# Patient Record
Sex: Female | Born: 1935 | Race: Black or African American | Hispanic: No | Smoking: Never smoker
Health system: Southern US, Community
[De-identification: ages and names within clinical notes are randomized; demographics above are authoritative.]

## PROBLEM LIST (undated history)

## (undated) DIAGNOSIS — I509 Heart failure, unspecified: Secondary | ICD-10-CM

## (undated) DIAGNOSIS — G4700 Insomnia, unspecified: Secondary | ICD-10-CM

## (undated) DIAGNOSIS — K219 Gastro-esophageal reflux disease without esophagitis: Secondary | ICD-10-CM

## (undated) DIAGNOSIS — I517 Cardiomegaly: Secondary | ICD-10-CM

## (undated) DIAGNOSIS — T7840XA Allergy, unspecified, initial encounter: Secondary | ICD-10-CM

## (undated) DIAGNOSIS — I10 Essential (primary) hypertension: Secondary | ICD-10-CM

## (undated) DIAGNOSIS — R42 Dizziness and giddiness: Secondary | ICD-10-CM

## (undated) HISTORY — DX: Dizziness and giddiness: R42

## (undated) HISTORY — DX: Essential (primary) hypertension: I10

## (undated) HISTORY — DX: Insomnia, unspecified: G47.00

## (undated) HISTORY — DX: Cardiomegaly: I51.7

## (undated) HISTORY — DX: Gastro-esophageal reflux disease without esophagitis: K21.9

## (undated) HISTORY — DX: Allergy, unspecified, initial encounter: T78.40XA

---

## 1998-04-14 DIAGNOSIS — I517 Cardiomegaly: Secondary | ICD-10-CM

## 1998-04-14 HISTORY — DX: Cardiomegaly: I51.7

## 2002-04-14 LAB — HM COLONOSCOPY: HM COLON: NORMAL

## 2004-04-14 DIAGNOSIS — R42 Dizziness and giddiness: Secondary | ICD-10-CM

## 2004-04-14 HISTORY — DX: Dizziness and giddiness: R42

## 2005-02-26 ENCOUNTER — Ambulatory Visit: Payer: Self-pay

## 2006-04-10 ENCOUNTER — Other Ambulatory Visit: Payer: Self-pay

## 2006-04-10 ENCOUNTER — Inpatient Hospital Stay: Payer: Self-pay | Admitting: *Deleted

## 2006-05-10 ENCOUNTER — Emergency Department: Payer: Self-pay | Admitting: Emergency Medicine

## 2006-05-10 ENCOUNTER — Other Ambulatory Visit: Payer: Self-pay

## 2006-07-12 ENCOUNTER — Emergency Department: Payer: Self-pay | Admitting: Internal Medicine

## 2008-01-13 ENCOUNTER — Ambulatory Visit: Payer: Self-pay | Admitting: Internal Medicine

## 2008-01-25 ENCOUNTER — Ambulatory Visit: Payer: Self-pay | Admitting: Internal Medicine

## 2008-02-13 ENCOUNTER — Ambulatory Visit: Payer: Self-pay | Admitting: Internal Medicine

## 2008-03-14 ENCOUNTER — Ambulatory Visit: Payer: Self-pay | Admitting: Internal Medicine

## 2008-04-14 ENCOUNTER — Ambulatory Visit: Payer: Self-pay | Admitting: Internal Medicine

## 2008-07-13 ENCOUNTER — Ambulatory Visit: Payer: Self-pay | Admitting: Internal Medicine

## 2008-07-19 ENCOUNTER — Ambulatory Visit: Payer: Self-pay | Admitting: Internal Medicine

## 2008-08-12 ENCOUNTER — Ambulatory Visit: Payer: Self-pay | Admitting: Internal Medicine

## 2009-04-14 LAB — HM DEXA SCAN

## 2009-05-07 ENCOUNTER — Ambulatory Visit: Payer: Self-pay | Admitting: General Practice

## 2010-04-15 ENCOUNTER — Emergency Department: Payer: Self-pay | Admitting: Emergency Medicine

## 2011-04-16 DIAGNOSIS — E538 Deficiency of other specified B group vitamins: Secondary | ICD-10-CM | POA: Diagnosis not present

## 2011-04-28 DIAGNOSIS — E538 Deficiency of other specified B group vitamins: Secondary | ICD-10-CM | POA: Diagnosis not present

## 2011-04-28 DIAGNOSIS — I10 Essential (primary) hypertension: Secondary | ICD-10-CM | POA: Diagnosis not present

## 2011-04-28 DIAGNOSIS — D638 Anemia in other chronic diseases classified elsewhere: Secondary | ICD-10-CM | POA: Diagnosis not present

## 2011-04-28 DIAGNOSIS — E785 Hyperlipidemia, unspecified: Secondary | ICD-10-CM | POA: Diagnosis not present

## 2011-05-19 DIAGNOSIS — I428 Other cardiomyopathies: Secondary | ICD-10-CM | POA: Diagnosis not present

## 2011-05-19 DIAGNOSIS — I422 Other hypertrophic cardiomyopathy: Secondary | ICD-10-CM | POA: Diagnosis not present

## 2011-05-19 DIAGNOSIS — R0602 Shortness of breath: Secondary | ICD-10-CM | POA: Diagnosis not present

## 2011-05-19 DIAGNOSIS — I4891 Unspecified atrial fibrillation: Secondary | ICD-10-CM | POA: Diagnosis not present

## 2011-06-03 DIAGNOSIS — E538 Deficiency of other specified B group vitamins: Secondary | ICD-10-CM | POA: Diagnosis not present

## 2011-06-24 DIAGNOSIS — E785 Hyperlipidemia, unspecified: Secondary | ICD-10-CM | POA: Diagnosis not present

## 2011-06-24 DIAGNOSIS — D638 Anemia in other chronic diseases classified elsewhere: Secondary | ICD-10-CM | POA: Diagnosis not present

## 2011-06-24 DIAGNOSIS — E538 Deficiency of other specified B group vitamins: Secondary | ICD-10-CM | POA: Diagnosis not present

## 2011-06-24 DIAGNOSIS — I10 Essential (primary) hypertension: Secondary | ICD-10-CM | POA: Diagnosis not present

## 2011-08-11 DIAGNOSIS — I10 Essential (primary) hypertension: Secondary | ICD-10-CM | POA: Diagnosis not present

## 2011-08-11 DIAGNOSIS — E785 Hyperlipidemia, unspecified: Secondary | ICD-10-CM | POA: Diagnosis not present

## 2011-08-11 DIAGNOSIS — E538 Deficiency of other specified B group vitamins: Secondary | ICD-10-CM | POA: Diagnosis not present

## 2011-08-11 DIAGNOSIS — K219 Gastro-esophageal reflux disease without esophagitis: Secondary | ICD-10-CM | POA: Diagnosis not present

## 2011-09-18 DIAGNOSIS — R109 Unspecified abdominal pain: Secondary | ICD-10-CM | POA: Diagnosis not present

## 2011-09-18 DIAGNOSIS — N9489 Other specified conditions associated with female genital organs and menstrual cycle: Secondary | ICD-10-CM | POA: Diagnosis not present

## 2011-09-18 DIAGNOSIS — Z7901 Long term (current) use of anticoagulants: Secondary | ICD-10-CM | POA: Diagnosis not present

## 2011-09-18 DIAGNOSIS — I10 Essential (primary) hypertension: Secondary | ICD-10-CM | POA: Diagnosis not present

## 2011-09-18 DIAGNOSIS — I4891 Unspecified atrial fibrillation: Secondary | ICD-10-CM | POA: Diagnosis not present

## 2011-09-18 DIAGNOSIS — I422 Other hypertrophic cardiomyopathy: Secondary | ICD-10-CM | POA: Diagnosis not present

## 2011-09-18 DIAGNOSIS — Z79899 Other long term (current) drug therapy: Secondary | ICD-10-CM | POA: Diagnosis not present

## 2011-09-23 DIAGNOSIS — E538 Deficiency of other specified B group vitamins: Secondary | ICD-10-CM | POA: Diagnosis not present

## 2011-10-20 DIAGNOSIS — E538 Deficiency of other specified B group vitamins: Secondary | ICD-10-CM | POA: Diagnosis not present

## 2011-11-12 DIAGNOSIS — N8111 Cystocele, midline: Secondary | ICD-10-CM | POA: Diagnosis not present

## 2011-11-17 DIAGNOSIS — E538 Deficiency of other specified B group vitamins: Secondary | ICD-10-CM | POA: Diagnosis not present

## 2011-11-17 DIAGNOSIS — I10 Essential (primary) hypertension: Secondary | ICD-10-CM | POA: Diagnosis not present

## 2011-11-17 DIAGNOSIS — G4700 Insomnia, unspecified: Secondary | ICD-10-CM | POA: Diagnosis not present

## 2011-11-17 DIAGNOSIS — I4891 Unspecified atrial fibrillation: Secondary | ICD-10-CM | POA: Diagnosis not present

## 2011-11-17 DIAGNOSIS — E785 Hyperlipidemia, unspecified: Secondary | ICD-10-CM | POA: Diagnosis not present

## 2011-11-24 ENCOUNTER — Encounter: Payer: Self-pay | Admitting: Obstetrics and Gynecology

## 2011-11-24 DIAGNOSIS — IMO0001 Reserved for inherently not codable concepts without codable children: Secondary | ICD-10-CM | POA: Diagnosis not present

## 2011-11-24 DIAGNOSIS — M25559 Pain in unspecified hip: Secondary | ICD-10-CM | POA: Diagnosis not present

## 2011-11-24 DIAGNOSIS — M6281 Muscle weakness (generalized): Secondary | ICD-10-CM | POA: Diagnosis not present

## 2011-11-26 DIAGNOSIS — IMO0001 Reserved for inherently not codable concepts without codable children: Secondary | ICD-10-CM | POA: Diagnosis not present

## 2011-11-26 DIAGNOSIS — M25559 Pain in unspecified hip: Secondary | ICD-10-CM | POA: Diagnosis not present

## 2011-11-26 DIAGNOSIS — M6281 Muscle weakness (generalized): Secondary | ICD-10-CM | POA: Diagnosis not present

## 2011-12-02 DIAGNOSIS — IMO0001 Reserved for inherently not codable concepts without codable children: Secondary | ICD-10-CM | POA: Diagnosis not present

## 2011-12-02 DIAGNOSIS — M6281 Muscle weakness (generalized): Secondary | ICD-10-CM | POA: Diagnosis not present

## 2011-12-02 DIAGNOSIS — M25559 Pain in unspecified hip: Secondary | ICD-10-CM | POA: Diagnosis not present

## 2011-12-04 DIAGNOSIS — M6281 Muscle weakness (generalized): Secondary | ICD-10-CM | POA: Diagnosis not present

## 2011-12-04 DIAGNOSIS — IMO0001 Reserved for inherently not codable concepts without codable children: Secondary | ICD-10-CM | POA: Diagnosis not present

## 2011-12-04 DIAGNOSIS — M25559 Pain in unspecified hip: Secondary | ICD-10-CM | POA: Diagnosis not present

## 2011-12-08 DIAGNOSIS — M25559 Pain in unspecified hip: Secondary | ICD-10-CM | POA: Diagnosis not present

## 2011-12-08 DIAGNOSIS — IMO0001 Reserved for inherently not codable concepts without codable children: Secondary | ICD-10-CM | POA: Diagnosis not present

## 2011-12-08 DIAGNOSIS — M6281 Muscle weakness (generalized): Secondary | ICD-10-CM | POA: Diagnosis not present

## 2011-12-10 DIAGNOSIS — IMO0001 Reserved for inherently not codable concepts without codable children: Secondary | ICD-10-CM | POA: Diagnosis not present

## 2011-12-10 DIAGNOSIS — M25559 Pain in unspecified hip: Secondary | ICD-10-CM | POA: Diagnosis not present

## 2011-12-10 DIAGNOSIS — M6281 Muscle weakness (generalized): Secondary | ICD-10-CM | POA: Diagnosis not present

## 2011-12-14 ENCOUNTER — Encounter: Payer: Self-pay | Admitting: Obstetrics and Gynecology

## 2011-12-14 DIAGNOSIS — M25559 Pain in unspecified hip: Secondary | ICD-10-CM | POA: Diagnosis not present

## 2011-12-14 DIAGNOSIS — M6281 Muscle weakness (generalized): Secondary | ICD-10-CM | POA: Diagnosis not present

## 2011-12-14 DIAGNOSIS — IMO0001 Reserved for inherently not codable concepts without codable children: Secondary | ICD-10-CM | POA: Diagnosis not present

## 2011-12-18 DIAGNOSIS — E538 Deficiency of other specified B group vitamins: Secondary | ICD-10-CM | POA: Diagnosis not present

## 2012-01-19 DIAGNOSIS — E538 Deficiency of other specified B group vitamins: Secondary | ICD-10-CM | POA: Diagnosis not present

## 2012-01-19 DIAGNOSIS — Z23 Encounter for immunization: Secondary | ICD-10-CM | POA: Diagnosis not present

## 2012-02-19 DIAGNOSIS — E538 Deficiency of other specified B group vitamins: Secondary | ICD-10-CM | POA: Diagnosis not present

## 2012-02-19 DIAGNOSIS — I10 Essential (primary) hypertension: Secondary | ICD-10-CM | POA: Diagnosis not present

## 2012-02-19 DIAGNOSIS — E785 Hyperlipidemia, unspecified: Secondary | ICD-10-CM | POA: Diagnosis not present

## 2012-02-19 DIAGNOSIS — N949 Unspecified condition associated with female genital organs and menstrual cycle: Secondary | ICD-10-CM | POA: Diagnosis not present

## 2012-02-25 DIAGNOSIS — N3941 Urge incontinence: Secondary | ICD-10-CM | POA: Diagnosis not present

## 2012-02-25 DIAGNOSIS — N8111 Cystocele, midline: Secondary | ICD-10-CM | POA: Diagnosis not present

## 2012-04-14 HISTORY — PX: BLADDER REPAIR: SHX76

## 2012-04-15 DIAGNOSIS — N8111 Cystocele, midline: Secondary | ICD-10-CM | POA: Diagnosis not present

## 2012-04-15 DIAGNOSIS — N3941 Urge incontinence: Secondary | ICD-10-CM | POA: Diagnosis not present

## 2012-04-15 DIAGNOSIS — IMO0002 Reserved for concepts with insufficient information to code with codable children: Secondary | ICD-10-CM | POA: Insufficient documentation

## 2012-04-21 DIAGNOSIS — E538 Deficiency of other specified B group vitamins: Secondary | ICD-10-CM | POA: Diagnosis not present

## 2012-05-11 DIAGNOSIS — N390 Urinary tract infection, site not specified: Secondary | ICD-10-CM | POA: Diagnosis not present

## 2012-05-11 DIAGNOSIS — Z01818 Encounter for other preprocedural examination: Secondary | ICD-10-CM | POA: Diagnosis not present

## 2012-05-21 DIAGNOSIS — E538 Deficiency of other specified B group vitamins: Secondary | ICD-10-CM | POA: Diagnosis not present

## 2012-05-25 DIAGNOSIS — N812 Incomplete uterovaginal prolapse: Secondary | ICD-10-CM | POA: Diagnosis not present

## 2012-05-25 DIAGNOSIS — N815 Vaginal enterocele: Secondary | ICD-10-CM | POA: Diagnosis not present

## 2012-05-25 DIAGNOSIS — N8110 Cystocele, unspecified: Secondary | ICD-10-CM | POA: Diagnosis not present

## 2012-06-21 DIAGNOSIS — G4700 Insomnia, unspecified: Secondary | ICD-10-CM | POA: Diagnosis not present

## 2012-06-21 DIAGNOSIS — I10 Essential (primary) hypertension: Secondary | ICD-10-CM | POA: Diagnosis not present

## 2012-06-21 DIAGNOSIS — E538 Deficiency of other specified B group vitamins: Secondary | ICD-10-CM | POA: Diagnosis not present

## 2012-07-16 DIAGNOSIS — E538 Deficiency of other specified B group vitamins: Secondary | ICD-10-CM | POA: Diagnosis not present

## 2012-09-02 DIAGNOSIS — E538 Deficiency of other specified B group vitamins: Secondary | ICD-10-CM | POA: Diagnosis not present

## 2012-09-15 DIAGNOSIS — E538 Deficiency of other specified B group vitamins: Secondary | ICD-10-CM | POA: Diagnosis not present

## 2012-09-16 DIAGNOSIS — I4891 Unspecified atrial fibrillation: Secondary | ICD-10-CM | POA: Diagnosis not present

## 2012-09-16 DIAGNOSIS — I10 Essential (primary) hypertension: Secondary | ICD-10-CM | POA: Diagnosis not present

## 2012-09-16 DIAGNOSIS — Z79899 Other long term (current) drug therapy: Secondary | ICD-10-CM | POA: Diagnosis not present

## 2012-09-16 DIAGNOSIS — I422 Other hypertrophic cardiomyopathy: Secondary | ICD-10-CM | POA: Diagnosis not present

## 2012-09-23 DIAGNOSIS — E785 Hyperlipidemia, unspecified: Secondary | ICD-10-CM | POA: Diagnosis not present

## 2012-09-23 DIAGNOSIS — K219 Gastro-esophageal reflux disease without esophagitis: Secondary | ICD-10-CM | POA: Diagnosis not present

## 2012-09-23 DIAGNOSIS — I10 Essential (primary) hypertension: Secondary | ICD-10-CM | POA: Diagnosis not present

## 2012-09-23 DIAGNOSIS — E538 Deficiency of other specified B group vitamins: Secondary | ICD-10-CM | POA: Diagnosis not present

## 2012-11-04 DIAGNOSIS — E538 Deficiency of other specified B group vitamins: Secondary | ICD-10-CM | POA: Diagnosis not present

## 2012-11-25 DIAGNOSIS — E538 Deficiency of other specified B group vitamins: Secondary | ICD-10-CM | POA: Diagnosis not present

## 2012-12-27 DIAGNOSIS — Z23 Encounter for immunization: Secondary | ICD-10-CM | POA: Diagnosis not present

## 2012-12-27 DIAGNOSIS — Z9181 History of falling: Secondary | ICD-10-CM | POA: Diagnosis not present

## 2012-12-27 DIAGNOSIS — E538 Deficiency of other specified B group vitamins: Secondary | ICD-10-CM | POA: Diagnosis not present

## 2012-12-27 DIAGNOSIS — Z1331 Encounter for screening for depression: Secondary | ICD-10-CM | POA: Diagnosis not present

## 2012-12-27 DIAGNOSIS — D649 Anemia, unspecified: Secondary | ICD-10-CM | POA: Diagnosis not present

## 2012-12-27 DIAGNOSIS — E785 Hyperlipidemia, unspecified: Secondary | ICD-10-CM | POA: Diagnosis not present

## 2012-12-27 DIAGNOSIS — Z862 Personal history of diseases of the blood and blood-forming organs and certain disorders involving the immune mechanism: Secondary | ICD-10-CM | POA: Diagnosis not present

## 2012-12-27 DIAGNOSIS — I10 Essential (primary) hypertension: Secondary | ICD-10-CM | POA: Diagnosis not present

## 2012-12-27 DIAGNOSIS — R195 Other fecal abnormalities: Secondary | ICD-10-CM | POA: Diagnosis not present

## 2013-01-14 DIAGNOSIS — M899 Disorder of bone, unspecified: Secondary | ICD-10-CM | POA: Diagnosis not present

## 2013-01-14 DIAGNOSIS — M7730 Calcaneal spur, unspecified foot: Secondary | ICD-10-CM | POA: Diagnosis not present

## 2013-01-14 DIAGNOSIS — M2010 Hallux valgus (acquired), unspecified foot: Secondary | ICD-10-CM | POA: Diagnosis not present

## 2013-01-14 DIAGNOSIS — M7989 Other specified soft tissue disorders: Secondary | ICD-10-CM | POA: Diagnosis not present

## 2013-01-14 DIAGNOSIS — M2040 Other hammer toe(s) (acquired), unspecified foot: Secondary | ICD-10-CM | POA: Diagnosis not present

## 2013-01-14 DIAGNOSIS — M79609 Pain in unspecified limb: Secondary | ICD-10-CM | POA: Diagnosis not present

## 2013-01-14 DIAGNOSIS — S93336A Other dislocation of unspecified foot, initial encounter: Secondary | ICD-10-CM | POA: Diagnosis not present

## 2013-01-28 DIAGNOSIS — E538 Deficiency of other specified B group vitamins: Secondary | ICD-10-CM | POA: Diagnosis not present

## 2013-02-14 DIAGNOSIS — M2010 Hallux valgus (acquired), unspecified foot: Secondary | ICD-10-CM | POA: Diagnosis not present

## 2013-02-14 DIAGNOSIS — R51 Headache: Secondary | ICD-10-CM | POA: Diagnosis not present

## 2013-02-14 DIAGNOSIS — M19079 Primary osteoarthritis, unspecified ankle and foot: Secondary | ICD-10-CM | POA: Diagnosis not present

## 2013-02-14 DIAGNOSIS — M2040 Other hammer toe(s) (acquired), unspecified foot: Secondary | ICD-10-CM | POA: Diagnosis not present

## 2013-04-15 DIAGNOSIS — E538 Deficiency of other specified B group vitamins: Secondary | ICD-10-CM | POA: Diagnosis not present

## 2013-05-27 DIAGNOSIS — E538 Deficiency of other specified B group vitamins: Secondary | ICD-10-CM | POA: Diagnosis not present

## 2013-07-07 DIAGNOSIS — K219 Gastro-esophageal reflux disease without esophagitis: Secondary | ICD-10-CM | POA: Diagnosis not present

## 2013-07-07 DIAGNOSIS — G4700 Insomnia, unspecified: Secondary | ICD-10-CM | POA: Diagnosis not present

## 2013-07-07 DIAGNOSIS — E785 Hyperlipidemia, unspecified: Secondary | ICD-10-CM | POA: Diagnosis not present

## 2013-07-07 DIAGNOSIS — E538 Deficiency of other specified B group vitamins: Secondary | ICD-10-CM | POA: Diagnosis not present

## 2013-09-12 DIAGNOSIS — E538 Deficiency of other specified B group vitamins: Secondary | ICD-10-CM | POA: Diagnosis not present

## 2013-09-15 DIAGNOSIS — R0989 Other specified symptoms and signs involving the circulatory and respiratory systems: Secondary | ICD-10-CM | POA: Diagnosis not present

## 2013-09-15 DIAGNOSIS — I422 Other hypertrophic cardiomyopathy: Secondary | ICD-10-CM | POA: Diagnosis not present

## 2013-09-15 DIAGNOSIS — R0609 Other forms of dyspnea: Secondary | ICD-10-CM | POA: Diagnosis not present

## 2013-10-24 DIAGNOSIS — K219 Gastro-esophageal reflux disease without esophagitis: Secondary | ICD-10-CM | POA: Diagnosis not present

## 2013-10-24 DIAGNOSIS — Z79899 Other long term (current) drug therapy: Secondary | ICD-10-CM | POA: Diagnosis not present

## 2013-10-24 DIAGNOSIS — E538 Deficiency of other specified B group vitamins: Secondary | ICD-10-CM | POA: Diagnosis not present

## 2013-10-24 DIAGNOSIS — G4700 Insomnia, unspecified: Secondary | ICD-10-CM | POA: Diagnosis not present

## 2013-10-24 DIAGNOSIS — E785 Hyperlipidemia, unspecified: Secondary | ICD-10-CM | POA: Diagnosis not present

## 2013-10-24 DIAGNOSIS — D649 Anemia, unspecified: Secondary | ICD-10-CM | POA: Diagnosis not present

## 2013-10-24 DIAGNOSIS — I10 Essential (primary) hypertension: Secondary | ICD-10-CM | POA: Diagnosis not present

## 2013-10-24 LAB — LIPID PANEL
Cholesterol: 164 mg/dL (ref 0–200)
HDL: 79 mg/dL — AB (ref 35–70)
LDL Cholesterol: 75 mg/dL
TRIGLYCERIDES: 49 mg/dL (ref 40–160)

## 2013-11-11 DIAGNOSIS — E538 Deficiency of other specified B group vitamins: Secondary | ICD-10-CM | POA: Diagnosis not present

## 2013-12-23 DIAGNOSIS — E785 Hyperlipidemia, unspecified: Secondary | ICD-10-CM | POA: Diagnosis not present

## 2013-12-23 DIAGNOSIS — I10 Essential (primary) hypertension: Secondary | ICD-10-CM | POA: Diagnosis not present

## 2013-12-23 DIAGNOSIS — G4700 Insomnia, unspecified: Secondary | ICD-10-CM | POA: Diagnosis not present

## 2013-12-23 DIAGNOSIS — K219 Gastro-esophageal reflux disease without esophagitis: Secondary | ICD-10-CM | POA: Diagnosis not present

## 2014-01-24 DIAGNOSIS — N3941 Urge incontinence: Secondary | ICD-10-CM | POA: Diagnosis not present

## 2014-01-24 DIAGNOSIS — Z23 Encounter for immunization: Secondary | ICD-10-CM | POA: Diagnosis not present

## 2014-01-24 DIAGNOSIS — R350 Frequency of micturition: Secondary | ICD-10-CM | POA: Diagnosis not present

## 2014-01-24 DIAGNOSIS — I482 Chronic atrial fibrillation: Secondary | ICD-10-CM | POA: Diagnosis not present

## 2014-01-24 DIAGNOSIS — F5104 Psychophysiologic insomnia: Secondary | ICD-10-CM | POA: Diagnosis not present

## 2014-01-24 DIAGNOSIS — I272 Other secondary pulmonary hypertension: Secondary | ICD-10-CM | POA: Diagnosis not present

## 2014-01-24 DIAGNOSIS — I429 Cardiomyopathy, unspecified: Secondary | ICD-10-CM | POA: Diagnosis not present

## 2014-01-24 DIAGNOSIS — E538 Deficiency of other specified B group vitamins: Secondary | ICD-10-CM | POA: Diagnosis not present

## 2014-05-29 DIAGNOSIS — D649 Anemia, unspecified: Secondary | ICD-10-CM | POA: Diagnosis not present

## 2014-05-29 DIAGNOSIS — E538 Deficiency of other specified B group vitamins: Secondary | ICD-10-CM | POA: Diagnosis not present

## 2014-05-29 DIAGNOSIS — E785 Hyperlipidemia, unspecified: Secondary | ICD-10-CM | POA: Diagnosis not present

## 2014-05-29 DIAGNOSIS — I10 Essential (primary) hypertension: Secondary | ICD-10-CM | POA: Diagnosis not present

## 2014-05-29 DIAGNOSIS — I482 Chronic atrial fibrillation: Secondary | ICD-10-CM | POA: Diagnosis not present

## 2014-05-29 DIAGNOSIS — K219 Gastro-esophageal reflux disease without esophagitis: Secondary | ICD-10-CM | POA: Diagnosis not present

## 2014-05-29 DIAGNOSIS — J309 Allergic rhinitis, unspecified: Secondary | ICD-10-CM | POA: Diagnosis not present

## 2014-05-29 DIAGNOSIS — M779 Enthesopathy, unspecified: Secondary | ICD-10-CM | POA: Diagnosis not present

## 2014-05-29 DIAGNOSIS — F5104 Psychophysiologic insomnia: Secondary | ICD-10-CM | POA: Diagnosis not present

## 2014-07-14 DIAGNOSIS — E538 Deficiency of other specified B group vitamins: Secondary | ICD-10-CM | POA: Diagnosis not present

## 2014-09-08 DIAGNOSIS — E538 Deficiency of other specified B group vitamins: Secondary | ICD-10-CM | POA: Diagnosis not present

## 2014-09-27 ENCOUNTER — Encounter: Payer: Self-pay | Admitting: Family Medicine

## 2014-09-27 DIAGNOSIS — R600 Localized edema: Secondary | ICD-10-CM | POA: Insufficient documentation

## 2014-09-27 DIAGNOSIS — G4700 Insomnia, unspecified: Secondary | ICD-10-CM | POA: Insufficient documentation

## 2014-09-27 DIAGNOSIS — M779 Enthesopathy, unspecified: Secondary | ICD-10-CM

## 2014-09-27 DIAGNOSIS — I10 Essential (primary) hypertension: Secondary | ICD-10-CM | POA: Insufficient documentation

## 2014-09-27 DIAGNOSIS — K5730 Diverticulosis of large intestine without perforation or abscess without bleeding: Secondary | ICD-10-CM | POA: Insufficient documentation

## 2014-09-27 DIAGNOSIS — D649 Anemia, unspecified: Secondary | ICD-10-CM | POA: Insufficient documentation

## 2014-09-27 DIAGNOSIS — N812 Incomplete uterovaginal prolapse: Secondary | ICD-10-CM | POA: Insufficient documentation

## 2014-09-27 DIAGNOSIS — R42 Dizziness and giddiness: Secondary | ICD-10-CM | POA: Insufficient documentation

## 2014-09-27 DIAGNOSIS — H9193 Unspecified hearing loss, bilateral: Secondary | ICD-10-CM | POA: Insufficient documentation

## 2014-09-27 DIAGNOSIS — K219 Gastro-esophageal reflux disease without esophagitis: Secondary | ICD-10-CM | POA: Insufficient documentation

## 2014-09-27 DIAGNOSIS — D510 Vitamin B12 deficiency anemia due to intrinsic factor deficiency: Secondary | ICD-10-CM | POA: Insufficient documentation

## 2014-09-27 DIAGNOSIS — N3941 Urge incontinence: Secondary | ICD-10-CM | POA: Insufficient documentation

## 2014-09-27 DIAGNOSIS — M778 Other enthesopathies, not elsewhere classified: Secondary | ICD-10-CM | POA: Insufficient documentation

## 2014-09-27 DIAGNOSIS — J3089 Other allergic rhinitis: Secondary | ICD-10-CM | POA: Insufficient documentation

## 2014-09-27 DIAGNOSIS — E785 Hyperlipidemia, unspecified: Secondary | ICD-10-CM | POA: Insufficient documentation

## 2014-09-27 DIAGNOSIS — M8580 Other specified disorders of bone density and structure, unspecified site: Secondary | ICD-10-CM | POA: Insufficient documentation

## 2014-09-27 DIAGNOSIS — D509 Iron deficiency anemia, unspecified: Secondary | ICD-10-CM | POA: Insufficient documentation

## 2014-09-27 DIAGNOSIS — Z79899 Other long term (current) drug therapy: Secondary | ICD-10-CM | POA: Insufficient documentation

## 2014-09-27 DIAGNOSIS — R102 Pelvic and perineal pain: Secondary | ICD-10-CM | POA: Insufficient documentation

## 2014-09-27 DIAGNOSIS — N814 Uterovaginal prolapse, unspecified: Secondary | ICD-10-CM | POA: Insufficient documentation

## 2014-09-27 DIAGNOSIS — E559 Vitamin D deficiency, unspecified: Secondary | ICD-10-CM | POA: Insufficient documentation

## 2014-09-27 DIAGNOSIS — I340 Nonrheumatic mitral (valve) insufficiency: Secondary | ICD-10-CM | POA: Insufficient documentation

## 2014-09-27 DIAGNOSIS — E538 Deficiency of other specified B group vitamins: Secondary | ICD-10-CM | POA: Insufficient documentation

## 2014-09-27 DIAGNOSIS — M25519 Pain in unspecified shoulder: Secondary | ICD-10-CM | POA: Insufficient documentation

## 2014-09-27 DIAGNOSIS — K648 Other hemorrhoids: Secondary | ICD-10-CM | POA: Insufficient documentation

## 2014-09-27 DIAGNOSIS — I480 Paroxysmal atrial fibrillation: Secondary | ICD-10-CM | POA: Insufficient documentation

## 2014-09-27 DIAGNOSIS — I272 Pulmonary hypertension, unspecified: Secondary | ICD-10-CM | POA: Insufficient documentation

## 2014-09-28 ENCOUNTER — Encounter (INDEPENDENT_AMBULATORY_CARE_PROVIDER_SITE_OTHER): Payer: Self-pay

## 2014-09-28 ENCOUNTER — Encounter: Payer: Self-pay | Admitting: Family Medicine

## 2014-09-28 ENCOUNTER — Ambulatory Visit (INDEPENDENT_AMBULATORY_CARE_PROVIDER_SITE_OTHER): Payer: Medicare Other | Admitting: Family Medicine

## 2014-09-28 VITALS — BP 128/70 | HR 65 | Temp 98.2°F | Resp 14 | Ht 65.0 in | Wt 165.0 lb

## 2014-09-28 DIAGNOSIS — J309 Allergic rhinitis, unspecified: Secondary | ICD-10-CM | POA: Diagnosis not present

## 2014-09-28 DIAGNOSIS — R1031 Right lower quadrant pain: Secondary | ICD-10-CM | POA: Diagnosis not present

## 2014-09-28 DIAGNOSIS — D510 Vitamin B12 deficiency anemia due to intrinsic factor deficiency: Secondary | ICD-10-CM | POA: Diagnosis not present

## 2014-09-28 DIAGNOSIS — Z9181 History of falling: Secondary | ICD-10-CM | POA: Diagnosis not present

## 2014-09-28 DIAGNOSIS — I10 Essential (primary) hypertension: Secondary | ICD-10-CM

## 2014-09-28 DIAGNOSIS — I482 Chronic atrial fibrillation: Secondary | ICD-10-CM | POA: Diagnosis not present

## 2014-09-28 DIAGNOSIS — E785 Hyperlipidemia, unspecified: Secondary | ICD-10-CM | POA: Diagnosis not present

## 2014-09-28 DIAGNOSIS — D649 Anemia, unspecified: Secondary | ICD-10-CM

## 2014-09-28 DIAGNOSIS — Z79899 Other long term (current) drug therapy: Secondary | ICD-10-CM

## 2014-09-28 DIAGNOSIS — I4821 Permanent atrial fibrillation: Secondary | ICD-10-CM

## 2014-09-28 DIAGNOSIS — J3089 Other allergic rhinitis: Secondary | ICD-10-CM

## 2014-09-28 MED ORDER — FLUTICASONE PROPIONATE 50 MCG/ACT NA SUSP: 2.0000 | Freq: Every day | NASAL | Status: DC

## 2014-09-28 NOTE — Progress Notes (Signed)
Name: Sharon Bullock   MRN: [id number]    DOB: November 18, 1933   Date:09/28/2014       Progress Note  Subjective  Chief Complaint  Chief Complaint  Patient presents with  . Hypertension  . Hyperlipidemia    muscle pain  . Insomnia    avg sleep 5 hrs  . Nasal Congestion    chest imrproving 2 months  . Abdominal Pain    RLQ 1 month improving dull ache    HPI  HTN: she takes medications as prescribed, denies side effects except for mild lower extremity swelling. No chest pain, no palpitation  Hyperlipidemia: she is due for repeat labs, she is taking Atorvastatin , denies side effects  Insomnia: she states Ambien is working well for her, occasionally has difficulty falling asleep when stressed.  Post-nasal drainage: she has a long history of AR and has noticed increasing in post-nasal drainage. She has a nasal spray and claritin at home but she has not been using either one of them. Drainage is clear to white color. No fever, no wheezing or SBO.  Abdomina pain: she was on top of a stool to grab something from the pantry, and fell down, did not hip the floor, but the hit the door. She also has been working on a floor bed and right leg and RLQ abdominal pain since than. She denies change in bowel movement, blood or mucus in stools, no change in appetite  Patient Active Problem List   Diagnosis Date Noted  . Pernicious anemia 09/27/2014  . Atrial fibrillation, permanent 09/27/2014  . Benign essential HTN 09/27/2014  . Allergic rhinitis 09/27/2014  . Chronic anemia 09/27/2014  . Insomnia, persistent 09/27/2014  . Hypertensive pulmonary vascular disease 09/27/2014  . B12 deficiency 09/27/2014  . Diverticulosis of colon 09/27/2014  . Dyslipidemia 09/27/2014  . Edema extremities 09/27/2014  . Gastric reflux 09/27/2014  . Bilateral hearing loss 09/27/2014  . Polypharmacy 09/27/2014  . Pelvic pain in female 09/27/2014  . MI (mitral incompetence) 09/27/2014  . Internal hemorrhoids  09/27/2014  . Osteopenia 09/27/2014  . Arthralgia of shoulder 09/27/2014  . Finger tendinitis 09/27/2014  . Urge incontinence 09/27/2014  . Cervical prolapse 09/27/2014  . Vertigo 09/27/2014  . Vitamin D deficiency 09/27/2014  . Bladder cystocele 04/15/2012  . Asymmetric septal hypertrophy 05/19/2011    Past Surgical History  Procedure Laterality Date  . Bladder repair  2014    tact    Family History  Problem Relation Age of Onset  . Alzheimer's disease Mother   . Hypertension Mother   . Stroke Father   . Heart disease Father   . Heart disease Brother   . Hypertension Brother   . Diabetes Brother   . Cancer Daughter     breast  . Hypertension Daughter     History   Social History  . Marital Status: Married    Spouse Name: N/A  . Number of Children: N/A  . Years of Education: N/A   Occupational History  . Not on file.   Social History Main Topics  . Smoking status: Never Smoker   . Smokeless tobacco: Never Used  . Alcohol Use: No  . Drug Use: No  . Sexual Activity: Not Currently   Other Topics Concern  . Not on file   Social History Narrative  . No narrative on file     Current outpatient prescriptions:  .  amiodarone (PACERONE) 200 MG tablet, Take 1 tablet by mouth daily., Disp: , Rfl:  .  amLODipine (NORVASC) 10 MG tablet, Take 1 tablet by mouth daily., Disp: , Rfl:  .  atorvastatin (LIPITOR) 80 MG tablet, Take 1 tablet by mouth as needed., Disp: , Rfl:  .  carvedilol (COREG) 25 MG tablet, Take 1 tablet by mouth 2 (two) times daily., Disp: , Rfl:  .  folic acid (FOLVITE) 400 MCG tablet, Take 1 tablet by mouth daily., Disp: , Rfl:  .  gabapentin (NEURONTIN) 300 MG capsule, Take 1 capsule by mouth daily., Disp: , Rfl:  .  irbesartan (AVAPRO) 300 MG tablet, Take 1 tablet by mouth daily., Disp: , Rfl:  .  loratadine (CLARITIN) 10 MG tablet, Take 1 tablet by mouth as needed., Disp: , Rfl:  .  PRADAXA 150 MG CAPS capsule, Take 1 tablet by mouth 2 (two)  times daily., Disp: , Rfl:  .  ranitidine (ZANTAC) 150 MG tablet, Take 1 tablet by mouth as needed., Disp: , Rfl:  .  Vitamin D, Cholecalciferol, 1000 UNITS TABS, Take 1 tablet by mouth daily., Disp: , Rfl:  .  zolpidem (AMBIEN) 10 MG tablet, Take 1 tablet by mouth at bedtime., Disp: , Rfl:   Allergies  Allergen Reactions  . Nickel Dermatitis and Swelling  . Other Anaphylaxis    Uncoded Allergy. Allergen: SHELLFISH  . Ace Inhibitors Cough     ROS  Constitutional: Negative for fever or weight change.  Respiratory: Negative for cough and shortness of breath  HEENT: postnasal drainage and nasal congestion   Cardiovascular: Negative for chest pain or palpitations. Lower extremity edema Gastrointestinal: positive  for abdominal pain, no bowel changes.  Musculoskeletal: Negative for gait problem or joint swelling.  Skin: Negative for rash.  Neurological: Negative for dizziness or headache.  No other specific complaints in a complete review of systems (except as listed in HPI above).   Objective  Filed Vitals:   09/28/14 1008  BP: 128/70  Pulse: 65  Temp: 98.2 F (36.8 C)  TempSrc: Oral  Resp: 14  Height:  (1.651 m)  Weight: 165 lb (74.844 kg)  SpO2: 98%    Body mass index is 27.46 kg/(m^2).  Physical Exam   Constitutional: Patient appears well-developed and well-nourished. No distress.  HENT: Head: Normocephalic and atraumatic. Nose: Boggy turbinates, no tenderness during percussion of sinusis. Mouth/Throat: Oropharynx is clear and moist. No oropharyngeal exudate.  Eyes: Conjunctivae and EOM are normal. Pupils are equal, round, and reactive to light. No scleral icterus.  Neck: Normal range of motion. Neck supple. No JVD present. No thyromegaly present.  Cardiovascular: Normal rate, regular rhythm , systolic holosystolic murmur 3/6. No BLE edema. Pulmonary/Chest: Effort normal and breath sounds normal. No respiratory distress. Abdominal: Soft. Bowel sounds are  normal, no distension. There is  no masses. Point tenderness on RLQ - reproducible with palpation, also with leg elevation, seems muscular  Musculoskeletal: Normal range of motion, no joint effusions. No gross deformities Neurological: he is alert and oriented to person, place, and time. No cranial nerve deficit. Coordination, balance, strength, speech and gait are normal.  Skin: Skin is warm and dry. No rash noted. No erythema.  Psychiatric: Patient has a normal mood and affect. behavior is normal. Judgment and thought content normal. PHQ2/9: Depression screen PHQ 2/9 09/28/2014  Decreased Interest 0  Down, Depressed, Hopeless 0  PHQ - 2 Score 0     Fall Risk: Fall Risk  09/28/2014  Falls in the past year? Yes  Number falls in past yr: 1  Injury with Fall? No  Assessment & Plan    1. Benign essential HTN At goal, continue medication - Comprehensive Metabolic Panel (CMET)  2. History of recent fall Avoid climbing stools/ladders , night light, remove lose rugs  3. Abdominal wall pain in right lower quadrant Getting better, reassurance, use a stool to work on the yard 4. Polypharmacy Reviewed medications  5. Dyslipidemia  - Lipid Profile  6. Chronic anemia Continue Ambien  7. Pernicious anemia Continue B12 injections  8. Atrial fibrillation, permanent Stable, rhythm controlled   9. Perennial allergic rhinitis Resume medications - fluticasone (FLONASE) 50 MCG/ACT nasal spray; Place 2 sprays into both nostrils daily.  Dispense: 16 g; Refill: 5

## 2014-09-29 LAB — COMPREHENSIVE METABOLIC PANEL
A/G RATIO: 2 (ref 1.1–2.5)
ALT: 15 IU/L (ref 0–32)
AST: 24 IU/L (ref 0–40)
Albumin: 4.6 g/dL (ref 3.5–4.8)
Alkaline Phosphatase: 73 IU/L (ref 39–117)
BUN / CREAT RATIO: 19 (ref 11–26)
BUN: 19 mg/dL (ref 8–27)
Bilirubin Total: 0.4 mg/dL (ref 0.0–1.2)
CO2: 25 mmol/L (ref 18–29)
Calcium: 9.5 mg/dL (ref 8.7–10.3)
Chloride: 104 mmol/L (ref 97–108)
Creatinine, Ser: 1 mg/dL (ref 0.57–1.00)
GFR, EST AFRICAN AMERICAN: 62 mL/min/{1.73_m2} (ref >59)
GFR, EST NON AFRICAN AMERICAN: 54 mL/min/{1.73_m2} — AB (ref >59)
GLOBULIN, TOTAL: 2.3 g/dL (ref 1.5–4.5)
Glucose: 83 mg/dL (ref 65–99)
Potassium: 4.4 mmol/L (ref 3.5–5.2)
Sodium: 142 mmol/L (ref 134–144)
Total Protein: 6.9 g/dL (ref 6.0–8.5)

## 2014-09-29 LAB — LIPID PANEL
CHOLESTEROL TOTAL: 169 mg/dL (ref 100–199)
Chol/HDL Ratio: 2.1 ratio units (ref 0.0–4.4)
HDL: 79 mg/dL (ref >39)
LDL Calculated: 79 mg/dL (ref 0–99)
Triglycerides: 53 mg/dL (ref 0–149)
VLDL Cholesterol Cal: 11 mg/dL (ref 5–40)

## 2014-11-18 ENCOUNTER — Other Ambulatory Visit: Payer: Self-pay | Admitting: Family Medicine

## 2014-11-22 ENCOUNTER — Other Ambulatory Visit: Payer: Self-pay | Admitting: Family Medicine

## 2014-12-08 ENCOUNTER — Other Ambulatory Visit: Payer: Self-pay | Admitting: Family Medicine

## 2014-12-08 ENCOUNTER — Ambulatory Visit (INDEPENDENT_AMBULATORY_CARE_PROVIDER_SITE_OTHER): Payer: Medicare Other

## 2014-12-08 DIAGNOSIS — E538 Deficiency of other specified B group vitamins: Secondary | ICD-10-CM

## 2014-12-08 MED ORDER — CYANOCOBALAMIN 1000 MCG/ML IJ SOLN
1000.0000 ug | Freq: Once | INTRAMUSCULAR | Status: AC
  Administered 2014-12-08: 1000 ug via INTRAMUSCULAR

## 2014-12-28 DIAGNOSIS — R0609 Other forms of dyspnea: Secondary | ICD-10-CM | POA: Diagnosis not present

## 2014-12-28 DIAGNOSIS — I422 Other hypertrophic cardiomyopathy: Secondary | ICD-10-CM | POA: Diagnosis not present

## 2015-01-03 ENCOUNTER — Ambulatory Visit (INDEPENDENT_AMBULATORY_CARE_PROVIDER_SITE_OTHER): Payer: Medicare Other | Admitting: Family Medicine

## 2015-01-03 ENCOUNTER — Encounter: Payer: Self-pay | Admitting: Family Medicine

## 2015-01-03 VITALS — BP 126/64 | HR 69 | Temp 98.0°F | Resp 16 | Ht 65.0 in | Wt 165.1 lb

## 2015-01-03 DIAGNOSIS — E538 Deficiency of other specified B group vitamins: Secondary | ICD-10-CM

## 2015-01-03 DIAGNOSIS — Z23 Encounter for immunization: Secondary | ICD-10-CM | POA: Diagnosis not present

## 2015-01-03 DIAGNOSIS — J069 Acute upper respiratory infection, unspecified: Secondary | ICD-10-CM | POA: Diagnosis not present

## 2015-01-03 MED ORDER — CYANOCOBALAMIN 1000 MCG/ML IJ SOLN
1000.0000 ug | Freq: Once | INTRAMUSCULAR | Status: AC
  Administered 2015-01-03: 1000 ug via INTRAMUSCULAR

## 2015-01-03 MED ORDER — PREDNISONE 10 MG PO TABS: 10.0000 mg | ORAL_TABLET | Freq: Every day | ORAL | Status: DC

## 2015-01-03 MED ORDER — BENZONATATE 100 MG PO CAPS: 200.0000 mg | ORAL_CAPSULE | Freq: Three times a day (TID) | ORAL | Status: DC | PRN

## 2015-01-03 NOTE — Progress Notes (Signed)
Name: Sharon Bullock   MRN: [id number]    DOB: 1933/10/28   Date:01/03/2015       Progress Note  Subjective  Chief Complaint  Chief Complaint  Patient presents with  . URI    onset 1 week coughing,sneezing,hoarsness,congestion taking OTC meds    HPI  URI: she developed cold symptoms one week ago. Initially with clear rhinorrhea, nasal congestion, scratchy throat, followed by a dry cough, and sometimes clear/white sputum. She also has noticed some hoarseness.  She states appetite has decreased. No fever, but has chills sometimes.  She will be interviewed by a [employer] on Saturday, her daughter is being recognized for being the Technical sales engineer of the first [employer] in [place].  President Obama and President Antonieta Loveless will be there. She states she cannot be sick for that day.   B12 Deficiency: she comes in monthly for B12 injection   Patient Active Problem List   Diagnosis Date Noted  . Pernicious anemia 09/27/2014  . Atrial fibrillation, permanent 09/27/2014  . Benign essential HTN 09/27/2014  . Perennial allergic rhinitis 09/27/2014  . Chronic anemia 09/27/2014  . Insomnia, persistent 09/27/2014  . Hypertensive pulmonary vascular disease 09/27/2014  . B12 deficiency 09/27/2014  . Diverticulosis of colon 09/27/2014  . Dyslipidemia 09/27/2014  . Edema extremities 09/27/2014  . Gastric reflux 09/27/2014  . Bilateral hearing loss 09/27/2014  . Polypharmacy 09/27/2014  . Pelvic pain in female 09/27/2014  . MI (mitral incompetence) 09/27/2014  . Internal hemorrhoids 09/27/2014  . Osteopenia 09/27/2014  . Arthralgia of shoulder 09/27/2014  . Finger tendinitis 09/27/2014  . Urge incontinence 09/27/2014  . Cervical prolapse 09/27/2014  . Vertigo 09/27/2014  . Vitamin D deficiency 09/27/2014  . Bladder cystocele 04/15/2012  . Asymmetric septal hypertrophy 05/19/2011    Past Surgical History  Procedure Laterality Date  . Bladder repair  2014    tact    Family  History  Problem Relation Age of Onset  . Alzheimer's disease Mother   . Hypertension Mother   . Stroke Father   . Heart disease Father   . Heart disease Brother   . Hypertension Brother   . Diabetes Brother   . Cancer Daughter     breast  . Hypertension Daughter     Social History   Social History  . Marital Status: Married    Spouse Name: N/A  . Number of Children: N/A  . Years of Education: N/A   Occupational History  . Not on file.   Social History Main Topics  . Smoking status: Never Smoker   . Smokeless tobacco: Never Used  . Alcohol Use: No  . Drug Use: No  . Sexual Activity: Not Currently   Other Topics Concern  . Not on file   Social History Narrative     Current outpatient prescriptions:  .  amiodarone (PACERONE) 200 MG tablet, Take 1 tablet by mouth daily., Disp: , Rfl:  .  amLODipine (NORVASC) 10 MG tablet, Take 1 tablet by mouth daily., Disp: , Rfl:  .  atorvastatin (LIPITOR) 80 MG tablet, Take 1 tablet by mouth as needed., Disp: , Rfl:  .  benzonatate (TESSALON) 100 MG capsule, Take 2 capsules (200 mg total) by mouth 3 (three) times daily as needed for cough., Disp: 40 capsule, Rfl: 0 .  carvedilol (COREG) 25 MG tablet, TAKE ONE TABLET BY MOUTH TWICE DAILY, Disp: 180 tablet, Rfl: 1 .  fluticasone (FLONASE) 50 MCG/ACT nasal spray, Place 2 sprays into both nostrils  daily., Disp: 16 g, Rfl: 5 .  folic acid (FOLVITE) 400 MCG tablet, Take 1 tablet by mouth daily., Disp: , Rfl:  .  gabapentin (NEURONTIN) 300 MG capsule, Take 1 capsule by mouth daily., Disp: , Rfl:  .  irbesartan (AVAPRO) 300 MG tablet, TAKE ONE TABLET BY MOUTH EVERY DAY FOR FOR BLOOD PRESSURE, Disp: 90 tablet, Rfl: 1 .  loratadine (CLARITIN) 10 MG tablet, Take 1 tablet by mouth as needed., Disp: , Rfl:  .  PRADAXA 150 MG CAPS capsule, Take 1 tablet by mouth 2 (two) times daily., Disp: , Rfl:  .  predniSONE (DELTASONE) 10 MG tablet, Take 1 tablet (10 mg total) by mouth daily with breakfast.,  Disp: 10 tablet, Rfl: 0 .  ranitidine (ZANTAC) 150 MG tablet, Take 1 tablet by mouth as needed., Disp: , Rfl:  .  Vitamin D, Cholecalciferol, 1000 UNITS TABS, Take 1 tablet by mouth daily., Disp: , Rfl:  .  zolpidem (AMBIEN) 10 MG tablet, TAKE ONE TABLET BY MOUTH NIGHTLY AT BEDTIME AS NEEDED FOR SLEEP, Disp: 90 tablet, Rfl: 0  Allergies  Allergen Reactions  . Nickel Dermatitis and Swelling  . Other Anaphylaxis    Uncoded Allergy. Allergen: SHELLFISH  . Ace Inhibitors Cough     ROS  Constitutional: Negative for fever or weight change.  Respiratory: Positive  for cough negative  shortness of breath.   Cardiovascular: Negative for chest pain or palpitations.  Gastrointestinal: Negative for abdominal pain, no bowel changes.  Musculoskeletal: Negative for gait problem or joint swelling.  Skin: Negative for rash.  Neurological: Negative for dizziness or headache.  No other specific complaints in a complete review of systems (except as listed in HPI above).  Objective  Filed Vitals:   01/03/15 0829  BP: 126/64  Pulse: 69  Temp: 98 F (36.7 C)  TempSrc: Oral  Resp: 16  Height:  (1.651 m)  Weight: 165 lb 1.6 oz (74.889 kg)  SpO2: 98%    Body mass index is 27.47 kg/(m^2).  Physical Exam  Constitutional: Patient appears well-developed and well-nourished. Obese  No distress.  HEENT: head atraumatic, normocephalic, pupils equal and reactive to light, ears TM normal , neck supple, throat within normal limits. Nares bulging and clear rhinorrhea Cardiovascular: Normal rate, regular rhythm and normal heart sounds.  No murmur heard. No BLE edema. Pulmonary/Chest: Effort normal and breath sounds normal. No respiratory distress. Abdominal: Soft.  There is no tenderness. Psychiatric: Patient has a normal mood and affect. behavior is normal. Judgment and thought content normal.   PHQ2/9: Depression screen [facility] 2/9 01/03/2015 09/28/2014  Decreased Interest 0 0  Down, Depressed,  Hopeless 0 0  PHQ - 2 Score 0 0     Fall Risk: Fall Risk  01/03/2015 09/28/2014  Falls in the past year? Yes Yes  Number falls in past yr: 1 1  Injury with Fall? No No     Functional Status Survey: Is the patient deaf or have difficulty hearing?: No Does the patient have difficulty seeing, even when wearing glasses/contacts?: Yes (glasses) Does the patient have difficulty concentrating, remembering, or making decisions?: No Does the patient have difficulty walking or climbing stairs?: No Does the patient have difficulty dressing or bathing?: No Does the patient have difficulty doing errands alone such as visiting a doctor's office or shopping?: No    Assessment & Plan  1. Upper respiratory infection We will try prednisone and Benzonate, fluids and rest , hopefully she can feel great for the grand  event on Saturday - predniSONE (DELTASONE) 10 MG tablet; Take 1 tablet (10 mg total) by mouth daily with breakfast.  Dispense: 10 tablet; Refill: 0 - benzonatate (TESSALON) 100 MG capsule; Take 2 capsules (200 mg total) by mouth 3 (three) times daily as needed for cough.  Dispense: 40 capsule; Refill: 0  2. B12 deficiency  - cyanocobalamin ((VITAMIN B-12)) injection 1,000 mcg; Inject 1 mL (1,000 mcg total) into the muscle once.  3. Needs flu shot  - Flu vaccine HIGH DOSE PF (Fluzone High dose)

## 2015-01-29 ENCOUNTER — Ambulatory Visit: Payer: Medicare Other | Admitting: Family Medicine

## 2015-02-21 ENCOUNTER — Other Ambulatory Visit: Payer: Self-pay | Admitting: Family Medicine

## 2015-02-21 NOTE — Telephone Encounter (Signed)
Patient requesting refill. 

## 2015-02-23 ENCOUNTER — Other Ambulatory Visit: Payer: Self-pay | Admitting: Family Medicine

## 2015-02-23 NOTE — Telephone Encounter (Signed)
Appointment made for 04-12-15

## 2015-02-23 NOTE — Telephone Encounter (Signed)
Patient requesting refill. 

## 2015-04-09 ENCOUNTER — Other Ambulatory Visit: Payer: Self-pay | Admitting: Family Medicine

## 2015-04-10 NOTE — Telephone Encounter (Signed)
Patient requesting refill. 

## 2015-04-12 ENCOUNTER — Ambulatory Visit: Payer: Medicare Other | Admitting: Family Medicine

## 2015-04-19 ENCOUNTER — Encounter: Payer: Self-pay | Admitting: Family Medicine

## 2015-04-19 ENCOUNTER — Ambulatory Visit (INDEPENDENT_AMBULATORY_CARE_PROVIDER_SITE_OTHER): Payer: Medicare Other | Admitting: Family Medicine

## 2015-04-19 ENCOUNTER — Ambulatory Visit
Admission: RE | Admit: 2015-04-19 | Discharge: 2015-04-19 | Disposition: A | Discharge status: Home / Self Care | Payer: Medicare Other | Source: Ambulatory Visit | Attending: Family Medicine | Admitting: Family Medicine

## 2015-04-19 VITALS — BP 148/72 | HR 64 | Temp 97.8°F | Resp 12 | Wt 164.5 lb

## 2015-04-19 DIAGNOSIS — I340 Nonrheumatic mitral (valve) insufficiency: Secondary | ICD-10-CM | POA: Diagnosis not present

## 2015-04-19 DIAGNOSIS — R0989 Other specified symptoms and signs involving the circulatory and respiratory systems: Secondary | ICD-10-CM | POA: Insufficient documentation

## 2015-04-19 DIAGNOSIS — I272 Other secondary pulmonary hypertension: Secondary | ICD-10-CM | POA: Diagnosis not present

## 2015-04-19 DIAGNOSIS — I517 Cardiomegaly: Secondary | ICD-10-CM | POA: Diagnosis not present

## 2015-04-19 DIAGNOSIS — J309 Allergic rhinitis, unspecified: Secondary | ICD-10-CM

## 2015-04-19 DIAGNOSIS — G4700 Insomnia, unspecified: Secondary | ICD-10-CM

## 2015-04-19 DIAGNOSIS — J3089 Other allergic rhinitis: Secondary | ICD-10-CM

## 2015-04-19 DIAGNOSIS — I10 Essential (primary) hypertension: Secondary | ICD-10-CM | POA: Diagnosis not present

## 2015-04-19 DIAGNOSIS — E538 Deficiency of other specified B group vitamins: Secondary | ICD-10-CM

## 2015-04-19 DIAGNOSIS — I480 Paroxysmal atrial fibrillation: Secondary | ICD-10-CM

## 2015-04-19 DIAGNOSIS — I422 Other hypertrophic cardiomyopathy: Secondary | ICD-10-CM | POA: Diagnosis not present

## 2015-04-19 DIAGNOSIS — Z23 Encounter for immunization: Secondary | ICD-10-CM | POA: Diagnosis not present

## 2015-04-19 MED ORDER — ZOLPIDEM TARTRATE 10 MG PO TABS: 10.0000 mg | ORAL_TABLET | Freq: Every evening | ORAL | Status: DC | PRN

## 2015-04-19 MED ORDER — IRBESARTAN 300 MG PO TABS: ORAL_TABLET | ORAL | Status: DC

## 2015-04-19 MED ORDER — CARVEDILOL 25 MG PO TABS: 25.0000 mg | ORAL_TABLET | Freq: Two times a day (BID) | ORAL | Status: DC

## 2015-04-19 MED ORDER — CYANOCOBALAMIN 1000 MCG/ML IJ SOLN: 1000.0000 ug | Freq: Once | INTRAMUSCULAR | Status: DC

## 2015-04-19 NOTE — Progress Notes (Signed)
Name: Sharon Bullock   MRN: [id number]    DOB: October 26, 1933   Date:04/19/2015       Progress Note  Subjective  Chief Complaint  Chief Complaint  Patient presents with  . Medication Refill  . Sinusitis    patient complains of lots of drainage and shortness of breath    HPI  HTN: bp is slightly elevated at this time, she has been taking her medication. She denies side effects of medications  Perennial allergic rhinitis: she has stopped Claritin and Flonase because she states it does not work for her, she has post-nasal drainage, symptoms started 26 years ago when she moved to [facility], but used to be intermittent but now is daily. She states at times she has a hoarseness. No facial pain or headache, no fever.   B12 deficiency with a previous history of pernicious anemia: she is receiving monthly B12 shots and has been doing well  Afib: she sees a cardiologist she has been on Pradaxa, she denies palpitation, no edema or SOB  Hypertrophic cardiomyopathy and hypertensive pulmonary vascular disease: she sees Dr. Regino Schultze and is doing well, no chest pain, no SOB, no paroxysmal nocturnal dyspnea.   Insomnia: she has been compliant with Ambien , denies side effects, on the same dose of years. Able to fall and stay asleep with medication, but it still takes her one hour to fall asleep after she takes the medication. She has been sleeping at least 6 hours per night  Patient Active Problem List   Diagnosis Date Noted  . Pernicious anemia 09/27/2014  . Atrial fibrillation, permanent (HCC) 09/27/2014  . Benign essential HTN 09/27/2014  . Perennial allergic rhinitis 09/27/2014  . Chronic anemia 09/27/2014  . Insomnia, persistent 09/27/2014  . Hypertensive pulmonary vascular disease (HCC) 09/27/2014  . B12 deficiency 09/27/2014  . Diverticulosis of colon 09/27/2014  . Dyslipidemia 09/27/2014  . Edema extremities 09/27/2014  . Gastric reflux 09/27/2014  . Bilateral hearing loss 09/27/2014  . Polypharmacy  09/27/2014  . Pelvic pain in female 09/27/2014  . MI (mitral incompetence) 09/27/2014  . Internal hemorrhoids 09/27/2014  . Osteopenia 09/27/2014  . Arthralgia of shoulder 09/27/2014  . Finger tendinitis 09/27/2014  . Urge incontinence 09/27/2014  . Cervical prolapse 09/27/2014  . Vertigo 09/27/2014  . Vitamin D deficiency 09/27/2014  . Bladder cystocele 04/15/2012  . Asymmetric septal hypertrophy (HCC) 05/19/2011  . Hypertrophic cardiomyopathy (HCC) 05/19/2011    Past Surgical History  Procedure Laterality Date  . Bladder repair  2014    tact    Family History  Problem Relation Age of Onset  . Alzheimer's disease Mother   . Hypertension Mother   . Stroke Father   . Heart disease Father   . Heart disease Brother   . Hypertension Brother   . Diabetes Brother   . Cancer Daughter     breast  . Hypertension Daughter     Social History   Social History  . Marital Status: Married    Spouse Name: N/A  . Number of Children: N/A  . Years of Education: N/A   Occupational History  . Not on file.   Social History Main Topics  . Smoking status: Never Smoker   . Smokeless tobacco: Never Used  . Alcohol Use: No  . Drug Use: No  . Sexual Activity: Not Currently   Other Topics Concern  . Not on file   Social History Narrative     Current outpatient prescriptions:  .  amiodarone (PACERONE) 200  MG tablet, Take 1 tablet by mouth daily., Disp: , Rfl:  .  amLODipine (NORVASC) 10 MG tablet, Take 1 tablet by mouth daily., Disp: , Rfl:  .  atorvastatin (LIPITOR) 40 MG tablet, , Disp: , Rfl:  .  carvedilol (COREG) 25 MG tablet, TAKE ONE TABLET BY MOUTH TWICE DAILY, Disp: 180 tablet, Rfl: 1 .  irbesartan (AVAPRO) 300 MG tablet, TAKE ONE TABLET BY MOUTH EVERY DAY FOR FOR BLOOD PRESSURE, Disp: 90 tablet, Rfl: 1 .  PRADAXA 150 MG CAPS capsule, Take 1 tablet by mouth 2 (two) times daily., Disp: , Rfl:  .  Vitamin D, Cholecalciferol, 1000 UNITS TABS, Take 1 tablet by mouth daily.,  Disp: , Rfl:  .  zolpidem (AMBIEN) 10 MG tablet, TAKE ONE TABLET BY MOUTH AT BEDTIME AS NEEDED FOR SLEEP, Disp: 90 tablet, Rfl: 0 .  diclofenac sodium (VOLTAREN) 1 % GEL, APPLY TO RIGHT HAND AND FINGER TWICE DAILY (Patient not taking: Reported on 04/19/2015), Disp: 100 g, Rfl: 0 .  fluticasone (FLONASE) 50 MCG/ACT nasal spray, Place 2 sprays into both nostrils daily. (Patient not taking: Reported on 04/19/2015), Disp: 16 g, Rfl: 5 .  folic acid (FOLVITE) 400 MCG tablet, Take 1 tablet by mouth daily. Reported on 04/19/2015, Disp: , Rfl:  .  loratadine (CLARITIN) 10 MG tablet, Take 1 tablet by mouth as needed. Reported on 04/19/2015, Disp: , Rfl:   Current facility-administered medications:  .  cyanocobalamin ((VITAMIN B-12)) injection 1,000 mcg, 1,000 mcg, Intramuscular, Once, Alba Cory, MD  Allergies  Allergen Reactions  . Nickel Dermatitis and Swelling  . Other Anaphylaxis    Uncoded Allergy. Allergen: SHELLFISH  . Ace Inhibitors Cough     ROS  Constitutional: Negative for fever or weight change.  Respiratory: Negative for cough and shortness of breath.   Cardiovascular: Negative for chest pain or palpitations.  Gastrointestinal: Negative for abdominal pain, no bowel changes.  Musculoskeletal: Negative for gait problem or joint swelling.  Skin: Negative for rash.  Neurological: Negative for dizziness or headache.  No other specific complaints in a complete review of systems (except as listed in HPI above). Objective  Filed Vitals:   04/19/15 1007  BP: 148/72  Pulse: 64  Temp: 97.8 F (36.6 C)  TempSrc: Oral  Resp: 12  Weight: 164 lb 8 oz (74.617 kg)  SpO2: 96%    Body mass index is 27.37 kg/(m^2).  Physical Exam  Constitutional: Patient appears well-developed and well-nourished.  No distress.  HEENT: head atraumatic, normocephalic, pupils equal and reactive to light, ears TM normal, boggy pale turbinate, no drainage seen today neck supple, throat within normal  limits Cardiovascular:  Normal rate and rhythm,2/6 Ejection systolic murmur . No BLE edema. Pulmonary/Chest: Effort normal, mild coarse crackles on both bases. No respiratory distress. Abdominal: Soft.  There is no tenderness. Psychiatric: Patient has a normal mood and affect. behavior is normal. Judgment and thought content normal.   PHQ2/9: Depression screen [facility] 2/9 04/19/2015 01/03/2015 09/28/2014  Decreased Interest 0 0 0  Down, Depressed, Hopeless 0 0 0  PHQ - 2 Score 0 0 0     Fall Risk: Fall Risk  04/19/2015 01/03/2015 09/28/2014  Falls in the past year? No Yes Yes  Number falls in past yr: - 1 1  Injury with Fall? - No No     Functional Status Survey: Is the patient deaf or have difficulty hearing?: Yes Does the patient have difficulty seeing, even when wearing glasses/contacts?: Yes Does the patient have difficulty  concentrating, remembering, or making decisions?: No Does the patient have difficulty walking or climbing stairs?: No Does the patient have difficulty dressing or bathing?: No Does the patient have difficulty doing errands alone such as visiting a doctor's office or shopping?: No    Assessment & Plan  1. Benign essential HTN  - irbesartan (AVAPRO) 300 MG tablet; TAKE ONE TABLET BY MOUTH EVERY DAY FOR FOR BLOOD PRESSURE  Dispense: 90 tablet; Refill: 1 - carvedilol (COREG) 25 MG tablet; Take 1 tablet (25 mg total) by mouth 2 (two) times daily.  Dispense: 180 tablet; Refill: 1  2. Need for vaccination for Strep pneumoniae  - Pneumococcal conjugate vaccine 13-valent IM  3. Atrial fibrillation, paroxymal (HCC)  Stable, continue follow up with cardiologist  4. Perennial allergic rhinitis  - Ambulatory referral to ENT - failed Claritin and Flonase, year round symtpoms  5. B12 deficiency  - cyanocobalamin ((VITAMIN B-12)) injection 1,000 mcg; Inject 1 mL (1,000 mcg total) into the muscle once.  6. Insomnia, persistent  - zolpidem (AMBIEN) 10 MG tablet;  Take 1 tablet (10 mg total) by mouth at bedtime as needed. for sleep  Dispense: 90 tablet; Refill: 1  7. Hypertrophic cardiomyopathy (HCC)  Continue follow up with cardiologist  8. Hypertensive pulmonary vascular disease (HCC)  Continue follow up with cardiologist   10. Abnormal lung sounds  - DG Chest 2 View; Future

## 2015-05-03 DIAGNOSIS — H9319 Tinnitus, unspecified ear: Secondary | ICD-10-CM | POA: Diagnosis not present

## 2015-05-03 DIAGNOSIS — R490 Dysphonia: Secondary | ICD-10-CM | POA: Diagnosis not present

## 2015-05-03 DIAGNOSIS — J310 Chronic rhinitis: Secondary | ICD-10-CM | POA: Diagnosis not present

## 2015-05-03 DIAGNOSIS — H6123 Impacted cerumen, bilateral: Secondary | ICD-10-CM | POA: Diagnosis not present

## 2015-05-03 DIAGNOSIS — H93293 Other abnormal auditory perceptions, bilateral: Secondary | ICD-10-CM | POA: Diagnosis not present

## 2015-06-08 DIAGNOSIS — I4891 Unspecified atrial fibrillation: Secondary | ICD-10-CM | POA: Diagnosis not present

## 2015-06-08 DIAGNOSIS — M545 Low back pain: Secondary | ICD-10-CM | POA: Diagnosis not present

## 2015-06-08 DIAGNOSIS — M549 Dorsalgia, unspecified: Secondary | ICD-10-CM | POA: Diagnosis not present

## 2015-07-10 ENCOUNTER — Other Ambulatory Visit: Payer: Self-pay | Admitting: Family Medicine

## 2015-07-25 DIAGNOSIS — H2513 Age-related nuclear cataract, bilateral: Secondary | ICD-10-CM | POA: Diagnosis not present

## 2015-08-02 ENCOUNTER — Other Ambulatory Visit: Payer: Self-pay

## 2015-08-02 ENCOUNTER — Ambulatory Visit (INDEPENDENT_AMBULATORY_CARE_PROVIDER_SITE_OTHER): Payer: Medicare Other

## 2015-08-02 DIAGNOSIS — E538 Deficiency of other specified B group vitamins: Secondary | ICD-10-CM | POA: Diagnosis not present

## 2015-08-02 MED ORDER — CYANOCOBALAMIN 1000 MCG/ML IJ SOLN
1000.0000 ug | Freq: Once | INTRAMUSCULAR | Status: AC
  Administered 2015-08-02: 1000 ug via INTRAMUSCULAR

## 2015-08-02 MED ORDER — SOLIFENACIN SUCCINATE 5 MG PO TABS: 5.0000 mg | ORAL_TABLET | Freq: Every day | ORAL | Status: DC

## 2015-08-02 NOTE — Telephone Encounter (Signed)
Patient requesting refill. 

## 2015-08-10 ENCOUNTER — Telehealth: Payer: Self-pay | Admitting: Family Medicine

## 2015-08-10 NOTE — Telephone Encounter (Signed)
Requesting authorization on Zolpidem.

## 2015-08-13 NOTE — Telephone Encounter (Signed)
Filled out PA on [email or web address] on 08/13/2015

## 2015-08-17 ENCOUNTER — Ambulatory Visit (INDEPENDENT_AMBULATORY_CARE_PROVIDER_SITE_OTHER): Payer: Medicare Other | Admitting: Family Medicine

## 2015-08-17 ENCOUNTER — Encounter: Payer: Self-pay | Admitting: Family Medicine

## 2015-08-17 VITALS — BP 112/52 | HR 65 | Temp 98.1°F | Resp 16 | Ht 65.0 in | Wt 165.8 lb

## 2015-08-17 DIAGNOSIS — E785 Hyperlipidemia, unspecified: Secondary | ICD-10-CM

## 2015-08-17 DIAGNOSIS — H9190 Unspecified hearing loss, unspecified ear: Secondary | ICD-10-CM | POA: Diagnosis not present

## 2015-08-17 DIAGNOSIS — R350 Frequency of micturition: Secondary | ICD-10-CM | POA: Diagnosis not present

## 2015-08-17 DIAGNOSIS — E538 Deficiency of other specified B group vitamins: Secondary | ICD-10-CM | POA: Diagnosis not present

## 2015-08-17 DIAGNOSIS — R42 Dizziness and giddiness: Secondary | ICD-10-CM | POA: Diagnosis not present

## 2015-08-17 DIAGNOSIS — G4700 Insomnia, unspecified: Secondary | ICD-10-CM | POA: Diagnosis not present

## 2015-08-17 DIAGNOSIS — I422 Other hypertrophic cardiomyopathy: Secondary | ICD-10-CM | POA: Diagnosis not present

## 2015-08-17 DIAGNOSIS — I10 Essential (primary) hypertension: Secondary | ICD-10-CM

## 2015-08-17 DIAGNOSIS — I480 Paroxysmal atrial fibrillation: Secondary | ICD-10-CM

## 2015-08-17 MED ORDER — CARVEDILOL 25 MG PO TABS: 25.0000 mg | ORAL_TABLET | Freq: Two times a day (BID) | ORAL | Status: DC

## 2015-08-17 MED ORDER — ZOLPIDEM TARTRATE 10 MG PO TABS: 10.0000 mg | ORAL_TABLET | Freq: Every evening | ORAL | Status: DC | PRN

## 2015-08-17 MED ORDER — IRBESARTAN 300 MG PO TABS: ORAL_TABLET | ORAL | Status: DC

## 2015-08-17 NOTE — Patient Instructions (Signed)
Take half of Norvac ( Amlodipine ) from 10 mg to 5 mg and monitor BP at home and return for follow for bp check when you return for B12 injection.

## 2015-08-17 NOTE — Progress Notes (Signed)
Name: Sharon Bullock   MRN: [id number]    DOB: February 07, 1934   Date:08/17/2015       Progress Note  Subjective  Chief Complaint  Chief Complaint  Patient presents with  . Medication Refill    3 month F/U  . Hypertension    Patient states she is dizzy and edema in bilateral ankles.   . Insomnia    Patient states she does not sleep well, she only sleeps 4-6 hours nighly with trouble falling asleep. Insurance will not cover Ambien due to patient needing to try other alternatives first due to being over 82 years of age.  . Hyperlipidemia    Muscle cramps  . Allergic Rhinitis     Worst with the seasonal changes  . Urinary Incontinence    Vesicare cost patient $78 a month, would like to see if there is cheaper options. Uses medications as needed    HPI  HTN: bp is low , she has been taking her medication. Feeling light headed over the past couple of months. Worse when she first stands up, no chest pain or SOB. BP at home 140's/60's this week .  Perennial allergic rhinitis: she has stopped Claritin and Flonase because she states it does not work for her - and flonase makes her dizzy,  she has post-nasal drainage, symptoms started 26 years ago when she moved to [place], but used to be intermittent but now is daily. She states at times she has a hoarseness. No facial pain or headache, no fever.   B12 deficiency with a previous history of pernicious anemia: she is receiving monthly B12 shots and has been doing well  Afib: she sees a cardiologist - Dr. Regino Schultze at [facility]. She has been on Pradaxa, she denies palpitation, no edema or SOB  Hypertrophic cardiomyopathy and hypertensive pulmonary vascular disease: she sees Dr. Regino Schultze and is doing well, no chest pain, no SOB, no paroxysmal nocturnal dyspnea.   Insomnia: she has been compliant with Ambien , denies side effects, on the same dose of years. Able to fall and stay asleep with medication, but it still takes her one hour to fall asleep after she takes the  medication. She has been sleeping at least 6 hours per night, but sleep has been interrupted by nocturia.   Nocturia: she states she has noticed urinary frequency, worse at night for months, she could not afford Vesicare, no dysuria or hematuria.    Patient Active Problem List   Diagnosis Date Noted  . Hearing loss 08/17/2015  . Mitral regurgitation 04/19/2015  . Pernicious anemia 09/27/2014  . Paroxysmal a-fib (HCC) 09/27/2014  . Benign essential HTN 09/27/2014  . Perennial allergic rhinitis 09/27/2014  . Chronic anemia 09/27/2014  . Insomnia, persistent 09/27/2014  . Hypertensive pulmonary vascular disease (HCC) 09/27/2014  . B12 deficiency 09/27/2014  . Diverticulosis of colon 09/27/2014  . Dyslipidemia 09/27/2014  . Edema extremities 09/27/2014  . Gastric reflux 09/27/2014  . Bilateral hearing loss 09/27/2014  . Polypharmacy 09/27/2014  . Pelvic pain in female 09/27/2014  . MI (mitral incompetence) 09/27/2014  . Internal hemorrhoids 09/27/2014  . Osteopenia 09/27/2014  . Arthralgia of shoulder 09/27/2014  . Finger tendinitis 09/27/2014  . Urge incontinence 09/27/2014  . Cervical prolapse 09/27/2014  . Vertigo 09/27/2014  . Vitamin D deficiency 09/27/2014  . Bladder cystocele 04/15/2012  . Asymmetric septal hypertrophy (HCC) 05/19/2011  . Hypertrophic cardiomyopathy (HCC) 05/19/2011    Past Surgical History  Procedure Laterality Date  . Bladder repair  2014  tact    Family History  Problem Relation Age of Onset  . Alzheimer's disease Mother   . Hypertension Mother   . Stroke Father   . Heart disease Father   . Heart disease Brother   . Hypertension Brother   . Diabetes Brother   . Cancer Daughter     breast  . Hypertension Daughter     Social History   Social History  . Marital Status: Married    Spouse Name: N/A  . Number of Children: N/A  . Years of Education: N/A   Occupational History  . Not on file.   Social History Main Topics  .  Smoking status: Never Smoker   . Smokeless tobacco: Never Used  . Alcohol Use: No  . Drug Use: No  . Sexual Activity: Not Currently   Other Topics Concern  . Not on file   Social History Narrative     Current outpatient prescriptions:  .  amiodarone (PACERONE) 200 MG tablet, Take 1 tablet by mouth daily., Disp: , Rfl:  .  amLODipine (NORVASC) 10 MG tablet, Take 0.5 tablets by mouth daily., Disp: , Rfl:  .  atorvastatin (LIPITOR) 40 MG tablet, , Disp: , Rfl:  .  carvedilol (COREG) 25 MG tablet, Take 1 tablet (25 mg total) by mouth 2 (two) times daily., Disp: 180 tablet, Rfl: 1 .  diclofenac sodium (VOLTAREN) 1 % GEL, APPLY TO RIGHT HAND AND FINGER TWICE DAILY, Disp: 100 g, Rfl: 0 .  fluticasone (FLONASE) 50 MCG/ACT nasal spray, Place 2 sprays into both nostrils daily., Disp: 16 g, Rfl: 5 .  folic acid (FOLVITE) 400 MCG tablet, Take 1 tablet by mouth daily. Reported on 04/19/2015, Disp: , Rfl:  .  gabapentin (NEURONTIN) 300 MG capsule, , Disp: , Rfl:  .  irbesartan (AVAPRO) 300 MG tablet, TAKE ONE TABLET BY MOUTH EVERY DAY FOR FOR BLOOD PRESSURE, Disp: 90 tablet, Rfl: 1 .  loratadine (CLARITIN) 10 MG tablet, Take 1 tablet by mouth as needed. Reported on 04/19/2015, Disp: , Rfl:  .  PRADAXA 150 MG CAPS capsule, Take 1 tablet by mouth 2 (two) times daily., Disp: , Rfl:  .  Vitamin D, Cholecalciferol, 1000 UNITS TABS, Take 1 tablet by mouth daily., Disp: , Rfl:  .  zolpidem (AMBIEN) 10 MG tablet, Take 1 tablet (10 mg total) by mouth at bedtime as needed. for sleep, Disp: 90 tablet, Rfl: 1  Current facility-administered medications:  .  cyanocobalamin ((VITAMIN B-12)) injection 1,000 mcg, 1,000 mcg, Intramuscular, Once, Alba Cory, MD  Allergies  Allergen Reactions  . Nickel Dermatitis and Swelling  . Other Anaphylaxis    Uncoded Allergy. Allergen: SHELLFISH  . Ace Inhibitors Cough     ROS  Constitutional: Negative for fever or weight change.  Respiratory: Negative for cough and  shortness of breath.   Cardiovascular: Negative for chest pain or palpitations.  Gastrointestinal: Negative for abdominal pain, no bowel changes.  Musculoskeletal: Negative for gait problem or joint swelling.  Skin: Negative for rash.  Neurological: Positive for dizziness, she has intermittent  headache.  No other specific complaints in a complete review of systems (except as listed in HPI above).  Objective  Filed Vitals:   08/17/15 1023  BP: 112/52  Pulse: 65  Temp: 98.1 F (36.7 C)  TempSrc: Oral  Resp: 16  Height: 5\' 5"  (1.651 m)  Weight: 165 lb 12.8 oz (75.206 kg)  SpO2: 96%    Body mass index is 27.59 kg/(m^2).  Physical  Exam  Constitutional: Patient appears well-developed and well-nourished.No distress.  HEENT: head atraumatic, normocephalic, pupils equal and reactive to light, neck supple, throat within normal limits Cardiovascular: Normal rate, and rhythm.  Heart murmur heard 3/6 . No BLE edema. Pulmonary/Chest: Effort normal and breath sounds normal. No respiratory distress. Abdominal: Soft.  There is no tenderness. Psychiatric: Patient has a normal mood and affect. behavior is normal. Judgment and thought content normal. Neurological: Romberg negative, normal grip  PHQ2/9: Depression screen [facility] 2/9 08/17/2015 04/19/2015 01/03/2015 09/28/2014  Decreased Interest 0 0 0 0  Down, Depressed, Hopeless 0 0 0 0  PHQ - 2 Score 0 0 0 0    Fall Risk: Fall Risk  08/17/2015 04/19/2015 01/03/2015 09/28/2014  Falls in the past year? No No Yes Yes  Number falls in past yr: - - 1 1  Injury with Fall? - - No No    Functional Status Survey: Is the patient deaf or have difficulty hearing?: No Does the patient have difficulty seeing, even when wearing glasses/contacts?: No Does the patient have difficulty concentrating, remembering, or making decisions?: No Does the patient have difficulty walking or climbing stairs?: No Does the patient have difficulty dressing or bathing?: No Does  the patient have difficulty doing errands alone such as visiting a doctor's office or shopping?: No   Assessment & Plan  1. Urinary frequency  She can't afford Vesicare - Urine culture - POCT Urinalysis Dipstick History of colporrhaphy for cystocele repair back in 2014 by Dr. Marguerita Beards at [facility] - department of Urogynedology   2. Dizziness  - CBC with Differential/Platelet - Comprehensive metabolic panel  3. Benign essential HTN  - Comprehensive metabolic panel - irbesartan (AVAPRO) 300 MG tablet; TAKE ONE TABLET BY MOUTH EVERY DAY FOR FOR BLOOD PRESSURE  Dispense: 90 tablet; Refill: 1 - carvedilol (COREG) 25 MG tablet; Take 1 tablet (25 mg total) by mouth 2 (two) times daily.  Dispense: 180 tablet; Refill: 1  4. Hypertrophic cardiomyopathy (HCC)  stable  5. Paroxysmal a-fib (HCC)  stable  6. Insomnia, persistent  - zolpidem (AMBIEN) 10 MG tablet; Take 1 tablet (10 mg total) by mouth at bedtime as needed. for sleep  Dispense: 90 tablet; Refill: 1  7. Dyslipidemia  - Lipid panel  8. B12 deficiency  Getting monthly B12

## 2015-08-18 LAB — CBC WITH DIFFERENTIAL/PLATELET
BASOS ABS: 0 10*3/uL (ref 0.0–0.2)
Basos: 0 %
EOS (ABSOLUTE): 0.1 10*3/uL (ref 0.0–0.4)
Eos: 2 %
HEMOGLOBIN: 11.4 g/dL (ref 11.1–15.9)
Hematocrit: 34.9 % (ref 34.0–46.6)
Immature Grans (Abs): 0 10*3/uL (ref 0.0–0.1)
Immature Granulocytes: 0 %
LYMPHS ABS: 1.4 10*3/uL (ref 0.7–3.1)
Lymphs: 29 %
MCH: 29.4 pg (ref 26.6–33.0)
MCHC: 32.7 g/dL (ref 31.5–35.7)
MCV: 90 fL (ref 79–97)
MONOS ABS: 0.4 10*3/uL (ref 0.1–0.9)
Monocytes: 7 %
NEUTROS ABS: 3.1 10*3/uL (ref 1.4–7.0)
Neutrophils: 62 %
Platelets: 177 10*3/uL (ref 150–379)
RBC: 3.88 x10E6/uL (ref 3.77–5.28)
RDW: 14.1 % (ref 12.3–15.4)
WBC: 5 10*3/uL (ref 3.4–10.8)

## 2015-08-18 LAB — COMPREHENSIVE METABOLIC PANEL
ALK PHOS: 69 IU/L (ref 39–117)
ALT: 13 IU/L (ref 0–32)
AST: 26 IU/L (ref 0–40)
Albumin/Globulin Ratio: 1.4 (ref 1.2–2.2)
Albumin: 4.2 g/dL (ref 3.5–4.7)
BUN/Creatinine Ratio: 17 (ref 12–28)
BUN: 18 mg/dL (ref 8–27)
Bilirubin Total: 0.4 mg/dL (ref 0.0–1.2)
CO2: 22 mmol/L (ref 18–29)
CREATININE: 1.09 mg/dL — AB (ref 0.57–1.00)
Calcium: 9.5 mg/dL (ref 8.7–10.3)
Chloride: 104 mmol/L (ref 96–106)
GFR calc Af Amer: 55 mL/min/{1.73_m2} — ABNORMAL LOW (ref >59)
GFR calc non Af Amer: 48 mL/min/{1.73_m2} — ABNORMAL LOW (ref >59)
GLUCOSE: 92 mg/dL (ref 65–99)
Globulin, Total: 3.1 g/dL (ref 1.5–4.5)
Potassium: 4.2 mmol/L (ref 3.5–5.2)
Sodium: 142 mmol/L (ref 134–144)
Total Protein: 7.3 g/dL (ref 6.0–8.5)

## 2015-08-18 LAB — LIPID PANEL
CHOLESTEROL TOTAL: 192 mg/dL (ref 100–199)
Chol/HDL Ratio: 2 ratio units (ref 0.0–4.4)
HDL: 96 mg/dL (ref >39)
LDL CALC: 85 mg/dL (ref 0–99)
TRIGLYCERIDES: 55 mg/dL (ref 0–149)
VLDL CHOLESTEROL CAL: 11 mg/dL (ref 5–40)

## 2015-08-19 ENCOUNTER — Other Ambulatory Visit: Payer: Self-pay | Admitting: Family Medicine

## 2015-08-19 DIAGNOSIS — N390 Urinary tract infection, site not specified: Secondary | ICD-10-CM

## 2015-08-19 LAB — URINE CULTURE

## 2015-08-19 MED ORDER — AMOXICILLIN 500 MG PO TABS: 500.0000 mg | ORAL_TABLET | Freq: Two times a day (BID) | ORAL | Status: DC

## 2015-09-03 ENCOUNTER — Ambulatory Visit (INDEPENDENT_AMBULATORY_CARE_PROVIDER_SITE_OTHER): Payer: Medicare Other | Admitting: Family Medicine

## 2015-09-03 VITALS — BP 132/70

## 2015-09-03 DIAGNOSIS — D519 Vitamin B12 deficiency anemia, unspecified: Secondary | ICD-10-CM

## 2015-09-03 DIAGNOSIS — E538 Deficiency of other specified B group vitamins: Secondary | ICD-10-CM | POA: Diagnosis not present

## 2015-09-03 MED ORDER — CYANOCOBALAMIN 1000 MCG/ML IJ SOLN
1000.0000 ug | Freq: Once | INTRAMUSCULAR | Status: AC
  Administered 2015-09-03: 1000 ug via INTRAMUSCULAR

## 2015-09-24 ENCOUNTER — Other Ambulatory Visit: Payer: Self-pay | Admitting: Family Medicine

## 2015-11-02 ENCOUNTER — Ambulatory Visit: Payer: Medicare Other

## 2015-11-09 ENCOUNTER — Ambulatory Visit (INDEPENDENT_AMBULATORY_CARE_PROVIDER_SITE_OTHER): Payer: Medicare Other

## 2015-11-09 DIAGNOSIS — D519 Vitamin B12 deficiency anemia, unspecified: Secondary | ICD-10-CM | POA: Diagnosis not present

## 2015-11-09 MED ORDER — CYANOCOBALAMIN 1000 MCG/ML IJ SOLN
1000.0000 ug | Freq: Once | INTRAMUSCULAR | Status: AC
  Administered 2015-11-09: 1000 ug via INTRAMUSCULAR

## 2015-12-21 ENCOUNTER — Other Ambulatory Visit: Payer: Self-pay | Admitting: Family Medicine

## 2015-12-21 NOTE — Telephone Encounter (Signed)
Patient requesting refill of Amlodipine be sent to [facility].

## 2015-12-24 ENCOUNTER — Other Ambulatory Visit: Payer: Self-pay | Admitting: Family Medicine

## 2015-12-24 NOTE — Telephone Encounter (Signed)
Patient informed and appointment made for 01/23/16

## 2016-01-10 DIAGNOSIS — I313 Pericardial effusion (noninflammatory): Secondary | ICD-10-CM | POA: Diagnosis not present

## 2016-01-10 DIAGNOSIS — R109 Unspecified abdominal pain: Secondary | ICD-10-CM | POA: Diagnosis not present

## 2016-01-10 DIAGNOSIS — I6789 Other cerebrovascular disease: Secondary | ICD-10-CM | POA: Diagnosis not present

## 2016-01-10 DIAGNOSIS — I10 Essential (primary) hypertension: Secondary | ICD-10-CM | POA: Diagnosis not present

## 2016-01-10 DIAGNOSIS — Z8249 Family history of ischemic heart disease and other diseases of the circulatory system: Secondary | ICD-10-CM | POA: Diagnosis not present

## 2016-01-10 DIAGNOSIS — H8110 Benign paroxysmal vertigo, unspecified ear: Secondary | ICD-10-CM | POA: Diagnosis not present

## 2016-01-10 DIAGNOSIS — G930 Cerebral cysts: Secondary | ICD-10-CM | POA: Diagnosis not present

## 2016-01-10 DIAGNOSIS — E7800 Pure hypercholesterolemia, unspecified: Secondary | ICD-10-CM | POA: Diagnosis not present

## 2016-01-10 DIAGNOSIS — R001 Bradycardia, unspecified: Secondary | ICD-10-CM | POA: Diagnosis not present

## 2016-01-10 DIAGNOSIS — I371 Nonrheumatic pulmonary valve insufficiency: Secondary | ICD-10-CM | POA: Diagnosis not present

## 2016-01-10 DIAGNOSIS — I679 Cerebrovascular disease, unspecified: Secondary | ICD-10-CM | POA: Diagnosis not present

## 2016-01-10 DIAGNOSIS — I6521 Occlusion and stenosis of right carotid artery: Secondary | ICD-10-CM | POA: Diagnosis not present

## 2016-01-10 DIAGNOSIS — I6523 Occlusion and stenosis of bilateral carotid arteries: Secondary | ICD-10-CM | POA: Diagnosis not present

## 2016-01-10 DIAGNOSIS — R110 Nausea: Secondary | ICD-10-CM | POA: Diagnosis not present

## 2016-01-10 DIAGNOSIS — I4891 Unspecified atrial fibrillation: Secondary | ICD-10-CM | POA: Diagnosis not present

## 2016-01-10 DIAGNOSIS — E785 Hyperlipidemia, unspecified: Secondary | ICD-10-CM | POA: Diagnosis not present

## 2016-01-10 DIAGNOSIS — Z79899 Other long term (current) drug therapy: Secondary | ICD-10-CM | POA: Diagnosis not present

## 2016-01-10 DIAGNOSIS — J329 Chronic sinusitis, unspecified: Secondary | ICD-10-CM | POA: Diagnosis not present

## 2016-01-10 DIAGNOSIS — R42 Dizziness and giddiness: Secondary | ICD-10-CM | POA: Diagnosis not present

## 2016-01-10 DIAGNOSIS — Z7901 Long term (current) use of anticoagulants: Secondary | ICD-10-CM | POA: Diagnosis not present

## 2016-01-23 ENCOUNTER — Encounter: Payer: Self-pay | Admitting: Family Medicine

## 2016-01-23 ENCOUNTER — Ambulatory Visit (INDEPENDENT_AMBULATORY_CARE_PROVIDER_SITE_OTHER): Payer: Medicare Other | Admitting: Family Medicine

## 2016-01-23 VITALS — BP 128/64 | HR 60 | Temp 97.9°F | Resp 16 | Ht 65.0 in | Wt 166.4 lb

## 2016-01-23 DIAGNOSIS — I422 Other hypertrophic cardiomyopathy: Secondary | ICD-10-CM | POA: Diagnosis not present

## 2016-01-23 DIAGNOSIS — R42 Dizziness and giddiness: Secondary | ICD-10-CM | POA: Diagnosis not present

## 2016-01-23 DIAGNOSIS — G4700 Insomnia, unspecified: Secondary | ICD-10-CM | POA: Diagnosis not present

## 2016-01-23 DIAGNOSIS — I480 Paroxysmal atrial fibrillation: Secondary | ICD-10-CM

## 2016-01-23 DIAGNOSIS — Z23 Encounter for immunization: Secondary | ICD-10-CM | POA: Diagnosis not present

## 2016-01-23 DIAGNOSIS — I6523 Occlusion and stenosis of bilateral carotid arteries: Secondary | ICD-10-CM

## 2016-01-23 DIAGNOSIS — I10 Essential (primary) hypertension: Secondary | ICD-10-CM

## 2016-01-23 DIAGNOSIS — E538 Deficiency of other specified B group vitamins: Secondary | ICD-10-CM | POA: Diagnosis not present

## 2016-01-23 DIAGNOSIS — E785 Hyperlipidemia, unspecified: Secondary | ICD-10-CM

## 2016-01-23 MED ORDER — CARVEDILOL 25 MG PO TABS: 25.0000 mg | ORAL_TABLET | Freq: Two times a day (BID) | ORAL | 1 refills | Status: DC

## 2016-01-23 MED ORDER — IRBESARTAN 300 MG PO TABS: ORAL_TABLET | ORAL | 1 refills | Status: DC

## 2016-01-23 MED ORDER — CYANOCOBALAMIN 1000 MCG/ML IJ SOLN
1000.0000 ug | Freq: Once | INTRAMUSCULAR | Status: AC
  Administered 2016-01-23: 1000 ug via INTRAMUSCULAR

## 2016-01-23 MED ORDER — ATORVASTATIN CALCIUM 40 MG PO TABS: 40.0000 mg | ORAL_TABLET | Freq: Every evening | ORAL | 1 refills | Status: DC

## 2016-01-23 MED ORDER — AMLODIPINE BESYLATE 10 MG PO TABS: 10.0000 mg | ORAL_TABLET | Freq: Every day | ORAL | 1 refills | Status: DC

## 2016-01-23 MED ORDER — TRAZODONE HCL 50 MG PO TABS: 25.0000 mg | ORAL_TABLET | Freq: Every evening | ORAL | 0 refills | Status: DC | PRN

## 2016-01-23 NOTE — Progress Notes (Signed)
Name: Sharon Bullock   MRN: [id number]    DOB: Sep 06, 1933   Date:01/23/2016       Progress Note  Subjective  Chief Complaint  Chief Complaint  Patient presents with  . Hypertension  . Anemia    b12 deficiency  . Flu Vaccine  . Dizziness    HPI  HTN: bp is at goal today, no orthostatic changes, chest pain or palpitation.   Carotid atherosclerosis: found during MRI head done at [facility] at [facility] for evaluation of vertigo in 12/2015. She has a follow up with Dr. Regino Schultze. On medical management at this time  Vertigo: went to [facility] for spinning sensation, worse in mornings when getting out of bed or when reaching above her head, also with rotation of her head while in bed. She was referred to PT at [facility] but would like to go to local PT . She does not want to see ENT at this time, she states she responded to PT in the past  Perennial allergic rhinitis:  she has post-nasal drainage, symptoms started 26 years ago when she moved to [facility], but used to be intermittent but now is daily. She states at times she has a hoarseness. No facial pain or headache, no fever. She is back on otc Flonase but not back on Claritin   B12 deficiency with a previous history of pernicious anemia: she is receiving monthly B12 shots and has been doing well  Afib: she sees a cardiologist - Dr. Regino Schultze at [facility]. She has been on Pradaxa, she denies palpitation, no edema or SOB  Hypertrophic cardiomyopathy and hypertensive pulmonary vascular disease: she sees Dr. Regino Schultze and is doing well, no chest pain, no SOB,  no paroxysmal nocturnal dyspnea, she has bilateral ankle edema.    Insomnia: she has been compliant with Ambien , denies side effects, on the same dose of years. Able to fall and stay asleep with medication, but it still takes her one hour to fall asleep after she takes the medication. She has been sleeping at least 6 hours per night, but sleep has been interrupted by nocturia. Discussed risk of high dose Ambien for her gender and  age, and we will try changing to Trazodone, advised to wean self off Ambien to 5 mg prior to switch    Patient Active Problem List   Diagnosis Date Noted  . Carotid atherosclerosis, bilateral 01/23/2016  . Hearing loss 08/17/2015  . Mitral regurgitation 04/19/2015  . Pernicious anemia 09/27/2014  . Paroxysmal a-fib (HCC) 09/27/2014  . Benign essential HTN 09/27/2014  . Perennial allergic rhinitis 09/27/2014  . Chronic anemia 09/27/2014  . Insomnia, persistent 09/27/2014  . Hypertensive pulmonary vascular disease 09/27/2014  . B12 deficiency 09/27/2014  . Diverticulosis of colon 09/27/2014  . Dyslipidemia 09/27/2014  . Edema extremities 09/27/2014  . Gastric reflux 09/27/2014  . Bilateral hearing loss 09/27/2014  . Polypharmacy 09/27/2014  . Pelvic pain in female 09/27/2014  . MI (mitral incompetence) 09/27/2014  . Internal hemorrhoids 09/27/2014  . Osteopenia 09/27/2014  . Arthralgia of shoulder 09/27/2014  . Finger tendinitis 09/27/2014  . Urge incontinence 09/27/2014  . Cervical prolapse 09/27/2014  . Vertigo 09/27/2014  . Vitamin D deficiency 09/27/2014  . Bladder cystocele 04/15/2012  . Asymmetric septal hypertrophy (HCC) 05/19/2011  . Hypertrophic cardiomyopathy (HCC) 05/19/2011    Past Surgical History:  Procedure Laterality Date  . BLADDER REPAIR  2014   tact    Family History  Problem Relation Age of Onset  . Alzheimer's disease Mother   .  Hypertension Mother   . Stroke Father   . Heart disease Father   . Heart disease Brother   . Hypertension Brother   . Diabetes Brother   . Cancer Daughter     breast  . Hypertension Daughter     Social History   Social History  . Marital status: Married    Spouse name: N/A  . Number of children: N/A  . Years of education: N/A   Occupational History  . Not on file.   Social History Main Topics  . Smoking status: Never Smoker  . Smokeless tobacco: Never Used  . Alcohol use No  . Drug use: No  . Sexual  activity: Not Currently   Other Topics Concern  . Not on file   Social History Narrative  . No narrative on file     Current Outpatient Prescriptions:  .  amiodarone (PACERONE) 200 MG tablet, Take 1 tablet by mouth daily., Disp: , Rfl:  .  amLODipine (NORVASC) 10 MG tablet, Take 1 tablet (10 mg total) by mouth daily., Disp: 90 tablet, Rfl: 1 .  atorvastatin (LIPITOR) 40 MG tablet, Take 1 tablet (40 mg total) by mouth every evening., Disp: 90 tablet, Rfl: 1 .  carvedilol (COREG) 25 MG tablet, Take 1 tablet (25 mg total) by mouth 2 (two) times daily., Disp: 180 tablet, Rfl: 1 .  diclofenac sodium (VOLTAREN) 1 % GEL, APPLY TO RIGHT HAND AND FINGER TWICE DAILY, Disp: 100 g, Rfl: 0 .  fluticasone (FLONASE) 50 MCG/ACT nasal spray, Place 2 sprays into both nostrils daily., Disp: 16 g, Rfl: 5 .  folic acid (FOLVITE) 400 MCG tablet, Take 1 tablet by mouth daily. Reported on 04/19/2015, Disp: , Rfl:  .  gabapentin (NEURONTIN) 300 MG capsule, , Disp: , Rfl:  .  irbesartan (AVAPRO) 300 MG tablet, TAKE ONE TABLET BY MOUTH EVERY DAY FOR FOR BLOOD PRESSURE, Disp: 90 tablet, Rfl: 1 .  loratadine (CLARITIN) 10 MG tablet, Take 1 tablet by mouth as needed. Reported on 04/19/2015, Disp: , Rfl:  .  PRADAXA 150 MG CAPS capsule, Take 1 tablet by mouth 2 (two) times daily., Disp: , Rfl:  .  traZODone (DESYREL) 50 MG tablet, Take 0.5-2 tablets (25-100 mg total) by mouth at bedtime as needed for sleep., Disp: 60 tablet, Rfl: 0 .  Vitamin D, Cholecalciferol, 1000 UNITS TABS, Take 1 tablet by mouth daily., Disp: , Rfl:   Current Facility-Administered Medications:  .  cyanocobalamin ((VITAMIN B-12)) injection 1,000 mcg, 1,000 mcg, Intramuscular, Once, Alba CoryKrichna Latarsha Zani, MD  Allergies  Allergen Reactions  . Nickel Dermatitis and Swelling  . Other Anaphylaxis    Uncoded Allergy. Allergen: SHELLFISH  . Ace Inhibitors Cough     ROS  Constitutional: Negative for fever or weight change.  Respiratory:No shortness of  breath, she has post-nasal drainage that causes a cough.   Cardiovascular: Negative for chest pain or palpitations.  Gastrointestinal: Negative for abdominal pain, no bowel changes.  Musculoskeletal: Negative for gait problem or joint swelling.  Skin: Negative for rash.  Neurological: positive for intermittent dizziness and  headache.  No other specific complaints in a complete review of systems (except as listed in HPI above).  Objective  Vitals:   01/23/16 1141  BP: 128/64  Pulse: 60  Resp: 16  Temp: 97.9 F (36.6 C)  SpO2: 97%  Weight: 166 lb 6 oz (75.5 kg)  Height: 5\' 5"  (1.651 m)    Body mass index is 27.69 kg/m.  Physical Exam  Constitutional: Patient appears well-developed and well-nourished. Obese  No distress.  HEENT: head atraumatic, normocephalic, pupils equal and reactive to light, ears normal TM bilaterally, neck supple, throat within normal limits Cardiovascular: Normal rate, regular rhythm and Systolic heart murmur 3/6 Trace BLE - ankle  edema. Pulmonary/Chest: Effort normal and breath sounds normal. No respiratory distress. Abdominal: Soft.  There is no tenderness. Psychiatric: Patient has a normal mood and affect. behavior is normal. Judgment and thought content normal. Neurological: no focal findings, normal alternate movements, and negative for nystagms  PHQ2/9: Depression screen [facility] 2/9 01/23/2016 08/17/2015 04/19/2015 01/03/2015 09/28/2014  Decreased Interest 0 0 0 0 0  Down, Depressed, Hopeless 0 0 0 0 0  PHQ - 2 Score 0 0 0 0 0    Fall Risk: Fall Risk  08/17/2015 04/19/2015 01/03/2015 09/28/2014  Falls in the past year? No No Yes Yes  Number falls in past yr: - - 1 1  Injury with Fall? - - No No     Functional Status Survey: Is the patient deaf or have difficulty hearing?: No Does the patient have difficulty seeing, even when wearing glasses/contacts?: No Does the patient have difficulty concentrating, remembering, or making decisions?: No Does the  patient have difficulty walking or climbing stairs?: Yes (vertigo) Does the patient have difficulty dressing or bathing?: No Does the patient have difficulty doing errands alone such as visiting a doctor's office or shopping?: No    Assessment & Plan  1. Carotid atherosclerosis, bilateral  - atorvastatin (LIPITOR) 40 MG tablet; Take 1 tablet (40 mg total) by mouth every evening.  Dispense: 90 tablet; Refill: 1  2. Need for influenza vaccination  - Flu vaccine HIGH DOSE PF (Fluzone High dose)  3. Vertigo  - Ambulatory referral to Physical Therapy  4. Paroxysmal a-fib (HCC)  Continue Pradaxa  5. Insomnia, persistent  - traZODone (DESYREL) 50 MG tablet; Take 0.5-2 tablets (25-100 mg total) by mouth at bedtime as needed for sleep.  Dispense: 60 tablet; Refill: 0  6. Benign essential HTN  - irbesartan (AVAPRO) 300 MG tablet; TAKE ONE TABLET BY MOUTH EVERY DAY FOR FOR BLOOD PRESSURE  Dispense: 90 tablet; Refill: 1 - amLODipine (NORVASC) 10 MG tablet; Take 1 tablet (10 mg total) by mouth daily.  Dispense: 90 tablet; Refill: 1 - carvedilol (COREG) 25 MG tablet; Take 1 tablet (25 mg total) by mouth 2 (two) times daily.  Dispense: 180 tablet; Refill: 1  7. Asymmetric septal hypertrophy (HCC)  Continue medication and follow up with Dr Regino Schultze  8. Dyslipidemia  - atorvastatin (LIPITOR) 40 MG tablet; Take 1 tablet (40 mg total) by mouth every evening.  Dispense: 90 tablet; Refill: 1   9. B12 deficiency  - cyanocobalamin ((VITAMIN B-12)) injection 1,000 mcg; Inject 1 mL (1,000 mcg total) into the muscle once.

## 2016-01-23 NOTE — Progress Notes (Signed)
B12 and BP Check.

## 2016-02-01 ENCOUNTER — Encounter: Payer: Self-pay | Admitting: Physical Therapy

## 2016-02-01 ENCOUNTER — Ambulatory Visit: Payer: Medicare Other | Attending: Family Medicine | Admitting: Physical Therapy

## 2016-02-01 DIAGNOSIS — R42 Dizziness and giddiness: Secondary | ICD-10-CM | POA: Insufficient documentation

## 2016-02-01 NOTE — Therapy (Signed)
[facility] MAIN [facility] SERVICES [address] [place], [place], [address] Phone: [phone]   Fax:  [phone]  Physical Therapy Evaluation  Patient Details  Name: Sharon Bullock MRN: [id number] Date of Birth: [birth year] Referring Provider: Dr. Carlynn Purl  Encounter Date: 02/01/2016      PT End of Session - 02/01/16 1223    Visit Number 1   Number of Visits 9   Date for PT Re-Evaluation 04/04/16   PT Start Time 1030   PT Stop Time 1128   PT Time Calculation (min) 58 min   Equipment Utilized During Treatment Gait belt   Activity Tolerance Patient tolerated treatment well   Behavior During Therapy [facility] for tasks assessed/performed      Past Medical History:  Diagnosis Date  . Allergy   . GERD (gastroesophageal reflux disease)   . Hypertension   . Insomnia     Past Surgical History:  Procedure Laterality Date  . BLADDER REPAIR  2014   tact    There were no vitals filed for this visit.       Subjective Assessment - 02/01/16 1222    Subjective Patient arrives to clinic with reports of 5-6/10 dizziness and states that rushing around trying to get to the clinic brought on dizziness symptoms.    Pertinent History Patient reports that she began to have vertigo about 4 weeks ago. Pt states that she went to the [facility] ER after suffering with the vertigo for 2-3 weeks because she stated it continued to get worse instead of better so she went to the ER for care. Patient reports that she recently saw her cardiologist MD in [facility]. Pt states she was referred for vestibular therapy for the vertigo. Patient states she has not seen an ENT in regards to her dizziness symptoms. Patient reports that she is having vertigo, imbalance and unsteadiness. Pt reports the vertigo occurs when she is lying down when she turns to her right in bed and when she goes to sit up in bed. Pt states that the episodes of vertigo are happening almost daily. Patient reports  that the vertigo lasts about a minute but states she feels bad afterwards which can last hours. Patient states that provocative factors include rolling over in bed, sitting up in bed, standing up with her eyes closed, tipping her head back, looking up. Patient reports that moving around and trying to do her normal daily activities helps to relieve her symptoms. Patient's symptoms appear to be motion provoked, positional and intermittent. Patient has started to guard her movements and move more slowly to avoid onset of her symptoms. Pt reports she has had episodes of dizziness in the past about  12-13 years ago. Pt received vestibular therapy and patient states that it helped at the time. Patient states she reports sinus drainage that is constant 24/7 which has been occurring for the past 5 months.    Patient Stated Goals patient would like to be able to roll over and sit up in bed without dizziness and vertigo. Patient would like to be able to turn to the right and to be able to reach up without bringing on symptoms of dizziness and to be able to reach up.   Currently in Pain? --  none stated        VESTIBULAR AND BALANCE EVALUATION  Onset Date: 4 weeks ago   HISTORY:  Subjective history of current problem:  Patient reports that she began to have vertigo about  4 weeks ago. Pt states that she went to [facility] after suffering with the vertigo for 2-3 weeks because she stated it continued to get worse instead of better so she went to the ER for care. Patient reports that [facility] Pt states she was referred for vestibular therapy for the vertigo. Patient states she has not seen an ENT in regards to her dizziness symptoms. Patient reports that she is having vertigo, imbalance and unsteadiness. Pt reports the vertigo occurs when she is lying down when she turns to her right in bed and when she goes to sit up in bed. Pt states that the episodes of vertigo are  happening almost daily. Patient reports that the vertigo lasts about a minute but states she feels bad afterwards which can last hours. Patient states that provocative factors include rolling over in bed, sitting up in bed, standing up with her eyes closed, tipping her head back, looking up. Patient reports that moving around and trying to do her normal daily activities helps to relieve her symptoms. Patient's symptoms appear to be motion provoked, positional and intermittent. Patient has started to guard her movements and move more slowly to avoid onset of her symptoms. Pt reports she has had episodes of dizziness in the past about  12-13 years ago. Pt received vestibular therapy and patient states that it helped at the time. Patient states she reports sinus drainage that is constant 24/7 which has been occurring for the past 5 months.    Description of dizziness: vertigo, unsteadiness, general unsteadiness Frequency: gets vertigo when lying down and turns to the right and in her sleep.  daily Duration: close to minutes  Symptom nature: motion provoked, positional, intermittent   Provocative Factors: moving around without looking up. Easing Factors: worse when she lies down and tries to get up. She gets headaches, stomach cramps with nausea and feels her eyes start rolling. Pt reports prior time she had vertigo she did not have nausea and headache. Bed spins with sitting up which she states is the worse and turning over in bed to the right. Standing up with eyes closed causes dizziness. Got dizzy at beauty shop when head was tipped back.  Reaching  Progression of symptoms:  Slightly better History of similar episodes: yes  Falls (yes/no): no Number of falls in past 6 months: 0  Auditory complaints (tinnitus, pain, drainage): tinnitus chronic bilaterally, denies pain and drainage  Vision (last eye exam, diplopia, recent changes): blurring vision when having vertigo denies other vision problems.    Current Symptoms:  Review of systems negative for red flags.   EXAMINATION  POSTURE:  Rounded shoulers      COORDINATION: Finger to Nose:   Normal Past Pointing:     Normal   MUSCULOSKELETAL SCREEN:  Cervical Spine ROM: Active ROM cervical spine L/R rotation, flexion and extension WFL. Pt reports extension brings on mild dizziness symptoms  Functional Mobility: patient able to perform sup to/from sit independently and sit to stand independently.   Gait: Patient arrives to clinic ambulating without AD. Pt ambulates with decreased cadence and decreased scanning of the visual environment. Patient demonstrates mild veering with amb with head turns. Scanning of visual environment with gait is: poor  Balance: Patient demonstrates difficulty with ambulation with horiz and vert head turns, body turns. Patient reports difficulty with standing with EC however this was not tested this date.   OCULOMOTOR / VESTIBULAR TESTING:  Oculomotor Exam-  Room Light  Normal Abnormal Comments  Ocular Alignment N    Ocular ROM N    Spontaneous Nystagmus N    End-Gaze Nystagmus N    Smooth Pursuit  ABN Saccadic and nystagmus observed with right to left gaze  Saccades  ABN Corrective saccades   VOR N    VOR Cancellation N    Left Head Thrust   Deferred secondary to positive L and R Dix-Hallpike testing  Right Head Thrust   Deferred secondary to positive L and R Dix-Hallpike testing    BPPV TESTS:  Symptoms Duration Intensity Nystagmus  L Dix-Hallpike vertigo < 1 minute 8/10 Upbeating, left torsional   R Dix-Hallpike vertigo <1 minute 9/10 upbeating difficult to assess torsional component because patient was squeezing her eyes shut     FUNCTIONAL OUTCOME MEASURES:  Results Comments  DHI  Patient arrived to clinic and had no completed form; form provided for patient and asked her to complete at home and to bring back to clinic at next visit.   ABC Scale  Patient arrived to clinic and had no  completed form; form provided for patient and asked her to complete at home and to bring back to clinic at next visit.   DGI 18/24 Falls risk; in need of intervention    Canalith Repositioning: Explained and demonstrated BPPV, Dix-Hallpike test and canalith repositioning maneuver (CRT). Performed Left Dix-Hallpike test and it was positive with left upbeating torsional nystagmus of short duration. Patient reported 8/10 dizziness.  Performed Right Dix-Hallpike test and it was positive with upbeating nystagmus of short duration, but difficult to ascertain torsional component secondary to patient squeezing her eyes shut. Performed canalith repositioning maneuver (CRT) as patient reported right side was worse than left side stating the right side was 9/10 vertigo. Repeated Right CRT for a total of 2 maneuvers with retesting between maneuvers        [facility] PT Assessment - 02/01/16 1249      Assessment   Medical Diagnosis vertigo   Referring Provider Dr. Carlynn Purl   Onset Date/Surgical Date 01/02/16   Prior Therapy yes 13-14 years ago had vestibular rehab which patient reports was helpful     Precautions   Precautions Fall     Restrictions   Weight Bearing Restrictions No     Balance Screen   Has the patient fallen in the past 6 months No   Has the patient had a decrease in activity level because of a fear of falling?  Yes   Is the patient reluctant to leave their home because of a fear of falling?  No     Home Environment   Living Environment Private residence   Living Arrangements Spouse/significant other   Available Help at Discharge Family   Type of Home House   Home Access Ramped entrance   Home Layout One level   Home Equipment [place] - single point     Prior Function   Level of Independence Independent with basic ADLs;Independent;Independent with gait;Independent with community mobility without device     Cognition   Overall Cognitive Status Within Functional Limits for tasks  assessed     Balance   Balance Assessed Yes     Standardized Balance Assessment   Standardized Balance Assessment Dynamic Gait Index     Dynamic Gait Index   Level Surface Normal   Change in Gait Speed Normal   Gait with Horizontal Head Turns Mild Impairment   Gait with Vertical Head Turns Mild Impairment  Gait and Pivot Turn Moderate Impairment   Step Over Obstacle Normal   Step Around Obstacles Normal   Steps Moderate Impairment   Total Score 18             PT Education - 03-01-2016 1016    Education provided Yes   Person(s) Educated Patient   Methods Explanation;Demonstration;Handout   Comprehension Verbalized understanding             PT Long Term Goals - 03/01/2016 1235      PT LONG TERM GOAL #1   Title Patient will report 50% or greater improvement in her symptoms of dizziness and imbalance with provoking motions or positions by 04/04/2016   Baseline patient reports reaching and looking up and sitting up bring on her dizziness   Time 8   Period Weeks   Status New     PT LONG TERM GOAL #2   Title Patient reports no vertigo with provoking motions or positions by 04/04/2016.   Baseline rolling over in bed and sitting up in bed bring on vertigo per pt report.   Time 8   Period Weeks   Status New     PT LONG TERM GOAL #3   Title Patient will demonstrate reduced falls risk as evidenced by Dynamic Gait Index (DGI) of 21/24 or greater by 04/04/2016.   Baseline scored 18/24   Time 8   Period Weeks   Status New               Plan - 2016/03/01 1224    Clinical Impression Statement Patient presents to clinic with complaints of vertigo brought on by changes in position including rolling to the right in bed, looking up and sitting up from supine position. Patient reports taht vertigo lasts minutes but that she feels unwell for longer up to hours afterwards. Pt reports nausea at times related to her vertigo. Patient with bilateral positive Dix-Hallpike  testing this date with nystagmus observed in room light and patient reported this reproduced her vertigo symptoms. Positive Dix-Hallpike testing could be indicative of bilateral posterior canal BPPV. In addition, patient was noted to have abnormal smooth pursuits and saccades with vestibular-ocular testing which could indicate potential central signs contributing to dizziness and imbalance symptoms. Patient would benefit from vestibular rehab to perform canalith repositioning maneuvers, to help reduce patient's falls risk, address goals and to help reduce patient's subjective symptoms of vertigo and imbalance.     Rehab Potential Good   Clinical Impairments Affecting Rehab Potential positive indicators: motivated, family support, has recieved vestibular therapy in past and reports it was helpful;   negative indicators: potential central signs with vestibular testing in addition to the bilateral + BPPV tests   PT Frequency Other (comment)  1-2 times a week   PT Duration 8 weeks   PT Treatment/Interventions Vestibular;Canalith Repostioning   PT Next Visit Plan Repeat Dix-Hallpike test Right ear and perform CRT if indicated;  L ear with + Dix-Hallpie testing as well and will need CRT   Consulted and Agree with Plan of Care Patient      Patient will benefit from skilled therapeutic intervention in order to improve the following deficits and impairments:  Decreased balance, Dizziness, Difficulty walking  Visit Diagnosis: Dizziness and giddiness      G-Codes - 03/01/2016 1237    Functional Assessment Tool Used clinical judgment, DGI   Functional Limitation Mobility: Walking and moving around   Mobility: Walking and Moving Around Current Status (T6256) At least  20 percent but less than 40 percent impaired, limited or restricted   Mobility: Walking and Moving Around Goal Status [phone]) At least 1 percent but less than 20 percent impaired, limited or restricted       Problem List Patient Active  Problem List   Diagnosis Date Noted  . Carotid atherosclerosis, bilateral 01/23/2016  . Hearing loss 08/17/2015  . Mitral regurgitation 04/19/2015  . Pernicious anemia 09/27/2014  . Paroxysmal a-fib (HCC) 09/27/2014  . Benign essential HTN 09/27/2014  . Perennial allergic rhinitis 09/27/2014  . Chronic anemia 09/27/2014  . Insomnia, persistent 09/27/2014  . Hypertensive pulmonary vascular disease 09/27/2014  . B12 deficiency 09/27/2014  . Diverticulosis of colon 09/27/2014  . Dyslipidemia 09/27/2014  . Edema extremities 09/27/2014  . Gastric reflux 09/27/2014  . Bilateral hearing loss 09/27/2014  . Polypharmacy 09/27/2014  . Pelvic pain in female 09/27/2014  . MI (mitral incompetence) 09/27/2014  . Internal hemorrhoids 09/27/2014  . Osteopenia 09/27/2014  . Arthralgia of shoulder 09/27/2014  . Finger tendinitis 09/27/2014  . Urge incontinence 09/27/2014  . Cervical prolapse 09/27/2014  . Vertigo 09/27/2014  . Vitamin D deficiency 09/27/2014  . Bladder cystocele 04/15/2012  . Asymmetric septal hypertrophy (HCC) 05/19/2011  . Hypertrophic cardiomyopathy (HCC) 05/19/2011   Mardelle Matte PT, DPT Tyechia Allmendinger 02/01/2016, 1:10 PM  [place] [facility] MAIN [facility] SERVICES [address] [place], [place], [address] Phone: [phone]   Fax:  [phone]  Name: Sharon Bullock MRN: [id number] Date of Birth: [birth year]

## 2016-02-05 DIAGNOSIS — I6521 Occlusion and stenosis of right carotid artery: Secondary | ICD-10-CM | POA: Diagnosis not present

## 2016-02-05 DIAGNOSIS — I6523 Occlusion and stenosis of bilateral carotid arteries: Secondary | ICD-10-CM | POA: Diagnosis not present

## 2016-02-05 DIAGNOSIS — R42 Dizziness and giddiness: Secondary | ICD-10-CM | POA: Diagnosis not present

## 2016-02-05 DIAGNOSIS — I10 Essential (primary) hypertension: Secondary | ICD-10-CM | POA: Diagnosis not present

## 2016-02-05 DIAGNOSIS — I4891 Unspecified atrial fibrillation: Secondary | ICD-10-CM | POA: Diagnosis not present

## 2016-02-05 DIAGNOSIS — Z8249 Family history of ischemic heart disease and other diseases of the circulatory system: Secondary | ICD-10-CM | POA: Diagnosis not present

## 2016-02-05 DIAGNOSIS — E785 Hyperlipidemia, unspecified: Secondary | ICD-10-CM | POA: Diagnosis not present

## 2016-02-08 ENCOUNTER — Encounter: Payer: Self-pay | Admitting: Physical Therapy

## 2016-02-08 ENCOUNTER — Ambulatory Visit: Payer: Medicare Other | Admitting: Physical Therapy

## 2016-02-08 DIAGNOSIS — R42 Dizziness and giddiness: Secondary | ICD-10-CM

## 2016-02-08 NOTE — Therapy (Signed)
[place] [facility] MAIN [facility] SERVICES [address] [place], [place], [address] Phone: [phone]   Fax:  [phone]  Physical Therapy Treatment  Patient Details  Name: Sharon Bullock MRN: [id number] Date of Birth: 03-Nov-1933 Referring Provider: Dr. Carlynn Purl  Encounter Date: 02/08/2016      PT End of Session - 02/08/16 1128    Visit Number 2   Number of Visits 9   Date for PT Re-Evaluation 04/04/16   PT Start Time 1123   PT Stop Time 1158   PT Time Calculation (min) 35 min   Equipment Utilized During Treatment --   Activity Tolerance Patient tolerated treatment well   Behavior During Therapy [facility] for tasks assessed/performed      Past Medical History:  Diagnosis Date  . Allergy   . GERD (gastroesophageal reflux disease)   . Hypertension   . Insomnia     Past Surgical History:  Procedure Laterality Date  . BLADDER REPAIR  2014   tact    There were no vitals filed for this visit.      Subjective Assessment - 02/08/16 1127    Subjective Patient states she felt much better this past week until she went grocery shopping mid week. Pt states she did a lot of bending while getting groceries.    Patient is accompained by: Family member   Pertinent History Patient reports that she began to have vertigo about 4 weeks ago. Pt states that she went to the [facility] ER after suffering with the vertigo for 2-3 weeks because she stated it continued to get worse instead of better so she went to the ER for care. Patient reports that she recently saw her cardiologist MD in [facility]. Pt states she was referred for vestibular therapy for the vertigo. Patient states she has not seen an ENT in regards to her dizziness symptoms. Patient reports that she is having vertigo, imbalance and unsteadiness. Pt reports the vertigo occurs when she is lying down when she turns to her right in bed and when she goes to sit up in bed. Pt states that the episodes of vertigo are  happening almost daily. Patient reports that the vertigo lasts about a minute but states she feels bad afterwards which can last hours. Patient states that provocative factors include rolling over in bed, sitting up in bed, standing up with her eyes closed, tipping her head back, looking up. Patient reports that moving around and trying to do her normal daily activities helps to relieve her symptoms. Patient's symptoms appear to be motion provoked, positional and intermittent. Patient has started to guard her movements and move more slowly to avoid onset of her symptoms. Pt reports she has had episodes of dizziness in the past about  12-13 years ago. Pt received vestibular therapy and patient states that it helped at the time. Patient states she reports sinus drainage that is constant 24/7 which has been occurring for the past 5 months.    Patient Stated Goals patient would like to be able to roll over and sit up in bed without dizziness and vertigo. Patient would like to be able to turn to the right and to be able to reach up without bringing on symptoms of dizziness and to be able to reach up.   Currently in Pain? Yes   Pain Location Back  pt states she started sleeping on high pillows because of the dizziness and she thinks this iritated her back    Pain Orientation  Lower   Pain Type Acute pain   Pain Onset Today       Canalith Repositioning:  Performed Left Dix-Hallpike test and it was positive with left upbeating torsional nystagmus of short duration after about 10 second latency period. Performed canalith repositioning maneuver (CRT) Repeated L CRT for a total of 3 maneuvers with retesting between maneuvers. Patient denied vertigo and no nystagmus was observed with 2 and 3 CRT. Patient reported 7/10 vertigo with first CRT and denied vertigo with second and third CRT.  Performed right Dix-Hallpike test and it was negative with patient denying vertigo and no nystagmus observed.  Patient provided  with handout from [email or web address] in regards to BPPV.         PT Education - 02/08/16 1128    Education provided Yes   Education Details provided handout educational material on BPPV from [email or web address]; had provided last week but patient reported that the print was too light and she had a difficult time reading therefore printed it out again in darker print.    Person(s) Educated Patient;Spouse   Methods Handout;Explanation   Comprehension Verbalized understanding             PT Long Term Goals - 02/01/16 1235      PT LONG TERM GOAL #1   Title Patient will report 50% or greater improvement in her symptoms of dizziness and imbalance with provoking motions or positions by 04/04/2016   Baseline patient reports reaching and looking up and sitting up bring on her dizziness   Time 8   Period Weeks   Status New     PT LONG TERM GOAL #2   Title Patient reports no vertigo with provoking motions or positions by 04/04/2016.   Baseline rolling over in bed and sitting up in bed bring on vertigo per pt report.   Time 8   Period Weeks   Status New     PT LONG TERM GOAL #3   Title Patient will demonstrate reduced falls risk as evidenced by Dynamic Gait Index (DGI) of 21/24 or greater by 04/04/2016.   Baseline scored 18/24   Time 8   Period Weeks   Status New               Plan - 02/08/16 1128    Clinical Impression Statement Patient returns to clinic and reports that she has had significant improvement of her vertigo symptoms but did states she experienced dizziness when she went grocery shopping this past week. Patient with negative right Dix-Hallpike test this date which was positive and last appointment and therefore had performed canalith repositioning maneuver for the right ear at last visit. Patient with positive left Dix-Hallpike test with left upbeating, torsional nystagmus observed. Patient responded well with canalith repositioning maneuver reporting 7/10 initially and  upon second and third maneuver patient reporting 0/10 vertigo and no nystagmus was observed during 2 and 3rd maneuvers. Will plan on re-testing bilateral Dix-Hallpike tests next session and performing canalith repositioning maneuvers if indicated.    Rehab Potential Good   Clinical Impairments Affecting Rehab Potential positive indicators: motivated, family support, has recieved vestibular therapy in past and reports it was helpful;   negative indicators: potential central signs with vestibular testing in addition to the bilateral + BPPV tests   PT Frequency Other (comment)  1-2 times a week   PT Duration 8 weeks   PT Treatment/Interventions Vestibular;Canalith Repostioning   PT Next Visit Plan Repeat Dix-Hallpike test right and left ears and perform  CRT if indicated   Consulted and Agree with Plan of Care Patient      Patient will benefit from skilled therapeutic intervention in order to improve the following deficits and impairments:  Decreased balance, Dizziness, Difficulty walking  Visit Diagnosis: Dizziness and giddiness     Problem List Patient Active Problem List   Diagnosis Date Noted  . Carotid atherosclerosis, bilateral 01/23/2016  . Hearing loss 08/17/2015  . Mitral regurgitation 04/19/2015  . Pernicious anemia 09/27/2014  . Paroxysmal a-fib (HCC) 09/27/2014  . Benign essential HTN 09/27/2014  . Perennial allergic rhinitis 09/27/2014  . Chronic anemia 09/27/2014  . Insomnia, persistent 09/27/2014  . Hypertensive pulmonary vascular disease 09/27/2014  . B12 deficiency 09/27/2014  . Diverticulosis of colon 09/27/2014  . Dyslipidemia 09/27/2014  . Edema extremities 09/27/2014  . Gastric reflux 09/27/2014  . Bilateral hearing loss 09/27/2014  . Polypharmacy 09/27/2014  . Pelvic pain in female 09/27/2014  . MI (mitral incompetence) 09/27/2014  . Internal hemorrhoids 09/27/2014  . Osteopenia 09/27/2014  . Arthralgia of shoulder 09/27/2014  . Finger tendinitis  09/27/2014  . Urge incontinence 09/27/2014  . Cervical prolapse 09/27/2014  . Vertigo 09/27/2014  . Vitamin D deficiency 09/27/2014  . Bladder cystocele 04/15/2012  . Asymmetric septal hypertrophy (HCC) 05/19/2011  . Hypertrophic cardiomyopathy (HCC) 05/19/2011    Damacio Weisgerber 02/08/2016, 3:31 PM  [place] [facility] [address] [place], [place], [address] Phone: [phone]   Fax:  [phone]  Name: Beryle FlockZeola Mkrtchyan MRN: [id number] Date of Birth: Mar 30, 1934

## 2016-02-11 DIAGNOSIS — R61 Generalized hyperhidrosis: Secondary | ICD-10-CM | POA: Diagnosis not present

## 2016-02-11 DIAGNOSIS — N3941 Urge incontinence: Secondary | ICD-10-CM | POA: Diagnosis not present

## 2016-02-11 DIAGNOSIS — R079 Chest pain, unspecified: Secondary | ICD-10-CM | POA: Diagnosis not present

## 2016-02-11 DIAGNOSIS — R001 Bradycardia, unspecified: Secondary | ICD-10-CM | POA: Diagnosis not present

## 2016-02-11 DIAGNOSIS — R9431 Abnormal electrocardiogram [ECG] [EKG]: Secondary | ICD-10-CM | POA: Diagnosis not present

## 2016-02-11 DIAGNOSIS — R112 Nausea with vomiting, unspecified: Secondary | ICD-10-CM | POA: Diagnosis not present

## 2016-02-11 DIAGNOSIS — I440 Atrioventricular block, first degree: Secondary | ICD-10-CM | POA: Diagnosis not present

## 2016-02-11 DIAGNOSIS — I517 Cardiomegaly: Secondary | ICD-10-CM | POA: Diagnosis not present

## 2016-02-11 DIAGNOSIS — I480 Paroxysmal atrial fibrillation: Secondary | ICD-10-CM | POA: Diagnosis not present

## 2016-02-11 DIAGNOSIS — K862 Cyst of pancreas: Secondary | ICD-10-CM | POA: Diagnosis not present

## 2016-02-11 DIAGNOSIS — K219 Gastro-esophageal reflux disease without esophagitis: Secondary | ICD-10-CM | POA: Diagnosis not present

## 2016-02-11 DIAGNOSIS — I313 Pericardial effusion (noninflammatory): Secondary | ICD-10-CM | POA: Diagnosis not present

## 2016-02-11 DIAGNOSIS — R1010 Upper abdominal pain, unspecified: Secondary | ICD-10-CM | POA: Diagnosis not present

## 2016-02-11 DIAGNOSIS — R600 Localized edema: Secondary | ICD-10-CM | POA: Diagnosis not present

## 2016-02-11 DIAGNOSIS — J90 Pleural effusion, not elsewhere classified: Secondary | ICD-10-CM | POA: Diagnosis not present

## 2016-02-11 DIAGNOSIS — I10 Essential (primary) hypertension: Secondary | ICD-10-CM | POA: Diagnosis not present

## 2016-02-11 DIAGNOSIS — J9811 Atelectasis: Secondary | ICD-10-CM | POA: Diagnosis not present

## 2016-02-11 DIAGNOSIS — K551 Chronic vascular disorders of intestine: Secondary | ICD-10-CM | POA: Diagnosis not present

## 2016-02-11 DIAGNOSIS — R0981 Nasal congestion: Secondary | ICD-10-CM | POA: Diagnosis not present

## 2016-02-11 DIAGNOSIS — I422 Other hypertrophic cardiomyopathy: Secondary | ICD-10-CM | POA: Diagnosis not present

## 2016-02-11 DIAGNOSIS — R0982 Postnasal drip: Secondary | ICD-10-CM | POA: Diagnosis not present

## 2016-02-11 DIAGNOSIS — E785 Hyperlipidemia, unspecified: Secondary | ICD-10-CM | POA: Diagnosis not present

## 2016-02-11 DIAGNOSIS — I340 Nonrheumatic mitral (valve) insufficiency: Secondary | ICD-10-CM | POA: Diagnosis not present

## 2016-02-11 DIAGNOSIS — R0789 Other chest pain: Secondary | ICD-10-CM | POA: Diagnosis not present

## 2016-02-13 ENCOUNTER — Ambulatory Visit: Payer: Medicare Other | Admitting: Physical Therapy

## 2016-02-20 ENCOUNTER — Other Ambulatory Visit: Payer: Self-pay | Admitting: Family Medicine

## 2016-02-20 NOTE — Telephone Encounter (Signed)
Patient requesting refill of Zantac to [facility].

## 2016-02-22 ENCOUNTER — Telehealth: Payer: Self-pay | Admitting: Family Medicine

## 2016-02-22 ENCOUNTER — Encounter: Payer: Medicare Other | Admitting: Physical Therapy

## 2016-02-22 DIAGNOSIS — R42 Dizziness and giddiness: Secondary | ICD-10-CM

## 2016-02-22 NOTE — Telephone Encounter (Signed)
Patient was doing physical therapy and ended up in the hospital for 2 days (due to acid reflux and sinuses. When she got out of the hospital she called to set up appointment to start back on her physical therapy, however they told her that she would need another prescription to complete it. Please send to Dr Eulah Pont at [facility]

## 2016-02-28 ENCOUNTER — Encounter: Payer: Self-pay | Admitting: Family Medicine

## 2016-02-28 ENCOUNTER — Ambulatory Visit (INDEPENDENT_AMBULATORY_CARE_PROVIDER_SITE_OTHER): Payer: Medicare Other | Admitting: Family Medicine

## 2016-02-28 VITALS — BP 132/64 | HR 65 | Temp 98.0°F | Resp 16 | Ht 65.0 in | Wt 166.4 lb

## 2016-02-28 DIAGNOSIS — R42 Dizziness and giddiness: Secondary | ICD-10-CM

## 2016-02-28 DIAGNOSIS — I313 Pericardial effusion (noninflammatory): Secondary | ICD-10-CM | POA: Diagnosis not present

## 2016-02-28 DIAGNOSIS — R938 Abnormal findings on diagnostic imaging of other specified body structures: Secondary | ICD-10-CM

## 2016-02-28 DIAGNOSIS — E538 Deficiency of other specified B group vitamins: Secondary | ICD-10-CM

## 2016-02-28 DIAGNOSIS — K551 Chronic vascular disorders of intestine: Secondary | ICD-10-CM

## 2016-02-28 DIAGNOSIS — I3139 Other pericardial effusion (noninflammatory): Secondary | ICD-10-CM

## 2016-02-28 DIAGNOSIS — K862 Cyst of pancreas: Secondary | ICD-10-CM

## 2016-02-28 DIAGNOSIS — I6523 Occlusion and stenosis of bilateral carotid arteries: Secondary | ICD-10-CM

## 2016-02-28 DIAGNOSIS — R9389 Abnormal findings on diagnostic imaging of other specified body structures: Secondary | ICD-10-CM

## 2016-02-28 MED ORDER — CYANOCOBALAMIN 1000 MCG/ML IJ SOLN
1000.0000 ug | Freq: Once | INTRAMUSCULAR | Status: AC
  Administered 2016-02-28: 1000 ug via INTRAMUSCULAR

## 2016-02-28 NOTE — Progress Notes (Signed)
Name: Sharon Bullock   MRN: [id number]    DOB: 26-Dec-1933   Date:02/28/2016       Progress Note  Subjective  Chief Complaint  Chief Complaint  Patient presents with  . Hospitalization Follow-up    acid reflux and sinus issues  . B12 Injection    HPI  EC visit: she went to [facility] on October 30 th for chest pain and nausea, dysphagia and lack of appetite. She had multiple tests done. 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 She was given diagnosis of reflux and sent home. She states she is feeling better, she has been able to eat well, no nausea, no longer has dysphagia. No diarrhea or abdominal pain. No post-menopausal bleeding. She sees a Physiological scientist at [facility]. Dr. Larwance Rote and states she will follow up with him about mesenteric insufficiency.   Vertigo: seen by ENT and was referred to have vestibular therapy, she started the treatment but had to stop after visit to [facility] and needs a new order to go back. She states symptoms have improved with therapy and would like to finish treatment.   B12 deficiency: she still has anemia, and is due for B12 injections  Patient Active Problem List   Diagnosis Date Noted  . Mesenteric vascular insufficiency (HCC) 02/28/2016  . Pancreatic cyst 02/28/2016  . Increased endometrial stripe thickness 02/28/2016  . Carotid atherosclerosis, bilateral 01/23/2016  . Hearing loss 08/17/2015  . Mitral regurgitation 04/19/2015  . Pernicious anemia 09/27/2014  . Paroxysmal a-fib (HCC) 09/27/2014  . Benign essential HTN 09/27/2014  . Perennial allergic rhinitis 09/27/2014  . Chronic anemia 09/27/2014  . Insomnia, persistent 09/27/2014  . Hypertensive pulmonary vascular disease 09/27/2014  . B12 deficiency 09/27/2014  . Diverticulosis of colon 09/27/2014  . Dyslipidemia 09/27/2014  . Edema extremities 09/27/2014  . Gastric reflux 09/27/2014  .  Bilateral hearing loss 09/27/2014  . Polypharmacy 09/27/2014  . Pelvic pain in female 09/27/2014  . MI (mitral incompetence) 09/27/2014  . Internal hemorrhoids 09/27/2014  . Osteopenia 09/27/2014  . Arthralgia of shoulder 09/27/2014  . Finger tendinitis 09/27/2014  . Urge incontinence 09/27/2014  . Cervical prolapse 09/27/2014  . Vertigo 09/27/2014  . Vitamin D deficiency 09/27/2014  . Bladder cystocele 04/15/2012  . Asymmetric septal hypertrophy (HCC) 05/19/2011  . Hypertrophic cardiomyopathy (HCC) 05/19/2011    Past Surgical History:  Procedure Laterality Date  . BLADDER REPAIR  2014   tact    Family History  Problem Relation Age of Onset  . Alzheimer's disease Mother   . Hypertension Mother   . Stroke Father   . Heart disease Father   . Heart disease Brother   . Hypertension Brother   . Diabetes Brother   . Cancer Daughter     breast  . Hypertension Daughter     Social History   Social History  . Marital status: Married    Spouse name: N/A  . Number of children: N/A  . Years of education: N/A   Occupational History  . Not on file.   Social History Main Topics  . Smoking status: Never Smoker  . Smokeless tobacco: Never Used  . Alcohol use No  . Drug use: No  . Sexual activity: Not Currently   Other Topics Concern  . Not on file   Social History Narrative  . No narrative on file     Current Outpatient Prescriptions:  .  amiodarone (PACERONE) 200 MG tablet, Take 1 tablet by mouth daily., Disp: , Rfl:  .  amLODipine (NORVASC) 10 MG tablet, Take 1 tablet (10 mg total) by mouth daily., Disp: 90 tablet, Rfl: 1 .  atorvastatin (LIPITOR) 40 MG tablet, Take 1 tablet (40 mg total) by mouth every evening., Disp: 90 tablet, Rfl: 1 .  carvedilol (COREG) 25 MG tablet, Take 1 tablet (25 mg total) by mouth 2 (two) times daily., Disp: 180 tablet, Rfl: 1 .  diclofenac sodium (VOLTAREN) 1 % GEL, APPLY TO RIGHT HAND AND FINGER TWICE DAILY, Disp: 100 g, Rfl: 0 .   fluticasone (FLONASE) 50 MCG/ACT nasal spray, Place 2 sprays into both nostrils daily., Disp: 16 g, Rfl: 5 .  folic acid (FOLVITE) 400 MCG tablet, Take 1 tablet by mouth daily. Reported on 04/19/2015, Disp: , Rfl:  .  gabapentin (NEURONTIN) 300 MG capsule, , Disp: , Rfl:  .  irbesartan (AVAPRO) 300 MG tablet, TAKE ONE TABLET BY MOUTH EVERY DAY FOR FOR BLOOD PRESSURE, Disp: 90 tablet, Rfl: 1 .  loratadine (CLARITIN) 10 MG tablet, Take 1 tablet by mouth as needed. Reported on 04/19/2015, Disp: , Rfl:  .  PRADAXA 150 MG CAPS capsule, Take 1 tablet by mouth 2 (two) times daily., Disp: , Rfl:  .  ranitidine (ZANTAC) 150 MG tablet, TAKE 1 TABLET TWICE DAILY IN PLACE OF OMEPRAZOLE FOR GERD, Disp: 180 tablet, Rfl: 0 .  traZODone (DESYREL) 50 MG tablet, Take 0.5-2 tablets (25-100 mg total) by mouth at bedtime as needed for sleep., Disp: 60 tablet, Rfl: 0 .  Vitamin D, Cholecalciferol, 1000 UNITS TABS, Take 1 tablet by mouth daily., Disp: , Rfl:   Allergies  Allergen Reactions  . Nickel Dermatitis and Swelling  . Other Anaphylaxis    Uncoded Allergy. Allergen: SHELLFISH  . Ace Inhibitors Cough     ROS  Ten systems reviewed and is negative except as mentioned in HPI   Objective  Vitals:   02/28/16 1122  BP: 132/64  Pulse: 65  Resp: 16  Temp: 98 F (36.7 C)  SpO2: 97%  Weight: 166 lb 6 oz (75.5 kg)  Height: 5\' 5"  (1.651 m)    Body mass index is 27.69 kg/m.  Physical Exam  Constitutional: Patient appears well-developed and well-nourished. Obese  No distress.  HEENT: head atraumatic, normocephalic, pupils equal and reactive to light, ears normal TM bilaterally, neck supple, throat within normal limits Cardiovascular: Normal rate, regular rhythm and Systolic heart murmur 3/6 Trace BLE - ankle  edema. Pulmonary/Chest: Effort normal and breath sounds normal. No respiratory distress. Abdominal: Soft.  There is no tenderness. Psychiatric: Patient has a normal mood and affect. behavior is  normal. Judgment and thought content normal. Neurological: no focal findings, normal alternate movements, and negative for nystagms  PHQ2/9: Depression screen [facility] 2/9 01/23/2016 08/17/2015 04/19/2015 01/03/2015 09/28/2014  Decreased Interest 0 0 0 0 0  Down, Depressed, Hopeless 0 0 0 0 0  PHQ - 2 Score 0 0 0 0 0     Fall Risk: Fall Risk  08/17/2015 04/19/2015 01/03/2015 09/28/2014  Falls in the past year? No No Yes Yes  Number falls in past yr: - - 1 1  Injury with Fall? - - No No    Assessment & Plan  1. Mesenteric vascular insufficiency (HCC)  She sees vascular surgeon, has afib, is on statin therapy and blood thinners, unlikely the cause of her visit to [facility]  2. Pancreatic cyst  Discussed repeating studies, but  also explained that with her age and risk factors therapy for cancer would be very dangerous for her  3. Increased endometrial stripe thickness  She denies bleeding and does not want to have US done at this time  4. Vertigo  - Ambulatory referral to Physical Therapy  5. Pericardial effusion  Denies orthopnea or worsening of SOB no chest pain   6. B12 deficiency  - cyanocobalamin ((VITAMIN B-12)) injection 1,000 mcg; Inject 1 mL (1,000 mcg total) into the muscle once.

## 2016-03-03 DIAGNOSIS — Z5181 Encounter for therapeutic drug level monitoring: Secondary | ICD-10-CM | POA: Diagnosis not present

## 2016-03-03 DIAGNOSIS — I422 Other hypertrophic cardiomyopathy: Secondary | ICD-10-CM | POA: Diagnosis not present

## 2016-03-03 DIAGNOSIS — I340 Nonrheumatic mitral (valve) insufficiency: Secondary | ICD-10-CM | POA: Diagnosis not present

## 2016-03-03 DIAGNOSIS — Z79899 Other long term (current) drug therapy: Secondary | ICD-10-CM | POA: Diagnosis not present

## 2016-03-03 DIAGNOSIS — I480 Paroxysmal atrial fibrillation: Secondary | ICD-10-CM | POA: Diagnosis not present

## 2016-03-04 ENCOUNTER — Ambulatory Visit: Payer: Medicare Other | Attending: Family Medicine | Admitting: Physical Therapy

## 2016-03-04 ENCOUNTER — Encounter: Payer: Self-pay | Admitting: Physical Therapy

## 2016-03-04 DIAGNOSIS — R42 Dizziness and giddiness: Secondary | ICD-10-CM | POA: Insufficient documentation

## 2016-03-04 NOTE — Therapy (Signed)
[place] [facility] MAIN [facility] SERVICES [address] [place], [place], [address] Phone: [phone]   Fax:  [phone]  Physical Therapy Treatment  Patient Details  Name: Georgia Bines MRN: [id number] Date of Birth: Feb 01, 1934 Referring Provider: Dr. Carlynn Purl  Encounter Date: 03/04/2016      PT End of Session - 03/04/16 1046    Visit Number 3   Number of Visits 9   Date for PT Re-Evaluation 04/04/16   PT Start Time 1037   PT Stop Time 1118   PT Time Calculation (min) 41 min   Equipment Utilized During Treatment Gait belt   Activity Tolerance Patient tolerated treatment well   Behavior During Therapy [facility] for tasks assessed/performed      Past Medical History:  Diagnosis Date  . Allergy   . GERD (gastroesophageal reflux disease)   . Hypertension   . Insomnia     Past Surgical History:  Procedure Laterality Date  . BLADDER REPAIR  2014   tact    There were no vitals filed for this visit.      Subjective Assessment - 03/04/16 1043    Subjective Patient states that she went to the [facility] ER due to problems with acid reflux and sinus issues. Patient states the dizziness has gotten better since last being seen for PT on 02/08/2016. Patient denies vertigo but states she feels a "slight wobble".    Patient is accompained by: Family member   Pertinent History Patient reports that she began to have vertigo about 4 weeks ago. Pt states that she went to the [facility] ER after suffering with the vertigo for 2-3 weeks because she stated it continued to get worse instead of better so she went to the ER for care. Patient reports that she recently saw her cardiologist MD in [facility]. Pt states she was referred for vestibular therapy for the vertigo. Patient states she has not seen an ENT in regards to her dizziness symptoms. Patient reports that she is having vertigo, imbalance and unsteadiness. Pt reports the vertigo occurs when she is lying down when she turns  to her right in bed and when she goes to sit up in bed. Pt states that the episodes of vertigo are happening almost daily. Patient reports that the vertigo lasts about a minute but states she feels bad afterwards which can last hours. Patient states that provocative factors include rolling over in bed, sitting up in bed, standing up with her eyes closed, tipping her head back, looking up. Patient reports that moving around and trying to do her normal daily activities helps to relieve her symptoms. Patient's symptoms appear to be motion provoked, positional and intermittent. Patient has started to guard her movements and move more slowly to avoid onset of her symptoms. Pt reports she has had episodes of dizziness in the past about  12-13 years ago. Pt received vestibular therapy and patient states that it helped at the time. Patient states she reports sinus drainage that is constant 24/7 which has been occurring for the past 5 months.    Patient Stated Goals patient would like to be able to roll over and sit up in bed without dizziness and vertigo. Patient would like to be able to turn to the right and to be able to reach up without bringing on symptoms of dizziness and to be able to reach up.   Pain Location Shoulder   Pain Type Chronic pain   Pain Onset More than a month  ago      Neuromuscular Re-education: Per medical record, patient was hospitalized at [facility] on 10/30 for evaluation of post prandial pain and epigastric pain. Patient reports that she has not had any changes to her medical record or medications. Patient appears to be at her same level of function as last observed during PT evaluation and treatment session. PT goals and plan of care remains appropriate.   Performed Left and Right Dix-Hallpike test and both were negative with no nystagmus observed and patient denied vertigo.  Patient with negative head shaking nystagmus test  Left and right head thrust tests  appear to be negative, but difficult to assess due to patient startle reflex.   VOR: Demonstrated and educated as to VOR X1. Patient performed VOR X 1 horiz in sitting 1 rep of 30 seconds and 2 reps of 1 minute reps with vc for technique. Patient denies dizziness with this activity. Issued for HEP.   Airex pad: On firm surface and then on Airex pad, patient performed feet together and semi-tandem progressions with alternating lead leg with and without body turns and horiz and vert head turns. Patient required tactile and verbal cuing for body turns. Patient reports imbalance with these activities and noted mild imbalance with patient using ankle and sometimes hip strategies as well as reaching for support at times. Issued for HEP.       PT Education - 03/04/16 1512    Education provided Yes   Education Details Discussed plan of care; HEP issued   Person(s) Educated Patient;Spouse   Methods Explanation;Demonstration;Handout;Verbal cues   Comprehension Verbalized understanding;Returned demonstration             PT Long Term Goals - 02/01/16 1235      PT LONG TERM GOAL #1   Title Patient will report 50% or greater improvement in her symptoms of dizziness and imbalance with provoking motions or positions by 04/04/2016   Baseline patient reports reaching and looking up and sitting up bring on her dizziness   Time 8   Period Weeks   Status New     PT LONG TERM GOAL #2   Title Patient reports no vertigo with provoking motions or positions by 04/04/2016.   Baseline rolling over in bed and sitting up in bed bring on vertigo per pt report.   Time 8   Period Weeks   Status New     PT LONG TERM GOAL #3   Title Patient will demonstrate reduced falls risk as evidenced by Dynamic Gait Index (DGI) of 21/24 or greater by 04/04/2016.   Baseline scored 18/24   Time 8   Period Weeks   Status New               Plan - 03/04/16 1517    Clinical Impression Statement Per medical  record, patient was hospitalized at [facility] on 10/30 for evaluation of post prandial pain and epigastric pain. Patient reports that she has not had any changes to her medical record or medications. Patient returns to clinic and appears to be at her same level of function as last observed during PT evaluation and treatment session. PT goals and plan of care remains appropriate. Patient with negative bilateral Dix-Hallpike tests this date. Patient with negative head shaking nystagmus and head thrusts tests as well. Patient challenged by balance activties and activities with head turning this date. Patient issued exercises for HEP to address these deficits. Patient would benefit from PT  services to continue to address goals as set on POC, to reduce patient's symptoms of dizziness and imbalance and to decrease her fall risk.     Rehab Potential Good   Clinical Impairments Affecting Rehab Potential positive indicators: motivated, family support, has recieved vestibular therapy in past and reports it was helpful;   negative indicators: potential central signs with vestibular testing in addition to the bilateral + BPPV tests   PT Frequency Other (comment)  1-2 times a week   PT Duration 8 weeks   PT Treatment/Interventions Vestibular;Canalith Repostioning   PT Next Visit Plan Review HEP; progressions of activities with head turns    PT Home Exercise Plan VOR X 1 horiz, semi-tandem and feet together stance with horiz and vert head turns and body turns,    Consulted and Agree with Plan of Care Patient      Patient will benefit from skilled therapeutic intervention in order to improve the following deficits and impairments:  Decreased balance, Dizziness, Difficulty walking  Visit Diagnosis: Dizziness and giddiness     Problem List Patient Active Problem List   Diagnosis Date Noted  . Mesenteric vascular insufficiency (HCC) 02/28/2016  . Pancreatic cyst 02/28/2016  . Increased  endometrial stripe thickness 02/28/2016  . Carotid atherosclerosis, bilateral 01/23/2016  . Hearing loss 08/17/2015  . Mitral regurgitation 04/19/2015  . Pernicious anemia 09/27/2014  . Paroxysmal a-fib (HCC) 09/27/2014  . Benign essential HTN 09/27/2014  . Perennial allergic rhinitis 09/27/2014  . Chronic anemia 09/27/2014  . Insomnia, persistent 09/27/2014  . Hypertensive pulmonary vascular disease 09/27/2014  . B12 deficiency 09/27/2014  . Diverticulosis of colon 09/27/2014  . Dyslipidemia 09/27/2014  . Edema extremities 09/27/2014  . Gastric reflux 09/27/2014  . Bilateral hearing loss 09/27/2014  . Polypharmacy 09/27/2014  . Pelvic pain in female 09/27/2014  . MI (mitral incompetence) 09/27/2014  . Internal hemorrhoids 09/27/2014  . Osteopenia 09/27/2014  . Arthralgia of shoulder 09/27/2014  . Finger tendinitis 09/27/2014  . Urge incontinence 09/27/2014  . Cervical prolapse 09/27/2014  . Vertigo 09/27/2014  . Vitamin D deficiency 09/27/2014  . Bladder cystocele 04/15/2012  . Asymmetric septal hypertrophy (HCC) 05/19/2011  . Hypertrophic cardiomyopathy (HCC) 05/19/2011    Murphy,Dorriea 03/04/2016, 3:29 PM  [facility] [address] , [place], [address] Phone: [phone]   Fax:  [phone]  Name: Beryle FlockZeola Ricard MRN: [id number] Date of Birth: 1934-01-07

## 2016-03-11 ENCOUNTER — Encounter: Payer: Medicare Other | Admitting: Physical Therapy

## 2016-03-11 DIAGNOSIS — K219 Gastro-esophageal reflux disease without esophagitis: Secondary | ICD-10-CM | POA: Diagnosis not present

## 2016-03-12 ENCOUNTER — Other Ambulatory Visit: Payer: Self-pay | Admitting: Family Medicine

## 2016-03-12 DIAGNOSIS — G4700 Insomnia, unspecified: Secondary | ICD-10-CM

## 2016-03-12 NOTE — Telephone Encounter (Signed)
Patient requesting refill of Trazodone to [facility].

## 2016-03-13 ENCOUNTER — Telehealth: Payer: Self-pay | Admitting: Family Medicine

## 2016-03-13 ENCOUNTER — Other Ambulatory Visit: Payer: Self-pay | Admitting: Family Medicine

## 2016-03-13 DIAGNOSIS — J3089 Other allergic rhinitis: Secondary | ICD-10-CM | POA: Diagnosis not present

## 2016-03-13 DIAGNOSIS — R0982 Postnasal drip: Secondary | ICD-10-CM | POA: Diagnosis not present

## 2016-03-13 NOTE — Telephone Encounter (Signed)
PT NEEDS REFILL ON TRAZODONE , PHARM IS [facility] [address]

## 2016-03-14 ENCOUNTER — Encounter: Payer: Medicare Other | Admitting: Physical Therapy

## 2016-03-14 ENCOUNTER — Ambulatory Visit: Payer: Medicare Other | Attending: Family Medicine | Admitting: Physical Therapy

## 2016-03-14 ENCOUNTER — Encounter: Payer: Self-pay | Admitting: Physical Therapy

## 2016-03-14 DIAGNOSIS — R42 Dizziness and giddiness: Secondary | ICD-10-CM | POA: Insufficient documentation

## 2016-03-14 NOTE — Therapy (Signed)
[place] [facility] [address] [place], [place], [address] Phone: [phone]   Fax:  [phone]  Physical Therapy Treatment  Patient Details  Name: Sharon FlockZeola Bullock MRN: [id number] Date of Birth: Feb 24, 1934 Referring Provider: Dr. Carlynn PurlSowles  Encounter Date: 03/14/2016      PT End of Session - 03/14/16 1343    Visit Number 3   Number of Visits 9   Date for PT Re-Evaluation 04/04/16   PT Start Time 0101   PT Stop Time 0140   PT Time Calculation (min) 39 min   Equipment Utilized During Treatment Gait belt   Activity Tolerance Patient tolerated treatment well   Behavior [facility] for tasks assessed/performed      Past Medical History:  Diagnosis Date  . Allergy   . GERD (gastroesophageal reflux disease)   . Hypertension   . Insomnia     Past Surgical History:  Procedure Laterality Date  . BLADDER REPAIR  2014   tact    There were no vitals filed for this visit.      Subjective Assessment - 03/14/16 1304    Subjective Patient states she has not had any more episodes of vertigo. Patient asking about driving. Patient states she has had a "little bit" of unsteadiness but states she has not been sleeping well.    Patient is accompained by: Family member   Pertinent History Patient reports that she began to have vertigo about 4 weeks ago. Pt states that [facility] after suffering with the vertigo for 2-3 weeks because she stated it continued to get worse instead of better so she went to the ER for care. Patient reports that [facility] Pt states she was referred for vestibular therapy for the vertigo. Patient states she has not seen an ENT in regards to her dizziness symptoms. Patient reports that she is having vertigo, imbalance and unsteadiness. Pt reports the vertigo occurs when she is lying down when she turns to her right in bed and when she goes to sit up in bed. Pt  states that the episodes of vertigo are happening almost daily. Patient reports that the vertigo lasts about a minute but states she feels bad afterwards which can last hours. Patient states that provocative factors include rolling over in bed, sitting up in bed, standing up with her eyes closed, tipping her head back, looking up. Patient reports that moving around and trying to do her normal daily activities helps to relieve her symptoms. Patient's symptoms appear to be motion provoked, positional and intermittent. Patient has started to guard her movements and move more slowly to avoid onset of her symptoms. Pt reports she has had episodes of dizziness in the past about  12-13 years ago. Pt received vestibular therapy and patient states that it helped at the time. Patient states she reports sinus drainage that is constant 24/7 which has been occurring for the past 5 months.    Patient Stated Goals patient would like to be able to roll over and sit up in bed without dizziness and vertigo. Patient would like to be able to turn to the right and to be able to reach up without bringing on symptoms of dizziness and to be able to reach up.   Currently in Pain? Yes   Pain Location Shoulder   Pain Orientation Right   Pain Type Chronic pain   Pain Onset More than a  month ago      Neuromuscular Re-education:  VOR: Patient performed VOR X 1 horiz in standing 3 reps of 1 minute reps with verbal cues for technique as initially patient with too much L/R rotation. Patient reports the target is staying in focus and denies any increase in her dizziness levels.   Newman Pies toss to self: Patient performed static standing while tossing ball to self horiz  while tracking ball with head and eyes. Patient denies dizziness with this activity.   Newman Pies toss over shoulder: Patient performed multiple 77' trials of forward ambulation while tossing ball over one shoulder with return catch over opposite shoulder with CGA. Patient  reports increase in dizziness with this activity.  Airex pad: On Airex pad, patient performed feet together progressions and semi-tandem progressions with alternating lead leg with and without body turns and horiz and vert head turns. Patient reports no  dizziness with these activities. Patient demonstrated challenge to her balance with semi-tandem feet position.    Ball circles: Worked on standing with one foot on ball while doing clockwise rolls multiple reps each foot with faded UEs support.  Repeated this exercise while standing on Airex balance pad.   Ball taps: Patient performed alternating foot taps on soccer ball while standing on firm surface and then repeated standing on Airex pad. Patient with loss of balance to her right at times with this activity but improved with practice.  Tandem walking: Patient performed tandem walking 8' times 4 reps. Patient initially stepped off line several times but improved with practice.         PT Education - 03/14/16 1343    Education provided Yes   Education Details Reviewed HEP and added tandem walking   Person(s) Educated Patient;Spouse   Methods Explanation;Demonstration;Handout   Comprehension Verbalized understanding;Returned demonstration;Verbal cues required             PT Long Term Goals - 02/01/16 1235      PT LONG TERM GOAL #1   Title Patient will report 50% or greater improvement in her symptoms of dizziness and imbalance with provoking motions or positions by 04/04/2016   Baseline patient reports reaching and looking up and sitting up bring on her dizziness   Time 8   Period Weeks   Status New     PT LONG TERM GOAL #2   Title Patient reports no vertigo with provoking motions or positions by 04/04/2016.   Baseline rolling over in bed and sitting up in bed bring on vertigo per pt report.   Time 8   Period Weeks   Status New     PT LONG TERM GOAL #3   Title Patient will demonstrate reduced falls risk as evidenced by  Dynamic Gait Index (DGI) of 21/24 or greater by 04/04/2016.   Baseline scored 18/24   Time 8   Period Weeks   Status New               Plan - 03/14/16 1344    Clinical Impression Statement Patient reporting no episodes of vertigo since last visit. Patient does report that she has not been sleeping well and that she has seen her physician in regards to this issue. Patient states she has felt mild unsteadiness this past week but she attributes this to her lack of sleep. Patient states she cannot think of any particular activities that she has been doing at home when she expereinced the unsteadiness. Patient reports compliance with HEP and reviewed this date. Patient  did need cuing on how to perform semo-tandem stance with body turns. Patient denied dizziness throughout session. Patient was challenged by balance activties on Airex pad and tandem walking. Consider retesting functional outcome measures if patient reports no further episodes of vertigo or dizziness at next session and finalizing a HEP focusing on balance activities.    Rehab Potential Good   Clinical Impairments Affecting Rehab Potential positive indicators: motivated, family support, has recieved vestibular therapy in past and reports it was helpful;   negative indicators: potential central signs with vestibular testing in addition to the bilateral + BPPV tests   PT Frequency Other (comment)  1-2 times a week   PT Duration 8 weeks   PT Treatment/Interventions Vestibular;Canalith Repostioning   PT Next Visit Plan If pt reports no further episodes of dizziness consider retesting functional outcome measures and providing final HEP that focuses on balance items.    PT Home Exercise Plan VOR X 1 horiz, semi-tandem and feet together stance with horiz and vert head turns and body turns; tandem walking   Consulted and Agree with Plan of Care Patient      Patient will benefit from skilled therapeutic intervention in order to improve  the following deficits and impairments:  Decreased balance, Dizziness, Difficulty walking  Visit Diagnosis: Dizziness and giddiness     Problem List Patient Active Problem List   Diagnosis Date Noted  . Mesenteric vascular insufficiency (HCC) 02/28/2016  . Pancreatic cyst 02/28/2016  . Increased endometrial stripe thickness 02/28/2016  . Carotid atherosclerosis, bilateral 01/23/2016  . Hearing loss 08/17/2015  . Mitral regurgitation 04/19/2015  . Pernicious anemia 09/27/2014  . Paroxysmal a-fib (HCC) 09/27/2014  . Benign essential HTN 09/27/2014  . Perennial allergic rhinitis 09/27/2014  . Chronic anemia 09/27/2014  . Insomnia, persistent 09/27/2014  . Hypertensive pulmonary vascular disease 09/27/2014  . B12 deficiency 09/27/2014  . Diverticulosis of colon 09/27/2014  . Dyslipidemia 09/27/2014  . Edema extremities 09/27/2014  . Gastric reflux 09/27/2014  . Bilateral hearing loss 09/27/2014  . Polypharmacy 09/27/2014  . Pelvic pain in female 09/27/2014  . MI (mitral incompetence) 09/27/2014  . Internal hemorrhoids 09/27/2014  . Osteopenia 09/27/2014  . Arthralgia of shoulder 09/27/2014  . Finger tendinitis 09/27/2014  . Urge incontinence 09/27/2014  . Cervical prolapse 09/27/2014  . Vertigo 09/27/2014  . Vitamin D deficiency 09/27/2014  . Bladder cystocele 04/15/2012  . Asymmetric septal hypertrophy (HCC) 05/19/2011  . Hypertrophic cardiomyopathy (HCC) 05/19/2011   Mardelle Matte PT, DPT Mardelle Matte 03/14/2016, 1:49 PM  [place] [facility] MAIN [facility] SERVICES [address] [place], [place], [address] Phone: [phone]   Fax:  [phone]  Name: Sharon Bullock MRN: [id number] Date of Birth: Feb 12, 1934

## 2016-03-19 ENCOUNTER — Ambulatory Visit: Payer: Medicare Other

## 2016-03-19 ENCOUNTER — Encounter: Payer: Self-pay | Admitting: Physical Therapy

## 2016-03-19 VITALS — BP 142/66 | HR 61

## 2016-03-19 DIAGNOSIS — R42 Dizziness and giddiness: Secondary | ICD-10-CM | POA: Diagnosis not present

## 2016-03-19 NOTE — Therapy (Signed)
[place] MAIN [facility] SERVICES [address] [place], [place], [address] Phone: [phone]   Fax:  [phone]  Physical Therapy Treatment/Discharge  Patient Details  Name: Sharon Bullock MRN: [id number] Date of Birth: 20-May-1933 Referring Provider: Dr. Ancil Boozer  Encounter Date: 03/19/2016      PT End of Session - 03/19/16 1010    Visit Number 5   Number of Visits 9   Date for PT Re-Evaluation 04/13/16   Authorization Type g codes   PT Start Time 06/29/1003   PT Stop Time 1044   PT Time Calculation (min) 39 min   Equipment Utilized During Treatment Gait belt   Activity Tolerance Patient tolerated treatment well   Behavior During Therapy [facility] for tasks assessed/performed      Past Medical History:  Diagnosis Date  . Allergy   . GERD (gastroesophageal reflux disease)   . Hypertension   . Insomnia     Past Surgical History:  Procedure Laterality Date  . BLADDER REPAIR  28-Jun-2012   tact    Vitals:   03/19/16 1009  BP: (!) 142/66  Pulse: 61  SpO2: 100%        Subjective Assessment - 03/19/16 1008    Subjective Pt reports she is doing wel on this date. She has had no further episodes of vertigo or dizziness. HEP is going well. She is ready for discharge today. No specific questions or concerns.    Patient is accompained by: Family member   Pertinent History Patient reports that she began to have vertigo about 4 weeks ago. Pt states that she went to the [facility] ER after suffering with the vertigo for 2-3 weeks because she stated it continued to get worse instead of better so she went to the ER for care. Patient reports that she recently saw her cardiologist MD in [facility]. Pt states she was referred for vestibular therapy for the vertigo. Patient states she has not seen an ENT in regards to her dizziness symptoms. Patient reports that she is having vertigo, imbalance and unsteadiness. Pt reports the vertigo occurs when she is lying down when she  turns to her right in bed and when she goes to sit up in bed. Pt states that the episodes of vertigo are happening almost daily. Patient reports that the vertigo lasts about a minute but states she feels bad afterwards which can last hours. Patient states that provocative factors include rolling over in bed, sitting up in bed, standing up with her eyes closed, tipping her head back, looking up. Patient reports that moving around and trying to do her normal daily activities helps to relieve her symptoms. Patient's symptoms appear to be motion provoked, positional and intermittent. Patient has started to guard her movements and move more slowly to avoid onset of her symptoms. Pt reports she has had episodes of dizziness in the past about  12-13 years ago. Pt received vestibular therapy and patient states that it helped at the time. Patient states she reports sinus drainage that is constant 24/7 which has been occurring for the past 5 months.    Patient Stated Goals patient would like to be able to roll over and sit up in bed without dizziness and vertigo. Patient would like to be able to turn to the right and to be able to reach up without bringing on symptoms of dizziness and to be able to reach up.   Currently in Pain? Yes   Pain Score 6  Pain Location Shoulder   Pain Orientation Right   Pain Type Chronic pain   Pain Onset More than a month ago            [facility] PT Assessment - 03/19/16 1016      Standardized Balance Assessment   Standardized Balance Assessment Dynamic Gait Index     Dynamic Gait Index   Level Surface Normal   Change in Gait Speed Normal   Gait with Horizontal Head Turns Normal   Gait with Vertical Head Turns Mild Impairment   Gait and Pivot Turn Normal   Step Over Obstacle Normal   Step Around Obstacles Normal   Steps Moderate Impairment   Total Score 21         TREATMENT  Neuromuscular Re-education: Performed DGI with patient; Reviewed and updated all of  patient's goals with her; Perform Dix-Hallpike testing which was negative bilaterally;  Reviewed HEP and performed:  VOR:  Patient performed VOR X 1 horiz in standing 3 reps of 1 minute reps patterned background with verbal cues for technique as initially patient with too much L/R rotation and stops at end range. Patient reports the target is staying in focus and denies any increase in her dizziness levels.    Tandem walking: Patient performed tandem walking 8' times 4 reps. Patient initially stepped off line several times so utilized 2"x4" for additional repetitions;  Modified Tandem Modified tandem stance with horizontal and vertical head turns as well as horizontal head with body turns alternating LE forward x 30 seconds for each side and condition;   Educated regarding HEP and process to return for additional therapy if needed.                   PT Education - 03/19/16 1010    Education provided Yes   Education Details Reviewed HEP and discussed discharge   Person(s) Educated Patient   Methods Explanation   Comprehension Verbalized understanding             PT Long Term Goals - 03/19/16 1011      PT LONG TERM GOAL #1   Title Patient will report 50% or greater improvement in her symptoms of dizziness and imbalance with provoking motions or positions by 04/04/2016   Baseline patient reports reaching and looking up and sitting up bring on her dizziness; 03/19/16: 90% improvement in symptoms   Time 8   Period Weeks   Status Achieved     PT LONG TERM GOAL #2   Title Patient reports no vertigo with provoking motions or positions by 04/04/2016.   Baseline rolling over in bed and sitting up in bed bring on vertigo per pt report. 03/19/16: no further episodes of vertigo   Time 8   Period Weeks   Status Achieved     PT LONG TERM GOAL #3   Title Patient will demonstrate reduced falls risk as evidenced by Dynamic Gait Index (DGI) of 21/24 or greater by 04/04/2016.    Baseline scored 18/24; 03/19/16: 21/24   Time 8   Period Weeks   Status Achieved               Plan - 03/19/16 1011    Clinical Impression Statement Pt has done very well with vestibular therapy. She reports approximatley 90% improvement in her symptoms since starting therapy. She reports no further episodes of vertigo with turning or rolling in bed. Dix-Hallpike testing negative bilaterally on this date. DGI demonstrates improvement from 18/24 at  initial evaluation to 21/24 at discharge. Pt is independent with her home exercise program and has been perform consistently. Pt will be discharged on this date having met all of her goals. Encouraged pt to continue home exercise program and follow-up as needed.    Rehab Potential Good   Clinical Impairments Affecting Rehab Potential positive indicators: motivated, family support, has recieved vestibular therapy in past and reports it was helpful;   negative indicators: potential central signs with vestibular testing in addition to the bilateral + BPPV tests   PT Frequency Other (comment)  1-2 times a week   PT Duration 8 weeks   PT Treatment/Interventions Vestibular;Canalith Repostioning   PT Next Visit Plan Discharge   PT Home Exercise Plan VOR X 1 horiz, semi-tandem and feet together stance with horiz and vert head turns and body turns; tandem walking   Consulted and Agree with Plan of Care Patient      Patient will benefit from skilled therapeutic intervention in order to improve the following deficits and impairments:  Decreased balance, Dizziness, Difficulty walking  Visit Diagnosis: Dizziness and giddiness       G-Codes - 04-03-2016 1254    Functional Assessment Tool Used clinical judgment, DGI   Functional Limitation Mobility: Walking and moving around   Mobility: Walking and Moving Around Goal Status [phone]) At least 1 percent but less than 20 percent impaired, limited or restricted   Mobility: Walking and Moving Around  Discharge Status [phone]) At least 1 percent but less than 20 percent impaired, limited or restricted      Problem List Patient Active Problem List   Diagnosis Date Noted  . Mesenteric vascular insufficiency ([place]) 02/28/2016  . Pancreatic cyst 02/28/2016  . Increased endometrial stripe thickness 02/28/2016  . Carotid atherosclerosis, bilateral 01/23/2016  . Hearing loss 08/17/2015  . Mitral regurgitation 04/19/2015  . Pernicious anemia 09/27/2014  . Paroxysmal a-fib ([place]) 09/27/2014  . Benign essential HTN 09/27/2014  . Perennial allergic rhinitis 09/27/2014  . Chronic anemia 09/27/2014  . Insomnia, persistent 09/27/2014  . Hypertensive pulmonary vascular disease 09/27/2014  . B12 deficiency 09/27/2014  . Diverticulosis of colon 09/27/2014  . Dyslipidemia 09/27/2014  . Edema extremities 09/27/2014  . Gastric reflux 09/27/2014  . Bilateral hearing loss 09/27/2014  . Polypharmacy 09/27/2014  . Pelvic pain in female 09/27/2014  . MI (mitral incompetence) 09/27/2014  . Internal hemorrhoids 09/27/2014  . Osteopenia 09/27/2014  . Arthralgia of shoulder 09/27/2014  . Finger tendinitis 09/27/2014  . Urge incontinence 09/27/2014  . Cervical prolapse 09/27/2014  . Vertigo 09/27/2014  . Vitamin D deficiency 09/27/2014  . Bladder cystocele 04/15/2012  . Asymmetric septal hypertrophy ([place]) 05/19/2011  . Hypertrophic cardiomyopathy ([place]) 05/19/2011   Phillips Grout PT, DPT   Yenny Kosa Apr 03, 2016, 12:57 PM  [place] MAIN [facility] SERVICES [address] [place], [place], [address] Phone: [phone]   Fax:  [phone]  Name: Kenzlee Fishburn MRN: [id number] Date of Birth: Jul 29, 1933

## 2016-03-21 ENCOUNTER — Encounter: Payer: Medicare Other | Admitting: Physical Therapy

## 2016-03-25 ENCOUNTER — Encounter: Payer: Medicare Other | Admitting: Physical Therapy

## 2016-03-26 DIAGNOSIS — Z0182 Encounter for allergy testing: Secondary | ICD-10-CM | POA: Diagnosis not present

## 2016-03-26 DIAGNOSIS — R0982 Postnasal drip: Secondary | ICD-10-CM | POA: Diagnosis not present

## 2016-03-26 DIAGNOSIS — J301 Allergic rhinitis due to pollen: Secondary | ICD-10-CM | POA: Diagnosis not present

## 2016-03-26 DIAGNOSIS — Z91013 Allergy to seafood: Secondary | ICD-10-CM | POA: Diagnosis not present

## 2016-03-26 DIAGNOSIS — J3089 Other allergic rhinitis: Secondary | ICD-10-CM | POA: Diagnosis not present

## 2016-03-28 ENCOUNTER — Encounter: Payer: Medicare Other | Admitting: Physical Therapy

## 2016-04-01 ENCOUNTER — Encounter: Payer: Medicare Other | Admitting: Physical Therapy

## 2016-04-08 ENCOUNTER — Encounter: Payer: Medicare Other | Admitting: Physical Therapy

## 2016-04-10 ENCOUNTER — Other Ambulatory Visit: Payer: Self-pay | Admitting: Family Medicine

## 2016-04-10 DIAGNOSIS — G4700 Insomnia, unspecified: Secondary | ICD-10-CM

## 2016-04-10 NOTE — Telephone Encounter (Signed)
Patient requesting refill of Trazodone to [facility].

## 2016-04-15 ENCOUNTER — Encounter: Payer: Medicare Other | Admitting: Physical Therapy

## 2016-05-10 ENCOUNTER — Other Ambulatory Visit: Payer: Self-pay | Admitting: Family Medicine

## 2016-05-10 DIAGNOSIS — G4700 Insomnia, unspecified: Secondary | ICD-10-CM

## 2016-05-26 ENCOUNTER — Encounter: Payer: Self-pay | Admitting: Family Medicine

## 2016-05-26 ENCOUNTER — Ambulatory Visit
Admission: RE | Admit: 2016-05-26 | Discharge: 2016-05-26 | Disposition: A | Discharge status: Home / Self Care | Payer: Medicare Other | Source: Ambulatory Visit | Attending: Family Medicine | Admitting: Family Medicine

## 2016-05-26 ENCOUNTER — Ambulatory Visit (INDEPENDENT_AMBULATORY_CARE_PROVIDER_SITE_OTHER): Payer: Medicare Other | Admitting: Family Medicine

## 2016-05-26 ENCOUNTER — Other Ambulatory Visit: Payer: Self-pay | Admitting: Family Medicine

## 2016-05-26 VITALS — BP 128/60 | HR 64 | Temp 98.1°F | Resp 16 | Ht 65.0 in | Wt 169.3 lb

## 2016-05-26 DIAGNOSIS — I10 Essential (primary) hypertension: Secondary | ICD-10-CM | POA: Diagnosis not present

## 2016-05-26 DIAGNOSIS — I480 Paroxysmal atrial fibrillation: Secondary | ICD-10-CM | POA: Diagnosis not present

## 2016-05-26 DIAGNOSIS — I517 Cardiomegaly: Secondary | ICD-10-CM

## 2016-05-26 DIAGNOSIS — I6523 Occlusion and stenosis of bilateral carotid arteries: Secondary | ICD-10-CM | POA: Diagnosis not present

## 2016-05-26 DIAGNOSIS — R0989 Other specified symptoms and signs involving the circulatory and respiratory systems: Secondary | ICD-10-CM

## 2016-05-26 DIAGNOSIS — E538 Deficiency of other specified B group vitamins: Secondary | ICD-10-CM | POA: Diagnosis not present

## 2016-05-26 DIAGNOSIS — R0982 Postnasal drip: Secondary | ICD-10-CM | POA: Diagnosis not present

## 2016-05-26 DIAGNOSIS — I5032 Chronic diastolic (congestive) heart failure: Secondary | ICD-10-CM | POA: Insufficient documentation

## 2016-05-26 DIAGNOSIS — I422 Other hypertrophic cardiomyopathy: Secondary | ICD-10-CM

## 2016-05-26 DIAGNOSIS — Z91013 Allergy to seafood: Secondary | ICD-10-CM

## 2016-05-26 DIAGNOSIS — E785 Hyperlipidemia, unspecified: Secondary | ICD-10-CM | POA: Diagnosis not present

## 2016-05-26 DIAGNOSIS — K551 Chronic vascular disorders of intestine: Secondary | ICD-10-CM | POA: Diagnosis not present

## 2016-05-26 DIAGNOSIS — I272 Pulmonary hypertension, unspecified: Secondary | ICD-10-CM | POA: Diagnosis not present

## 2016-05-26 DIAGNOSIS — G4700 Insomnia, unspecified: Secondary | ICD-10-CM

## 2016-05-26 DIAGNOSIS — I509 Heart failure, unspecified: Secondary | ICD-10-CM | POA: Diagnosis not present

## 2016-05-26 DIAGNOSIS — E559 Vitamin D deficiency, unspecified: Secondary | ICD-10-CM | POA: Diagnosis not present

## 2016-05-26 DIAGNOSIS — I501 Left ventricular failure: Secondary | ICD-10-CM | POA: Diagnosis not present

## 2016-05-26 LAB — COMPLETE METABOLIC PANEL WITH GFR
ALBUMIN: 3.7 g/dL (ref 3.6–5.1)
ALK PHOS: 61 U/L (ref 33–130)
ALT: 11 U/L (ref 6–29)
AST: 18 U/L (ref 10–35)
BILIRUBIN TOTAL: 0.4 mg/dL (ref 0.2–1.2)
BUN: 15 mg/dL (ref 7–25)
CO2: 27 mmol/L (ref 20–31)
CREATININE: 0.87 mg/dL (ref 0.60–0.88)
Calcium: 9 mg/dL (ref 8.6–10.4)
Chloride: 108 mmol/L (ref 98–110)
GFR, EST AFRICAN AMERICAN: 73 mL/min (ref >=60)
GFR, EST NON AFRICAN AMERICAN: 63 mL/min (ref >=60)
GLUCOSE: 95 mg/dL (ref 65–99)
Potassium: 3.8 mmol/L (ref 3.5–5.3)
SODIUM: 139 mmol/L (ref 135–146)
TOTAL PROTEIN: 6.6 g/dL (ref 6.1–8.1)

## 2016-05-26 LAB — CBC WITH DIFFERENTIAL/PLATELET
Basophils Absolute: 43 cells/uL (ref 0–200)
Basophils Relative: 1 %
EOS ABS: 43 {cells}/uL (ref 15–500)
EOS PCT: 1 %
HCT: 33.5 % — ABNORMAL LOW (ref 35.0–45.0)
Hemoglobin: 10.7 g/dL — ABNORMAL LOW (ref 11.7–15.5)
LYMPHS PCT: 30 %
Lymphs Abs: 1290 cells/uL (ref 850–3900)
MCH: 30.3 pg (ref 27.0–33.0)
MCHC: 31.9 g/dL — AB (ref 32.0–36.0)
MCV: 94.9 fL (ref 80.0–100.0)
MONOS PCT: 9 %
MPV: 11.1 fL (ref 7.5–12.5)
Monocytes Absolute: 387 cells/uL (ref 200–950)
Neutro Abs: 2537 cells/uL (ref 1500–7800)
Neutrophils Relative %: 59 %
PLATELETS: 139 10*3/uL — AB (ref 140–400)
RBC: 3.53 MIL/uL — ABNORMAL LOW (ref 3.80–5.10)
RDW: 13.7 % (ref 11.0–15.0)
WBC: 4.3 10*3/uL (ref 3.8–10.8)

## 2016-05-26 LAB — LIPID PANEL
Cholesterol: 164 mg/dL (ref <200)
HDL: 84 mg/dL (ref >50)
LDL Cholesterol: 67 mg/dL (ref <100)
TRIGLYCERIDES: 66 mg/dL (ref <150)
Total CHOL/HDL Ratio: 2 Ratio (ref <5.0)
VLDL: 13 mg/dL (ref <30)

## 2016-05-26 LAB — BRAIN NATRIURETIC PEPTIDE: BRAIN NATRIURETIC PEPTIDE: 309.9 pg/mL — AB (ref <100)

## 2016-05-26 LAB — VITAMIN B12: VITAMIN B 12: 642 pg/mL (ref 200–1100)

## 2016-05-26 MED ORDER — AMLODIPINE BESYLATE 10 MG PO TABS: 10.0000 mg | ORAL_TABLET | Freq: Every day | ORAL | 1 refills | Status: DC

## 2016-05-26 MED ORDER — IRBESARTAN 300 MG PO TABS: ORAL_TABLET | ORAL | 1 refills | Status: DC

## 2016-05-26 MED ORDER — CYANOCOBALAMIN 1000 MCG/ML IJ SOLN
1000.0000 ug | Freq: Once | INTRAMUSCULAR | Status: AC
  Administered 2016-05-26: 1000 ug via INTRAMUSCULAR

## 2016-05-26 MED ORDER — EPINEPHRINE 0.3 MG/0.3ML IJ SOAJ: 0.3000 mg | Freq: Once | INTRAMUSCULAR | 0 refills | Status: AC

## 2016-05-26 MED ORDER — FUROSEMIDE 20 MG PO TABS: 20.0000 mg | ORAL_TABLET | ORAL | 0 refills | Status: DC

## 2016-05-26 MED ORDER — AZELASTINE HCL 0.1 % NA SOLN: 2.0000 | Freq: Two times a day (BID) | NASAL | 12 refills | Status: DC

## 2016-05-26 MED ORDER — CYANOCOBALAMIN 1000 MCG/ML IJ SOLN: 1000.0000 ug | Freq: Once | INTRAMUSCULAR | 0 refills | Status: AC

## 2016-05-26 MED ORDER — ROSUVASTATIN CALCIUM 10 MG PO TABS: 5.0000 mg | ORAL_TABLET | Freq: Every day | ORAL | 1 refills | Status: DC

## 2016-05-26 MED ORDER — POTASSIUM CHLORIDE CRYS ER 20 MEQ PO TBCR: 20.0000 meq | EXTENDED_RELEASE_TABLET | ORAL | 0 refills | Status: DC

## 2016-05-26 MED ORDER — ZOLPIDEM TARTRATE 5 MG PO TABS: 5.0000 mg | ORAL_TABLET | Freq: Every evening | ORAL | 2 refills | Status: DC | PRN

## 2016-05-26 MED ORDER — CARVEDILOL 25 MG PO TABS: 25.0000 mg | ORAL_TABLET | Freq: Two times a day (BID) | ORAL | 1 refills | Status: DC

## 2016-05-26 NOTE — Progress Notes (Signed)
Name: Sharon Bullock   MRN: [id number]    DOB: [birth year]   Date:05/26/2016       Progress Note  Subjective  Chief Complaint  Chief Complaint  Patient presents with  . Insomnia    4 month follow up  . Hypertension  . Medication Refill  . Anemia  . Atrial Fibrillation  . b12 deficiency    HPI  HTN: bp is at goal today, no orthostatic changes, chest pain, SOB  or palpitation.   Carotid atherosclerosis: found during MRI head done at [facility] for evaluation of vertigo in 12/2015.  On medical management at this time. She also has mesenteric insufficiency, she denies abdominal pain   Vertigo:no recent episodes of vertigo, had PT and symptoms have improved  Perennial allergic rhinitis: she has post-nasal drainage, symptoms started 27 years ago when she moved to [place], but used to be intermittent but now is daily. She states at times she has a hoarseness. No facial pain or headache, no fever. Post-nasal drainage, chest congestion, currently seeing pulmonologist and allergist at [facility]. Started on Astelin but still has symptoms. She has follow up tomorrow.   B12 deficiency with a previous history of pernicious anemia: she is receiving monthly B12 shots and has been doing well  Afib: she sees a cardiologist - Dr. Regino Schultze at [facility]. She has been on Pradaxa, she denies palpitation, no edema or SOB  Hypertrophic cardiomyopathy and hypertensive pulmonary vascular disease: she sees Dr. Regino Schultze and is doing well, no chest pain, no SOB,  no paroxysmal nocturnal dyspnea, she has bilateral ankle edema at the end of the day .    Insomnia: She tried Trazodone without help, only able to sleep one hour per night, she will try resuming Ambien at lower dose of 5 mg  Dyslipidemia: she states that over the past month she has noticed generalized body aches, worse at night, she has been taking Atorvastatin and we will try switching to Crestor and add CoQ10   Patient Active Problem List   Diagnosis Date Noted  .  Mesenteric vascular insufficiency (HCC) 02/28/2016  . Pancreatic cyst 02/28/2016  . Increased endometrial stripe thickness 02/28/2016  . Carotid atherosclerosis, bilateral 01/23/2016  . Hearing loss 08/17/2015  . Mitral regurgitation 04/19/2015  . Pernicious anemia 09/27/2014  . Paroxysmal a-fib (HCC) 09/27/2014  . Benign essential HTN 09/27/2014  . Perennial allergic rhinitis 09/27/2014  . Chronic anemia 09/27/2014  . Insomnia, persistent 09/27/2014  . Hypertensive pulmonary vascular disease 09/27/2014  . B12 deficiency 09/27/2014  . Diverticulosis of colon 09/27/2014  . Dyslipidemia 09/27/2014  . Edema extremities 09/27/2014  . Gastric reflux 09/27/2014  . Bilateral hearing loss 09/27/2014  . Polypharmacy 09/27/2014  . Pelvic pain in female 09/27/2014  . MI (mitral incompetence) 09/27/2014  . Internal hemorrhoids 09/27/2014  . Osteopenia 09/27/2014  . Arthralgia of shoulder 09/27/2014  . Finger tendinitis 09/27/2014  . Urge incontinence 09/27/2014  . Cervical prolapse 09/27/2014  . Vertigo 09/27/2014  . Vitamin D deficiency 09/27/2014  . Bladder cystocele 04/15/2012  . Asymmetric septal hypertrophy (HCC) 05/19/2011  . Hypertrophic cardiomyopathy (HCC) 05/19/2011    Past Surgical History:  Procedure Laterality Date  . BLADDER REPAIR  2014   tact    Family History  Problem Relation Age of Onset  . Alzheimer's disease Mother   . Hypertension Mother   . Stroke Father   . Heart disease Father   . Heart disease Brother   . Hypertension Brother   . Diabetes Brother   .  Cancer Daughter     breast  . Hypertension Daughter     Social History   Social History  . Marital status: Married    Spouse name: N/A  . Number of children: N/A  . Years of education: N/A   Occupational History  . Not on file.   Social History Main Topics  . Smoking status: Never Smoker  . Smokeless tobacco: Never Used  . Alcohol use No  . Drug use: No  . Sexual activity: Not Currently    Other Topics Concern  . Not on file   Social History Narrative  . No narrative on file     Current Outpatient Prescriptions:  .  amiodarone (PACERONE) 200 MG tablet, Take 1 tablet by mouth daily., Disp: , Rfl:  .  amLODipine (NORVASC) 10 MG tablet, Take 1 tablet (10 mg total) by mouth daily., Disp: 90 tablet, Rfl: 1 .  carvedilol (COREG) 25 MG tablet, Take 1 tablet (25 mg total) by mouth 2 (two) times daily., Disp: 180 tablet, Rfl: 1 .  diclofenac sodium (VOLTAREN) 1 % GEL, APPLY TO RIGHT HAND AND FINGER TWICE DAILY, Disp: 100 g, Rfl: 0 .  fluticasone (FLONASE) 50 MCG/ACT nasal spray, Place 2 sprays into both nostrils daily., Disp: 16 g, Rfl: 5 .  folic acid (FOLVITE) 400 MCG tablet, Take 1 tablet by mouth daily. Reported on 04/19/2015, Disp: , Rfl:  .  irbesartan (AVAPRO) 300 MG tablet, TAKE ONE TABLET BY MOUTH EVERY DAY FOR FOR BLOOD PRESSURE, Disp: 90 tablet, Rfl: 1 .  loratadine (CLARITIN) 10 MG tablet, Take 1 tablet by mouth as needed. Reported on 04/19/2015, Disp: , Rfl:  .  PRADAXA 150 MG CAPS capsule, Take 1 tablet by mouth 2 (two) times daily., Disp: , Rfl:  .  ranitidine (ZANTAC) 150 MG tablet, TAKE 1 TABLET TWICE DAILY IN PLACE OF OMEPRAZOLE FOR GERD, Disp: 180 tablet, Rfl: 0 .  rosuvastatin (CRESTOR) 10 MG tablet, Take 0.5-1 tablets (5-10 mg total) by mouth daily. With Co-Q10, Disp: 90 tablet, Rfl: 1 .  Vitamin D, Cholecalciferol, 1000 UNITS TABS, Take 1 tablet by mouth daily., Disp: , Rfl:  .  zolpidem (AMBIEN) 5 MG tablet, Take 1 tablet (5 mg total) by mouth at bedtime as needed for sleep., Disp: 30 tablet, Rfl: 2  Allergies  Allergen Reactions  . Nickel Dermatitis and Swelling  . Other Anaphylaxis    Uncoded Allergy. Allergen: SHELLFISH  . Ace Inhibitors Cough     ROS  Constitutional: Negative for fever , positive for  weight change.  Respiratory: Positive for cough and chest congestion but no shortness of breath.   Cardiovascular: Negative for chest pain or  palpitations.  Gastrointestinal: Negative for abdominal pain, no bowel changes.  Musculoskeletal: Negative for gait problem or joint swelling.  Skin: Negative for rash.  Neurological: Negative for dizziness or headache.  No other specific complaints in a complete review of systems (except as listed in HPI above).  Objective  Vitals:   05/26/16 0837  BP: 128/60  Pulse: 64  Resp: 16  Temp: 98.1 F (36.7 C)  SpO2: 95%  Weight: 169 lb 5 oz (76.8 kg)  Height: 5\' 5"  (1.651 m)    Body mass index is 28.18 kg/m.  Physical Exam  Constitutional: Patient appears well-developed and well-nourished. Obese  No distress.  HEENT: head atraumatic, normocephalic, pupils equal and reactive to light,  neck supple, throat within normal limits Cardiovascular: Normal rate, regular rhythm and normal heart sounds. 3/6 SEM.  No BLE edema. Pulmonary/Chest: Effort normal, crackles on basis . No respiratory distress. Abdominal: Soft.  There is no tenderness. Psychiatric: Patient has a normal mood and affect. behavior is normal. Judgment and thought content normal. Muscular Skeletal: negative trigger points  PHQ2/9: Depression screen [facility] 2/9 05/26/2016 01/23/2016 08/17/2015 04/19/2015 01/03/2015  Decreased Interest 0 0 0 0 0  Down, Depressed, Hopeless 0 0 0 0 0  PHQ - 2 Score 0 0 0 0 0     Fall Risk: Fall Risk  05/26/2016 08/17/2015 04/19/2015 01/03/2015 09/28/2014  Falls in the past year? No No No Yes Yes  Number falls in past yr: - - - 1 1  Injury with Fall? - - - No No     Functional Status Survey: Is the patient deaf or have difficulty hearing?: Yes Does the patient have difficulty seeing, even when wearing glasses/contacts?: No Does the patient have difficulty concentrating, remembering, or making decisions?: No Does the patient have difficulty walking or climbing stairs?: Yes Does the patient have difficulty dressing or bathing?: No Does the patient have difficulty doing errands alone such as  visiting a doctor's office or shopping?: No    Assessment & Plan  1. Mesenteric vascular insufficiency (HCC)  On statin therapy and having muscle pain, we will change to Crestor and add CoQ10 to see if she feels better, continue Pradaxa  2. Carotid atherosclerosis, bilateral  She is on Pradaxa  3. Paroxysmal a-fib (HCC)  Rate controlled at this time  4. Insomnia, persistent  She did not respond to Trazodone, she states she will pay for Ambien, but advised to stay on lower dose of 5 mg per FDA  - zolpidem (AMBIEN) 5 MG tablet; Take 1 tablet (5 mg total) by mouth at bedtime as needed for sleep.  Dispense: 30 tablet; Refill: 2  5. Benign essential HTN  - irbesartan (AVAPRO) 300 MG tablet; TAKE ONE TABLET BY MOUTH EVERY DAY FOR FOR BLOOD PRESSURE  Dispense: 90 tablet; Refill: 1 - carvedilol (COREG) 25 MG tablet; Take 1 tablet (25 mg total) by mouth 2 (two) times daily.  Dispense: 180 tablet; Refill: 1 - amLODipine (NORVASC) 10 MG tablet; Take 1 tablet (10 mg total) by mouth daily.  Dispense: 90 tablet; Refill: 1 - COMPLETE METABOLIC PANEL WITH GFR - CBC with Differential/Platelet  6. Dyslipidemia  - rosuvastatin (CRESTOR) 10 MG tablet; Take 0.5-1 tablets (5-10 mg total) by mouth daily. With Co-Q10  Dispense: 90 tablet; Refill: 1 - Lipid panel  7. Hypertensive pulmonary vascular disease  - Brain natriuretic peptide  8. Hypertrophic cardiomyopathy (HCC)  - Brain natriuretic peptide  9. Asymmetric septal hypertrophy (HCC)  - Brain natriuretic peptide  10. Bibasilar crackles  She has seen ENT and pulmonologist at [facility] for evaluation of post-nasal drainage, continues to have chest congestion and a cough, we will check labs and CXR. She has follow up tomorrow at [facility] - Brain natriuretic peptide - CBC with Differential/Platelet -CXR   11. B12 deficiency  - Vitamin B12  12. Vitamin D deficiency  - VITAMIN D 25 Hydroxy (Vit-D Deficiency, Fractures)  13. Post-nasal  drip  - azelastine (ASTELIN) 0.1 % nasal spray; Place 2 sprays into both nostrils 2 (two) times daily. Use in each nostril as directed  Dispense: 30 mL; Refill: 12  14. Allergy to shellfish  - EPINEPHrine (EPIPEN 2-PAK) 0.3 mg/0.3 mL IJ SOAJ injection; Inject 0.3 mLs (0.3 mg total) into the muscle once.  Dispense: 1 Device; Refill: 0

## 2016-05-27 ENCOUNTER — Other Ambulatory Visit: Payer: Self-pay | Admitting: Family Medicine

## 2016-05-27 DIAGNOSIS — D696 Thrombocytopenia, unspecified: Secondary | ICD-10-CM

## 2016-05-27 DIAGNOSIS — K219 Gastro-esophageal reflux disease without esophagitis: Secondary | ICD-10-CM | POA: Diagnosis not present

## 2016-05-27 LAB — VITAMIN D 25 HYDROXY (VIT D DEFICIENCY, FRACTURES): VIT D 25 HYDROXY: 37 ng/mL (ref 30–100)

## 2016-06-16 ENCOUNTER — Ambulatory Visit: Payer: Medicare Other | Admitting: Hematology and Oncology

## 2016-06-19 ENCOUNTER — Inpatient Hospital Stay: Payer: Medicare Other | Attending: Hematology and Oncology | Admitting: Oncology

## 2016-06-19 ENCOUNTER — Encounter: Payer: Self-pay | Admitting: Oncology

## 2016-06-19 ENCOUNTER — Inpatient Hospital Stay: Payer: Medicare Other

## 2016-06-19 VITALS — BP 153/83 | HR 52 | Temp 98.5°F | Resp 18 | Ht 62.99 in | Wt 166.1 lb

## 2016-06-19 DIAGNOSIS — E785 Hyperlipidemia, unspecified: Secondary | ICD-10-CM

## 2016-06-19 DIAGNOSIS — I480 Paroxysmal atrial fibrillation: Secondary | ICD-10-CM

## 2016-06-19 DIAGNOSIS — I272 Pulmonary hypertension, unspecified: Secondary | ICD-10-CM | POA: Diagnosis not present

## 2016-06-19 DIAGNOSIS — M8580 Other specified disorders of bone density and structure, unspecified site: Secondary | ICD-10-CM | POA: Diagnosis not present

## 2016-06-19 DIAGNOSIS — I422 Other hypertrophic cardiomyopathy: Secondary | ICD-10-CM

## 2016-06-19 DIAGNOSIS — K5730 Diverticulosis of large intestine without perforation or abscess without bleeding: Secondary | ICD-10-CM | POA: Diagnosis not present

## 2016-06-19 DIAGNOSIS — Z7901 Long term (current) use of anticoagulants: Secondary | ICD-10-CM | POA: Diagnosis not present

## 2016-06-19 DIAGNOSIS — G4700 Insomnia, unspecified: Secondary | ICD-10-CM | POA: Insufficient documentation

## 2016-06-19 DIAGNOSIS — R5382 Chronic fatigue, unspecified: Secondary | ICD-10-CM

## 2016-06-19 DIAGNOSIS — K219 Gastro-esophageal reflux disease without esophagitis: Secondary | ICD-10-CM | POA: Diagnosis not present

## 2016-06-19 DIAGNOSIS — I110 Hypertensive heart disease with heart failure: Secondary | ICD-10-CM | POA: Diagnosis not present

## 2016-06-19 DIAGNOSIS — Z79899 Other long term (current) drug therapy: Secondary | ICD-10-CM | POA: Diagnosis not present

## 2016-06-19 DIAGNOSIS — D649 Anemia, unspecified: Secondary | ICD-10-CM

## 2016-06-19 DIAGNOSIS — I340 Nonrheumatic mitral (valve) insufficiency: Secondary | ICD-10-CM | POA: Diagnosis not present

## 2016-06-19 DIAGNOSIS — D696 Thrombocytopenia, unspecified: Secondary | ICD-10-CM | POA: Diagnosis not present

## 2016-06-19 DIAGNOSIS — I10 Essential (primary) hypertension: Secondary | ICD-10-CM | POA: Diagnosis not present

## 2016-06-19 DIAGNOSIS — Z803 Family history of malignant neoplasm of breast: Secondary | ICD-10-CM

## 2016-06-19 DIAGNOSIS — J309 Allergic rhinitis, unspecified: Secondary | ICD-10-CM | POA: Diagnosis not present

## 2016-06-19 DIAGNOSIS — I6529 Occlusion and stenosis of unspecified carotid artery: Secondary | ICD-10-CM

## 2016-06-19 DIAGNOSIS — R5381 Other malaise: Secondary | ICD-10-CM | POA: Diagnosis not present

## 2016-06-19 DIAGNOSIS — E538 Deficiency of other specified B group vitamins: Secondary | ICD-10-CM

## 2016-06-19 LAB — CBC WITH DIFFERENTIAL/PLATELET
BASOS ABS: 0 10*3/uL (ref 0–0.1)
BASOS PCT: 1 %
EOS ABS: 0.1 10*3/uL (ref 0–0.7)
EOS PCT: 1 %
HCT: 33.1 % — ABNORMAL LOW (ref 35.0–47.0)
Hemoglobin: 11.1 g/dL — ABNORMAL LOW (ref 12.0–16.0)
Lymphocytes Relative: 27 %
Lymphs Abs: 1.3 10*3/uL (ref 1.0–3.6)
MCH: 30.8 pg (ref 26.0–34.0)
MCHC: 33.5 g/dL (ref 32.0–36.0)
MCV: 92 fL (ref 80.0–100.0)
MONO ABS: 0.4 10*3/uL (ref 0.2–0.9)
Monocytes Relative: 9 %
Neutro Abs: 2.9 10*3/uL (ref 1.4–6.5)
Neutrophils Relative %: 62 %
PLATELETS: 128 10*3/uL — AB (ref 150–440)
RBC: 3.6 MIL/uL — AB (ref 3.80–5.20)
RDW: 13.7 % (ref 11.5–14.5)
WBC: 4.7 10*3/uL (ref 3.6–11.0)

## 2016-06-19 LAB — IRON AND TIBC
IRON: 58 ug/dL (ref 28–170)
SATURATION RATIOS: 22 % (ref 10.4–31.8)
TIBC: 267 ug/dL (ref 250–450)
UIBC: 209 ug/dL

## 2016-06-19 LAB — COMPREHENSIVE METABOLIC PANEL
ALBUMIN: 4.1 g/dL (ref 3.5–5.0)
ALT: 15 U/L (ref 14–54)
ANION GAP: 6 (ref 5–15)
AST: 25 U/L (ref 15–41)
Alkaline Phosphatase: 64 U/L (ref 38–126)
BILIRUBIN TOTAL: 0.5 mg/dL (ref 0.3–1.2)
BUN: 18 mg/dL (ref 6–20)
CHLORIDE: 104 mmol/L (ref 101–111)
CO2: 27 mmol/L (ref 22–32)
Calcium: 9.2 mg/dL (ref 8.9–10.3)
Creatinine, Ser: 0.91 mg/dL (ref 0.44–1.00)
GFR calc Af Amer: >60 mL/min (ref >60)
GFR calc non Af Amer: 58 mL/min — ABNORMAL LOW (ref >60)
Glucose, Bld: 96 mg/dL (ref 65–99)
POTASSIUM: 3.6 mmol/L (ref 3.5–5.1)
Sodium: 137 mmol/L (ref 135–145)
Total Protein: 7.1 g/dL (ref 6.5–8.1)

## 2016-06-19 LAB — RETICULOCYTES
RBC.: 3.68 MIL/uL — ABNORMAL LOW (ref 3.80–5.20)
Retic Count, Absolute: 40.5 10*3/uL (ref 19.0–183.0)
Retic Ct Pct: 1.1 % (ref 0.4–3.1)

## 2016-06-19 LAB — FOLATE: Folate: 32 ng/mL (ref >5.9)

## 2016-06-19 LAB — TSH: TSH: 0.912 u[IU]/mL (ref 0.350–4.500)

## 2016-06-19 LAB — PATHOLOGIST SMEAR REVIEW

## 2016-06-19 LAB — FERRITIN: FERRITIN: 58 ng/mL (ref 11–307)

## 2016-06-19 NOTE — Progress Notes (Signed)
Here as a new pt. States Dr Carlynn Purl referred her due to abnormal labs. Was recntly told she has CHF- pt denies sx-stated going to cardiologist in June @ [facility]

## 2016-06-19 NOTE — Progress Notes (Signed)
Hematology/Oncology Consult note [facility] Telephone:([phone] [phone]  Patient Care Team: Steele Sizer, MD as PCP - General (Family Medicine)   Name of the patient: Sharon Bullock  [id number]  Oct 04, 1933    Reason for referral- thrombocytopenia   Referring physician- Dr. Ancil Boozer  Date of visit: 06/19/16   History of presenting illness- patient is a 83 year old female with a past medical history significant for hypertension, allergic rhinitis B12 deficiency as well as hypertrophic cardiomyopathy and pulmonary vascular disease. Patient had a recent CBC which showed white count of 4.3, H&H of 10.7/33.5 and a platelet count of 139. She has been sent to [place] for evaluation and management of thrombocytopenia. Prior CBC from May 2017 showed white count of 5, H&H of 11.4/74.9 and a platelet count of 177. Looking at her prior counts from 2007 2017, a she has had normal platelet count of 04/15/2012. Between October and November 2017 patient's platelet count was 128, 116, 141 respectively. Her H&H during this time has been trending between 10.3-11.  Patient reports chronic fatigue. Says her appetite is fair. No unintentional weight loss  ECOG PS- 1  Pain scale- 0   Review of systems- Review of Systems  Constitutional: Positive for malaise/fatigue. Negative for chills, fever and weight loss.  HENT: Negative for congestion, ear discharge and nosebleeds.   Eyes: Negative for blurred vision.  Respiratory: Negative for cough, hemoptysis, sputum production, shortness of breath and wheezing.   Cardiovascular: Negative for chest pain, palpitations, orthopnea and claudication.  Gastrointestinal: Negative for abdominal pain, blood in stool, constipation, diarrhea, heartburn, melena, nausea and vomiting.  Genitourinary: Negative for dysuria, flank pain, frequency, hematuria and urgency.  Musculoskeletal: Negative for back pain, joint pain and myalgias.  Skin:  Negative for rash.  Neurological: Positive for weakness. Negative for dizziness, tingling, focal weakness, seizures and headaches.  Endo/Heme/Allergies: Does not bruise/bleed easily.  Psychiatric/Behavioral: Negative for depression and suicidal ideas. The patient does not have insomnia.     Allergies  Allergen Reactions  . Nickel Dermatitis and Swelling  . Other Anaphylaxis    Uncoded Allergy. Allergen: SHELLFISH  . Ace Inhibitors Cough    Patient Active Problem List   Diagnosis Date Noted  . Thrombocytopenia ([place]) 05/27/2016  . CHF (congestive heart failure) ([place]) 05/26/2016  . Mesenteric vascular insufficiency ([place]) 02/28/2016  . Pancreatic cyst 02/28/2016  . Increased endometrial stripe thickness 02/28/2016  . Carotid atherosclerosis, bilateral 01/23/2016  . Hearing loss 08/17/2015  . Mitral regurgitation 04/19/2015  . Pernicious anemia 09/27/2014  . Paroxysmal a-fib ([place]) 09/27/2014  . Benign essential HTN 09/27/2014  . Perennial allergic rhinitis 09/27/2014  . Chronic anemia 09/27/2014  . Insomnia, persistent 09/27/2014  . Hypertensive pulmonary vascular disease 09/27/2014  . B12 deficiency 09/27/2014  . Diverticulosis of colon 09/27/2014  . Dyslipidemia 09/27/2014  . Edema extremities 09/27/2014  . Gastric reflux 09/27/2014  . Bilateral hearing loss 09/27/2014  . Polypharmacy 09/27/2014  . Pelvic pain in female 09/27/2014  . MI (mitral incompetence) 09/27/2014  . Internal hemorrhoids 09/27/2014  . Osteopenia 09/27/2014  . Arthralgia of shoulder 09/27/2014  . Finger tendinitis 09/27/2014  . Urge incontinence 09/27/2014  . Cervical prolapse 09/27/2014  . Vertigo 09/27/2014  . Vitamin D deficiency 09/27/2014  . Bladder cystocele 04/15/2012  . Asymmetric septal hypertrophy ([place]) 05/19/2011  . Hypertrophic cardiomyopathy ([place]) 05/19/2011     Past Medical History:  Diagnosis Date  . Allergy   . Enlarged heart 2000  . GERD (gastroesophageal reflux disease)     .  Hypertension   . Insomnia   . Vertigo 2006     Past Surgical History:  Procedure Laterality Date  . BLADDER REPAIR  2014   tact    Social History   Social History  . Marital status: Married    Spouse name: N/A  . Number of children: N/A  . Years of education: N/A   Occupational History  . Not on file.   Social History Main Topics  . Smoking status: Never Smoker  . Smokeless tobacco: Never Used  . Alcohol use No  . Drug use: No  . Sexual activity: Not Currently   Other Topics Concern  . Not on file   Social History Narrative  . No narrative on file     Family History  Problem Relation Age of Onset  . Alzheimer's disease Mother   . Hypertension Mother   . Stroke Father   . Heart disease Father   . Heart disease Brother   . Hypertension Brother   . Diabetes Brother   . Cancer Daughter     breast  . Hypertension Daughter   . Breast cancer Daughter      Current Outpatient Prescriptions:  .  amiodarone (PACERONE) 200 MG tablet, Take 1 tablet by mouth daily., Disp: , Rfl:  .  amLODipine (NORVASC) 10 MG tablet, Take 1 tablet (10 mg total) by mouth daily., Disp: 90 tablet, Rfl: 1 .  azelastine (ASTELIN) 0.1 % nasal spray, Place 2 sprays into both nostrils 2 (two) times daily. Use in each nostril as directed, Disp: 30 mL, Rfl: 12 .  carvedilol (COREG) 25 MG tablet, Take 1 tablet (25 mg total) by mouth 2 (two) times daily., Disp: 180 tablet, Rfl: 1 .  cyanocobalamin 1000 MCG tablet, 1,000 mcg daily., Disp: , Rfl:  .  diclofenac sodium (VOLTAREN) 1 % GEL, APPLY TO RIGHT HAND AND FINGER TWICE DAILY, Disp: 100 g, Rfl: 0 .  fluticasone (FLONASE) 50 MCG/ACT nasal spray, Place 2 sprays into both nostrils daily., Disp: 16 g, Rfl: 5 .  folic acid (FOLVITE) 833 MCG tablet, Take 1 tablet by mouth daily. Reported on 04/19/2015, Disp: , Rfl:  .  furosemide (LASIX) 20 MG tablet, Take 1 tablet (20 mg total) by mouth every morning., Disp: 7 tablet, Rfl: 0 .  irbesartan (AVAPRO)  300 MG tablet, TAKE ONE TABLET BY MOUTH EVERY DAY FOR FOR BLOOD PRESSURE, Disp: 90 tablet, Rfl: 1 .  potassium chloride SA (K-DUR,KLOR-CON) 20 MEQ tablet, Take 1 tablet (20 mEq total) by mouth every morning., Disp: 7 tablet, Rfl: 0 .  PRADAXA 150 MG CAPS capsule, Take 1 tablet by mouth 2 (two) times daily., Disp: , Rfl:  .  ranitidine (ZANTAC) 150 MG tablet, TAKE 1 TABLET TWICE DAILY IN PLACE OF OMEPRAZOLE FOR GERD, Disp: 180 tablet, Rfl: 0 .  rosuvastatin (CRESTOR) 10 MG tablet, Take 0.5-1 tablets (5-10 mg total) by mouth daily. With Co-Q10, Disp: 90 tablet, Rfl: 1 .  Vitamin D, Cholecalciferol, 1000 UNITS TABS, Take 1 tablet by mouth daily., Disp: , Rfl:  .  zolpidem (AMBIEN) 5 MG tablet, Take 1 tablet (5 mg total) by mouth at bedtime as needed for sleep., Disp: 30 tablet, Rfl: 2 .  loratadine (CLARITIN) 10 MG tablet, Take 1 tablet by mouth as needed. Reported on 04/19/2015, Disp: , Rfl:    Physical exam:  Vitals:   06/19/16 1049  BP: (!) 153/83  Pulse: (!) 52  Resp: 18  Temp: 98.5 F (36.9 C)  TempSrc:  Tympanic  Weight: 166 lb 1.9 oz (75.4 kg)  Height: 5' 2.99" (1.6 m)   Physical Exam  Constitutional: She is oriented to person, place, and time and well-developed, well-nourished, and in no distress.  HENT:  Head: Normocephalic and atraumatic.  Eyes: EOM are normal. Pupils are equal, round, and reactive to light.  Neck: Normal range of motion.  Cardiovascular: Normal rate and regular rhythm.   Murmur (systolic murmur +) heard. Pulmonary/Chest: Effort normal and breath sounds normal.  Abdominal: Soft. Bowel sounds are normal.  Neurological: She is alert and oriented to person, place, and time.  Skin: Skin is warm and dry.       CMP Latest Ref Rng & Units 05/26/2016  Glucose 65 - 99 mg/dL 95  BUN 7 - 25 mg/dL 15  Creatinine 0.60 - 0.88 mg/dL 0.87  Sodium 135 - 146 mmol/L 139  Potassium 3.5 - 5.3 mmol/L 3.8  Chloride 98 - 110 mmol/L 108  CO2 20 - 31 mmol/L 27  Calcium 8.6 -  10.4 mg/dL 9.0  Total Protein 6.1 - 8.1 g/dL 6.6  Total Bilirubin 0.2 - 1.2 mg/dL 0.4  Alkaline Phos 33 - 130 U/L 61  AST 10 - 35 U/L 18  ALT 6 - 29 U/L 11   CBC Latest Ref Rng & Units 05/26/2016  WBC 3.8 - 10.8 K/uL 4.3  Hemoglobin 11.7 - 15.5 g/dL 10.7(L)  Hematocrit 35.0 - 45.0 % 33.5(L)  Platelets 140 - 400 K/uL 139(L)    No images are attached to the encounter.  Dg Chest 2 View  Result Date: 05/26/2016 CLINICAL DATA:  Abnormal breath sounds. EXAM: CHEST  2 VIEW COMPARISON:  04/19/2015. FINDINGS: Cardiomegaly with normal pulmonary vascularity. Bilateral from interstitial prominence with small bilateral pleural effusions noted. These findings consistent congestive heart failure . IMPRESSION: Congestive heart failure with mild bilateral pulmonary interstitial edema and small bilateral pleural effusions. Electronically Signed   By: Marcello Moores  Register   On: 05/26/2016 13:25    Assessment and plan- Patient is a 83 y.o. female was been referred to [place] for evaluation of normocytic anemia and thrombocytopenia.  1. Patient's anemia could be secondary to chronic disease from her underlying comorbidities. Today I will obtain a complete anemia evaluation including CBC with differential, pathologically review of her smear, CMP, ferritin and iron studies, folate, TSH, ESR, CRP, reticulocyte count, haptoglobin and myeloma panel.  2. With regards to her thrombocytopenia- this has been mild and platelet counts have been ranging between 110s to 130s over the last 1 year. We will review her smear today and check for  hepatitis C testing as well as stool H. pylori antigen.  Given her age and inclined to monitor her CBC conservatively at this time without any further aggressive intervention such as bone marrow biopsy at this time. However we could certainly consider that in the future if her anemia and thrombocytopenia gets worse and no etiology is evident from the above workup   Thank you for this kind  referral and the opportunity to participate in the care of this patient   Visit Diagnosis 1. Normocytic anemia   2. Thrombocytopenia ([place])     Dr. Randa Evens, MD, MPH [facility] Pager- [phone] 06/19/2016 1:43 PM

## 2016-06-20 ENCOUNTER — Ambulatory Visit (INDEPENDENT_AMBULATORY_CARE_PROVIDER_SITE_OTHER): Payer: Medicare Other | Admitting: Family Medicine

## 2016-06-20 ENCOUNTER — Encounter: Payer: Self-pay | Admitting: Family Medicine

## 2016-06-20 VITALS — BP 122/78 | HR 67 | Temp 97.9°F | Resp 16 | Ht 65.0 in | Wt 164.9 lb

## 2016-06-20 DIAGNOSIS — D692 Other nonthrombocytopenic purpura: Secondary | ICD-10-CM

## 2016-06-20 DIAGNOSIS — D696 Thrombocytopenia, unspecified: Secondary | ICD-10-CM

## 2016-06-20 DIAGNOSIS — I6523 Occlusion and stenosis of bilateral carotid arteries: Secondary | ICD-10-CM

## 2016-06-20 DIAGNOSIS — I509 Heart failure, unspecified: Secondary | ICD-10-CM | POA: Diagnosis not present

## 2016-06-20 DIAGNOSIS — E785 Hyperlipidemia, unspecified: Secondary | ICD-10-CM | POA: Diagnosis not present

## 2016-06-20 DIAGNOSIS — L299 Pruritus, unspecified: Secondary | ICD-10-CM

## 2016-06-20 LAB — HAPTOGLOBIN: Haptoglobin: <10 mg/dL — ABNORMAL LOW (ref 34–200)

## 2016-06-20 LAB — HEPATITIS C ANTIBODY: HCV Ab: 0.2 s/co ratio (ref 0.0–0.9)

## 2016-06-20 MED ORDER — FUROSEMIDE 20 MG PO TABS: 20.0000 mg | ORAL_TABLET | ORAL | 0 refills | Status: DC

## 2016-06-20 MED ORDER — POTASSIUM CHLORIDE CRYS ER 20 MEQ PO TBCR: 20.0000 meq | EXTENDED_RELEASE_TABLET | ORAL | 0 refills | Status: DC

## 2016-06-20 NOTE — Progress Notes (Signed)
Name: Sharon Bullock   MRN: [id number]    DOB: 07/06/1933   Date:06/20/2016       Progress Note  Subjective  Chief Complaint  Chief Complaint  Patient presents with  . Follow-up  . Hyperlipidemia    Started on Crestor and CoQ10 last visit due to muscle cramps. Still having cramps but not as severe  . Pruritis    Onset-2 months ago, started before taking new medications started on right and left shoulder, also on breast area. Put some alcohol on areas and help some.  . Discuss Labs    HPI  Hypertrophic cardiomyopathy and hypertensive pulmonary vascular disease: she sees Dr. Mina Marble,  no chest pain, SOB has improved, no paroxysmal nocturnal dyspnea, the bilateral swelling improved with lasix. She has not seen Dr. Mina Marble since last visit. She is concerned about worsening of her cardiomyopathy. She is on ARB and Pradaxa for afib.   Dyslipidemia: she is on Crestor and CoQ 10 and muscle pain has improved  Thrombocytopenia: seeing hematologist , recent labs unremarkable, multiple myeloma test is pending.   Itching: she has noticed intermittent itching, usually at night, on shoulders and anterior chest, once she scratches she develops a rash, likely hives. She is off all allergy medication because she states it was not working. Explained it may be anxiety  Patient Active Problem List   Diagnosis Date Noted  . Thrombocytopenia ([place]) 05/27/2016  . CHF (congestive heart failure) ([place]) 05/26/2016  . 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 Pancreatic cyst 02/28/2016  . Increased endometrial stripe thickness 02/28/2016  . Carotid atherosclerosis, bilateral 01/23/2016  . Hearing loss 08/17/2015  . Mitral regurgitation 04/19/2015  . Pernicious anemia 09/27/2014  . Paroxysmal a-fib ([place]) 09/27/2014  . Benign essential HTN 09/27/2014  . Perennial allergic rhinitis 09/27/2014  . Chronic anemia 09/27/2014  . Insomnia, persistent 09/27/2014  . Hypertensive pulmonary vascular disease  09/27/2014  . B12 deficiency 09/27/2014  . Diverticulosis of colon 09/27/2014  . Dyslipidemia 09/27/2014  . Edema extremities 09/27/2014  . Gastric reflux 09/27/2014  . Bilateral hearing loss 09/27/2014  . Polypharmacy 09/27/2014  . Pelvic pain in female 09/27/2014  . MI (mitral incompetence) 09/27/2014  . Internal hemorrhoids 09/27/2014  . Osteopenia 09/27/2014  . Arthralgia of shoulder 09/27/2014  . Finger tendinitis 09/27/2014  . Urge incontinence 09/27/2014  . Cervical prolapse 09/27/2014  . Vertigo 09/27/2014  . Vitamin D deficiency 09/27/2014  . Bladder cystocele 04/15/2012  . Asymmetric septal hypertrophy ([place]) 05/19/2011  . Hypertrophic cardiomyopathy ([place]) 05/19/2011    Past Surgical History:  Procedure Laterality Date  . BLADDER REPAIR  2014   tact    Family History  Problem Relation Age of Onset  . Alzheimer's disease Mother   . Hypertension Mother   . Stroke Father   . Heart disease Father   . Heart disease Brother   . Hypertension Brother   . Diabetes Brother   . Cancer Daughter     breast  . Hypertension Daughter   . Breast cancer Daughter     Social History   Social History  . Marital status: Married    Spouse name: N/A  . Number of children: N/A  . Years of education: N/A   Occupational History  . Not on file.   Social History Main Topics  . Smoking status: Never Smoker  . Smokeless tobacco: Never Used  . Alcohol use No  . Drug use: No  . Sexual activity: Not Currently   Other Topics Concern  .  Not on file   Social History Narrative  . No narrative on file     Current Outpatient Prescriptions:  .  amiodarone (PACERONE) 200 MG tablet, Take 1 tablet by mouth daily., Disp: , Rfl:  .  amLODipine (NORVASC) 10 MG tablet, Take 1 tablet (10 mg total) by mouth daily., Disp: 90 tablet, Rfl: 1 .  azelastine (ASTELIN) 0.1 % nasal spray, Place 2 sprays into both nostrils 2 (two) times daily. Use in each nostril as directed, Disp: 30 mL, Rfl:  12 .  carvedilol (COREG) 25 MG tablet, Take 1 tablet (25 mg total) by mouth 2 (two) times daily., Disp: 180 tablet, Rfl: 1 .  cyanocobalamin 1000 MCG tablet, 1,000 mcg daily., Disp: , Rfl:  .  diclofenac sodium (VOLTAREN) 1 % GEL, APPLY TO RIGHT HAND AND FINGER TWICE DAILY, Disp: 716 g, Rfl: 0 .  folic acid (FOLVITE) 967 MCG tablet, Take 1 tablet by mouth daily. Reported on 04/19/2015, Disp: , Rfl:  .  furosemide (LASIX) 20 MG tablet, Take 1 tablet (20 mg total) by mouth every morning. Take for a few days if you noticed swelling and weight gain, Disp: 7 tablet, Rfl: 0 .  irbesartan (AVAPRO) 300 MG tablet, TAKE ONE TABLET BY MOUTH EVERY DAY FOR FOR BLOOD PRESSURE, Disp: 90 tablet, Rfl: 1 .  potassium chloride SA (K-DUR,KLOR-CON) 20 MEQ tablet, Take 1 tablet (20 mEq total) by mouth every morning., Disp: 7 tablet, Rfl: 0 .  PRADAXA 150 MG CAPS capsule, Take 1 tablet by mouth 2 (two) times daily., Disp: , Rfl:  .  rosuvastatin (CRESTOR) 10 MG tablet, Take 0.5-1 tablets (5-10 mg total) by mouth daily. With Co-Q10, Disp: 90 tablet, Rfl: 1 .  Vitamin D, Cholecalciferol, 1000 UNITS TABS, Take 1 tablet by mouth daily., Disp: , Rfl:  .  zolpidem (AMBIEN) 5 MG tablet, Take 1 tablet (5 mg total) by mouth at bedtime as needed for sleep., Disp: 30 tablet, Rfl: 2  Allergies  Allergen Reactions  . Nickel Dermatitis and Swelling  . Other Anaphylaxis    Uncoded Allergy. Allergen: SHELLFISH  . Ace Inhibitors Cough     ROS  Constitutional: Negative for fever, positive for mild  weight change.  Respiratory: positive  for intermittent cough and shortness of breath ( much better now ).   Cardiovascular: Negative for chest pain or palpitations.  Gastrointestinal: Negative for abdominal pain, no bowel changes.  Musculoskeletal: Negative for gait problem or joint swelling.  Skin: Negative for rash, but she has itching.  Neurological: Negative for dizziness or headache.  No other specific complaints in a complete  review of systems (except as listed in HPI above).  Objective   Vitals:   06/20/16 1007  BP: 122/78  Pulse: 67  Resp: 16  Temp: 97.9 F (36.6 C)  TempSrc: Oral  SpO2: 97%  Weight: 164 lb 14.4 oz (74.8 kg)  Height: _0  (1.651 m)    Body mass index is 27.44 kg/m.  Physical Exam  Constitutional: Patient appears well-developed and well-nourished. Obese  No distress.  HEENT: head atraumatic, normocephalic, pupils equal and reactive to light,  neck supple, throat within normal limits Cardiovascular: Normal rate, regular rhythm and normal heart sounds. 3/6 SEM. No BLE edema. Pulmonary/Chest: Effort normal, crackles very mild on basis . No respiratory distress. Abdominal: Soft.  There is no tenderness. Psychiatric: Patient has a normal mood and affect. behavior is normal. Judgment and thought content normal. Seems forgetful, repeating same question - patient refuses to  have MMS test at this time Skin:rash not present except for senile purpura on right arm  Recent Results (from the past 2160 hour(s))  Brain natriuretic peptide     Status: Abnormal   Collection Time: 05/26/16  9:57 AM  Result Value Ref Range   Brain Natriuretic Peptide 309.9 (H) <100 pg/mL    Comment:   BNP levels increase with age in the general population with the highest values seen in individuals greater than 19 years of age. Reference: Joellyn Rued Cardiol 2002; 71:062-69.     Lipid panel     Status: None   Collection Time: 05/26/16  9:57 AM  Result Value Ref Range   Cholesterol 164 <200 mg/dL   Triglycerides 66 <150 mg/dL   HDL 84 >50 mg/dL   Total CHOL/HDL Ratio 2.0 <5.0 Ratio   VLDL 13 <30 mg/dL   LDL Cholesterol 67 <100 mg/dL  COMPLETE METABOLIC PANEL WITH GFR     Status: None   Collection Time: 05/26/16  9:57 AM  Result Value Ref Range   Sodium 139 135 - 146 mmol/L   Potassium 3.8 3.5 - 5.3 mmol/L   Chloride 108 98 - 110 mmol/L   CO2 27 20 - 31 mmol/L   Glucose, Bld 95 65 - 99 mg/dL   BUN 15  7 - 25 mg/dL   Creat 0.87 0.60 - 0.88 mg/dL    Comment:   For patients > or = 81 years of age: The upper reference limit for Creatinine is approximately 13% higher for people identified as African-American.      Total Bilirubin 0.4 0.2 - 1.2 mg/dL   Alkaline Phosphatase 61 33 - 130 U/L   AST 18 10 - 35 U/L   ALT 11 6 - 29 U/L   Total Protein 6.6 6.1 - 8.1 g/dL   Albumin 3.7 3.6 - 5.1 g/dL   Calcium 9.0 8.6 - 10.4 mg/dL   GFR, Est African American 73 >=60 mL/min   GFR, Est Non African American 63 >=60 mL/min  CBC with Differential/Platelet     Status: Abnormal   Collection Time: 05/26/16  9:57 AM  Result Value Ref Range   WBC 4.3 3.8 - 10.8 K/uL   RBC 3.53 (L) 3.80 - 5.10 MIL/uL   Hemoglobin 10.7 (L) 11.7 - 15.5 g/dL   HCT 33.5 (L) 35.0 - 45.0 %   MCV 94.9 80.0 - 100.0 fL   MCH 30.3 27.0 - 33.0 pg   MCHC 31.9 (L) 32.0 - 36.0 g/dL   RDW 13.7 11.0 - 15.0 %   Platelets 139 (L) 140 - 400 K/uL   MPV 11.1 7.5 - 12.5 fL   Neutro Abs 2,537 1,500 - 7,800 cells/uL   Lymphs Abs 1,290 850 - 3,900 cells/uL   Monocytes Absolute 387 200 - 950 cells/uL   Eosinophils Absolute 43 15 - 500 cells/uL   Basophils Absolute 43 0 - 200 cells/uL   Neutrophils Relative % 59 %   Lymphocytes Relative 30 %   Monocytes Relative 9 %   Eosinophils Relative 1 %   Basophils Relative 1 %   Smear Review Criteria for review not met   Vitamin B12     Status: None   Collection Time: 05/26/16  9:57 AM  Result Value Ref Range   Vitamin B-12 642 200 - 1,100 pg/mL  VITAMIN D 25 Hydroxy (Vit-D Deficiency, Fractures)     Status: None   Collection Time: 05/26/16  9:57 AM  Result Value Ref  Range   Vit D, 25-Hydroxy 37 30 - 100 ng/mL    Comment: Vitamin D Status           25-OH Vitamin D        Deficiency                <20 ng/mL        Insufficiency         20 - 29 ng/mL        Optimal             > or = 30 ng/mL   For 25-OH Vitamin D testing on patients on D2-supplementation and patients for whom quantitation  of D2 and D3 fractions is required, the QuestAssureD 25-OH VIT D, (D2,D3), LC/MS/MS is recommended: order code [phone] (patients > 2 yrs).   CBC with Differential/Platelet     Status: Abnormal   Collection Time: 06/19/16 11:25 AM  Result Value Ref Range   WBC 4.7 3.6 - 11.0 K/uL   RBC 3.60 (L) 3.80 - 5.20 MIL/uL   Hemoglobin 11.1 (L) 12.0 - 16.0 g/dL   HCT 33.1 (L) 35.0 - 47.0 %   MCV 92.0 80.0 - 100.0 fL   MCH 30.8 26.0 - 34.0 pg   MCHC 33.5 32.0 - 36.0 g/dL   RDW 13.7 11.5 - 14.5 %   Platelets 128 (L) 150 - 440 K/uL   Neutrophils Relative % 62 %   Neutro Abs 2.9 1.4 - 6.5 K/uL   Lymphocytes Relative 27 %   Lymphs Abs 1.3 1.0 - 3.6 K/uL   Monocytes Relative 9 %   Monocytes Absolute 0.4 0.2 - 0.9 K/uL   Eosinophils Relative 1 %   Eosinophils Absolute 0.1 0 - 0.7 K/uL   Basophils Relative 1 %   Basophils Absolute 0.0 0 - 0.1 K/uL  Pathologist smear review     Status: None   Collection Time: 06/19/16 11:25 AM  Result Value Ref Range   Path Review      Peripheral smear review shows thrombocytopenia and normocytic anemia. WBC morphology is normal.  Dr. Luana Shu.  Comprehensive metabolic panel     Status: Abnormal   Collection Time: 06/19/16 11:25 AM  Result Value Ref Range   Sodium 137 135 - 145 mmol/L   Potassium 3.6 3.5 - 5.1 mmol/L   Chloride 104 101 - 111 mmol/L   CO2 27 22 - 32 mmol/L   Glucose, Bld 96 65 - 99 mg/dL   BUN 18 6 - 20 mg/dL   Creatinine, Ser 0.91 0.44 - 1.00 mg/dL   Calcium 9.2 8.9 - 10.3 mg/dL   Total Protein 7.1 6.5 - 8.1 g/dL   Albumin 4.1 3.5 - 5.0 g/dL   AST 25 15 - 41 U/L   ALT 15 14 - 54 U/L   Alkaline Phosphatase 64 38 - 126 U/L   Total Bilirubin 0.5 0.3 - 1.2 mg/dL   GFR calc non Af Amer 58 (L) >60 mL/min   GFR calc Af Amer >60 >60 mL/min    Comment: (NOTE) The eGFR has been calculated using the CKD EPI equation. This calculation has not been validated in all clinical situations. eGFR's persistently <60 mL/min signify possible Chronic  Kidney Disease.    Anion gap 6 5 - 15  Ferritin     Status: None   Collection Time: 06/19/16 11:25 AM  Result Value Ref Range   Ferritin 58 11 - 307 ng/mL  Folate     Status:  None   Collection Time: 06/19/16 11:25 AM  Result Value Ref Range   Folate 32.0 >5.9 ng/mL  Iron and TIBC     Status: None   Collection Time: 06/19/16 11:25 AM  Result Value Ref Range   Iron 58 28 - 170 ug/dL   TIBC 267 250 - 450 ug/dL   Saturation Ratios 22 10.4 - 31.8 %   UIBC 209 ug/dL  TSH     Status: None   Collection Time: 06/19/16 11:25 AM  Result Value Ref Range   TSH 0.912 0.350 - 4.500 uIU/mL    Comment: Performed by a 3rd Generation assay with a functional sensitivity of <=0.01 uIU/mL.  Reticulocytes     Status: Abnormal   Collection Time: 06/19/16 11:25 AM  Result Value Ref Range   Retic Ct Pct 1.1 0.4 - 3.1 %   RBC. 3.68 (L) 3.80 - 5.20 MIL/uL   Retic Count, Manual 40.5 19.0 - 183.0 K/uL  Haptoglobin     Status: Abnormal   Collection Time: 06/19/16 11:25 AM  Result Value Ref Range   Haptoglobin <10 (L) 34 - 200 mg/dL    Comment: (NOTE) Performed At: [facility] [place], [place] [id number] Lindon Romp MD YI:[id number]   Hepatitis C Antibody     Status: None   Collection Time: 06/19/16 11:25 AM  Result Value Ref Range   HCV Ab 0.2 0.0 - 0.9 s/co ratio    Comment: (NOTE)                                  Negative:     < 0.8                             Indeterminate: 0.8 - 0.9                                  Positive:     > 0.9 The CDC recommends that a positive HCV antibody result be followed up with a HCV Nucleic Acid Amplification test ([id number]). Performed At: [facility] [place], [place] [id number] Lindon Romp MD ZM:[id number]       PHQ2/9: Depression screen [facility] 2/9 05/26/2016 01/23/2016 08/17/2015 04/19/2015 01/03/2015  Decreased Interest 0 0 0 0 0  Down, Depressed, Hopeless 0 0 0 0 0  PHQ - 2 Score 0 0 0 0 0      Fall Risk: Fall Risk  05/26/2016 08/17/2015 04/19/2015 01/03/2015 09/28/2014  Falls in the past year? No No No Yes Yes  Number falls in past yr: - - - 1 1  Injury with Fall? - - - No No     Assessment & Plan  1. Thrombocytopenia ([place])  Seeing Dr. Janese Banks, had multiple labs done, Multiple Myeloma testing pending  2. Chronic congestive heart failure, unspecified congestive heart failure type ([place])  Doing better, still has crackles on bases, swelling is down, I discussed referral to CHF clinic, but she wants to see Dr. Mina Marble first.  - potassium chloride SA (K-DUR,KLOR-CON) 20 MEQ tablet; Take 1 tablet (20 mEq total) by mouth every morning.  Dispense: 7 tablet; Refill: 0 - furosemide (LASIX) 20 MG tablet; Take 1 tablet (20 mg total) by mouth every morning. Take for a few days if you noticed swelling  and weight gain  Dispense: 7 tablet; Refill: 0  3. Dyslipidemia  Continue medication   4. Pruritus  Seeing pulmonologist and allergist, off medications because innefective Explained it may be anxiety, or dry skin, try lotion before bed time  5. Senile purpura ([place])  reassurance

## 2016-06-23 ENCOUNTER — Other Ambulatory Visit: Payer: Self-pay

## 2016-06-23 DIAGNOSIS — D696 Thrombocytopenia, unspecified: Secondary | ICD-10-CM

## 2016-06-23 DIAGNOSIS — I110 Hypertensive heart disease with heart failure: Secondary | ICD-10-CM | POA: Diagnosis not present

## 2016-06-23 DIAGNOSIS — E538 Deficiency of other specified B group vitamins: Secondary | ICD-10-CM | POA: Diagnosis not present

## 2016-06-23 DIAGNOSIS — D649 Anemia, unspecified: Secondary | ICD-10-CM | POA: Diagnosis not present

## 2016-06-23 DIAGNOSIS — R5381 Other malaise: Secondary | ICD-10-CM | POA: Diagnosis not present

## 2016-06-23 DIAGNOSIS — R5382 Chronic fatigue, unspecified: Secondary | ICD-10-CM | POA: Diagnosis not present

## 2016-06-24 LAB — MULTIPLE MYELOMA PANEL, SERUM
ALPHA 1: 0.2 g/dL (ref 0.0–0.4)
ALPHA2 GLOB SERPL ELPH-MCNC: 0.5 g/dL (ref 0.4–1.0)
Albumin SerPl Elph-Mcnc: 3.9 g/dL (ref 2.9–4.4)
Albumin/Glob SerPl: 1.4 (ref 0.7–1.7)
B-Globulin SerPl Elph-Mcnc: 0.8 g/dL (ref 0.7–1.3)
Gamma Glob SerPl Elph-Mcnc: 1.6 g/dL (ref 0.4–1.8)
Globulin, Total: 3 g/dL (ref 2.2–3.9)
IGG (IMMUNOGLOBIN G), SERUM: 1523 mg/dL (ref 700–1600)
IgA: 170 mg/dL (ref 64–422)
IgM, Serum: 34 mg/dL (ref 26–217)
TOTAL PROTEIN ELP: 6.9 g/dL (ref 6.0–8.5)

## 2016-06-25 LAB — H. PYLORI ANTIGEN, STOOL: H. Pylori Stool Ag, Eia: NEGATIVE

## 2016-07-10 ENCOUNTER — Encounter: Payer: Self-pay | Admitting: Oncology

## 2016-07-10 ENCOUNTER — Inpatient Hospital Stay (HOSPITAL_BASED_OUTPATIENT_CLINIC_OR_DEPARTMENT_OTHER): Payer: Medicare Other | Admitting: Oncology

## 2016-07-10 VITALS — BP 144/67 | HR 58 | Temp 93.6°F | Resp 18 | Wt 169.6 lb

## 2016-07-10 DIAGNOSIS — D649 Anemia, unspecified: Secondary | ICD-10-CM | POA: Diagnosis not present

## 2016-07-10 DIAGNOSIS — I480 Paroxysmal atrial fibrillation: Secondary | ICD-10-CM

## 2016-07-10 DIAGNOSIS — R5382 Chronic fatigue, unspecified: Secondary | ICD-10-CM

## 2016-07-10 DIAGNOSIS — E538 Deficiency of other specified B group vitamins: Secondary | ICD-10-CM | POA: Diagnosis not present

## 2016-07-10 DIAGNOSIS — I6529 Occlusion and stenosis of unspecified carotid artery: Secondary | ICD-10-CM | POA: Diagnosis not present

## 2016-07-10 DIAGNOSIS — I340 Nonrheumatic mitral (valve) insufficiency: Secondary | ICD-10-CM

## 2016-07-10 DIAGNOSIS — Z803 Family history of malignant neoplasm of breast: Secondary | ICD-10-CM

## 2016-07-10 DIAGNOSIS — R5381 Other malaise: Secondary | ICD-10-CM

## 2016-07-10 DIAGNOSIS — Z79899 Other long term (current) drug therapy: Secondary | ICD-10-CM

## 2016-07-10 DIAGNOSIS — I422 Other hypertrophic cardiomyopathy: Secondary | ICD-10-CM

## 2016-07-10 DIAGNOSIS — E785 Hyperlipidemia, unspecified: Secondary | ICD-10-CM | POA: Diagnosis not present

## 2016-07-10 DIAGNOSIS — M8580 Other specified disorders of bone density and structure, unspecified site: Secondary | ICD-10-CM

## 2016-07-10 DIAGNOSIS — I272 Pulmonary hypertension, unspecified: Secondary | ICD-10-CM

## 2016-07-10 DIAGNOSIS — Z7901 Long term (current) use of anticoagulants: Secondary | ICD-10-CM

## 2016-07-10 DIAGNOSIS — K5730 Diverticulosis of large intestine without perforation or abscess without bleeding: Secondary | ICD-10-CM

## 2016-07-10 DIAGNOSIS — J309 Allergic rhinitis, unspecified: Secondary | ICD-10-CM

## 2016-07-10 DIAGNOSIS — D696 Thrombocytopenia, unspecified: Secondary | ICD-10-CM

## 2016-07-10 DIAGNOSIS — G4700 Insomnia, unspecified: Secondary | ICD-10-CM

## 2016-07-10 DIAGNOSIS — I110 Hypertensive heart disease with heart failure: Secondary | ICD-10-CM | POA: Diagnosis not present

## 2016-07-10 DIAGNOSIS — K219 Gastro-esophageal reflux disease without esophagitis: Secondary | ICD-10-CM

## 2016-07-10 NOTE — Progress Notes (Signed)
Hematology/Oncology Consult note [facility]  [phone] [phone]  Patient Care Team: Steele Sizer, MD as PCP - General (Family Medicine)   Name of the patient: Sharon Bullock  [id number]  Feb 10, 1934   Date of visit: 07/10/16  Diagnosis- 1. Anemia likely secondary to chronic disease. 2. 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 Patient had a recent CBC which showed white count of 4.3, H&H of 10.7/33.5 and a platelet count of 139. She has been sent to [place] for evaluation and management of thrombocytopenia. Prior CBC from May 2017 showed white count of 5, H&H of 11.4/74.9 and a platelet count of 177. Looking at her prior counts from 2007 2017, a she has had normal platelet count of 04/15/2012. Between October and November 2017 patient's platelet count was 128, 116, 141 respectively. Her H&H during this time has been trending between 10.3-11.  Results of blood work from 06/19/2016 were as follows: CBC showed white count of 4.7, H&H of 11.1/33.4 and a platelet count of 128. Review of peripheral smear showed normocytic anemia and thrombocytopenia but no other significant findings. CMP was within normal limits. Ferritin was 58 and iron studies were within normal limits. Folate was within normal limits. TSH was normal. Reticulocyte count was normal at 1.1%. Haptoglobin was less than 10. Hep C antibody was negative. Multiple myeloma panel did not reveal any monoclonal protein. H pylori stool antigen was negative.  Interval history- overall patient is doing well and reports no significant complaints today  ECOG PS- 1 Pain scale- 0 Opioid associated constipation- no  Review of  systems- Review of Systems  Constitutional: Positive for malaise/fatigue. Negative for chills, fever and weight loss.  HENT: Negative for congestion, ear discharge and nosebleeds.   Eyes: Negative for blurred vision.  Respiratory: Negative for cough, hemoptysis, sputum production, shortness of breath and wheezing.   Cardiovascular: Negative for chest pain, palpitations, orthopnea and claudication.  Gastrointestinal: Negative for abdominal pain, blood in stool, constipation, diarrhea, heartburn, melena, nausea and vomiting.  Genitourinary: Negative for dysuria, flank pain, frequency, hematuria and urgency.  Musculoskeletal: Negative for back pain, joint pain and myalgias.  Skin: Negative for rash.  Neurological: Negative for dizziness, tingling, focal weakness, seizures, weakness and headaches.  Endo/Heme/Allergies: Does not bruise/bleed easily.  Psychiatric/Behavioral: Negative for depression and suicidal ideas. The patient does not have insomnia.      Current treatment- observation  Allergies  Allergen Reactions  . Nickel Dermatitis and Swelling  . Other Anaphylaxis    Uncoded Allergy. Allergen: SHELLFISH  . Ace Inhibitors Cough     Past Medical History:  Diagnosis Date  . Allergy   . Enlarged heart 2000  . GERD (gastroesophageal reflux disease)   . Hypertension   . Insomnia   . Vertigo 2006     Past Surgical History:  Procedure Laterality Date  . BLADDER REPAIR  2014   tact    Social History   Social History  . Marital status: Married    Spouse name: N/A  . Number of children: N/A  . Years of education: N/A   Occupational History  . Not on file.   Social History Main Topics  . Smoking status: Never Smoker  . Smokeless tobacco: Never Used  . Alcohol use No  .  Drug use: No  . Sexual activity: Not Currently   Other Topics Concern  . Not on file   Social History Narrative  . No narrative on file    Family History  Problem Relation Age of Onset  .  Alzheimer's disease Mother   . Hypertension Mother   . Stroke Father   . Heart disease Father   . Heart disease Brother   . Hypertension Brother   . Diabetes Brother   . Cancer Daughter     breast  . Hypertension Daughter   . Breast cancer Daughter      Current Outpatient Prescriptions:  .  amiodarone (PACERONE) 200 MG tablet, Take 1 tablet by mouth daily., Disp: , Rfl:  .  amLODipine (NORVASC) 10 MG tablet, Take 1 tablet (10 mg total) by mouth daily., Disp: 90 tablet, Rfl: 1 .  azelastine (ASTELIN) 0.1 % nasal spray, Place 2 sprays into both nostrils 2 (two) times daily. Use in each nostril as directed, Disp: 30 mL, Rfl: 12 .  carvedilol (COREG) 25 MG tablet, Take 1 tablet (25 mg total) by mouth 2 (two) times daily., Disp: 180 tablet, Rfl: 1 .  cyanocobalamin 1000 MCG tablet, 1,000 mcg daily., Disp: , Rfl:  .  diclofenac sodium (VOLTAREN) 1 % GEL, APPLY TO RIGHT HAND AND FINGER TWICE DAILY, Disp: 841 g, Rfl: 0 .  folic acid (FOLVITE) 324 MCG tablet, Take 1 tablet by mouth daily. Reported on 04/19/2015, Disp: , Rfl:  .  furosemide (LASIX) 20 MG tablet, Take 1 tablet (20 mg total) by mouth every morning. Take for a few days if you noticed swelling and weight gain, Disp: 7 tablet, Rfl: 0 .  irbesartan (AVAPRO) 300 MG tablet, TAKE ONE TABLET BY MOUTH EVERY DAY FOR FOR BLOOD PRESSURE, Disp: 90 tablet, Rfl: 1 .  omeprazole (PRILOSEC) 40 MG capsule, , Disp: , Rfl:  .  potassium chloride SA (K-DUR,KLOR-CON) 20 MEQ tablet, Take 1 tablet (20 mEq total) by mouth every morning., Disp: 7 tablet, Rfl: 0 .  PRADAXA 150 MG CAPS capsule, Take 1 tablet by mouth 2 (two) times daily., Disp: , Rfl:  .  rosuvastatin (CRESTOR) 10 MG tablet, Take 0.5-1 tablets (5-10 mg total) by mouth daily. With Co-Q10, Disp: 90 tablet, Rfl: 1 .  Vitamin D, Cholecalciferol, 1000 UNITS TABS, Take 1 tablet by mouth daily., Disp: , Rfl:  .  zolpidem (AMBIEN) 5 MG tablet, Take 1 tablet (5 mg total) by mouth at bedtime as needed for  sleep., Disp: 30 tablet, Rfl: 2  Physical exam:  Vitals:   07/10/16 0943  BP: (!) 144/67  Pulse: (!) 58  Resp: 18  Temp: (!) 93.6 F (34.2 C)  TempSrc: Tympanic  Weight: 169 lb 9.6 oz (76.9 kg)   Physical Exam  Constitutional: She is oriented to person, place, and time and well-developed, well-nourished, and in no distress.  HENT:  Head: Normocephalic and atraumatic.  Eyes: EOM are normal. Pupils are equal, round, and reactive to light.  Neck: Normal range of motion.  Cardiovascular: Normal rate and regular rhythm.   Murmur heard. Pulmonary/Chest: Effort normal and breath sounds normal.  Abdominal: Soft. Bowel sounds are normal.  Neurological: She is alert and oriented to person, place, and time.  Skin: Skin is warm and dry.     CMP Latest Ref Rng & Units 06/19/2016  Glucose 65 - 99 mg/dL 96  BUN 6 - 20 mg/dL 18  Creatinine 0.44 - 1.00 mg/dL 0.91  Sodium 135 -  145 mmol/L 137  Potassium 3.5 - 5.1 mmol/L 3.6  Chloride 101 - 111 mmol/L 104  CO2 22 - 32 mmol/L 27  Calcium 8.9 - 10.3 mg/dL 9.2  Total Protein 6.5 - 8.1 g/dL 7.1  Total Bilirubin 0.3 - 1.2 mg/dL 0.5  Alkaline Phos 38 - 126 U/L 64  AST 15 - 41 U/L 25  ALT 14 - 54 U/L 15   CBC Latest Ref Rng & Units 06/19/2016  WBC 3.6 - 11.0 K/uL 4.7  Hemoglobin 12.0 - 16.0 g/dL 11.1(L)  Hematocrit 35.0 - 47.0 % 33.1(L)  Platelets 150 - 440 K/uL 128(L)     Assessment and plan- Patient is a 83 y.o. female was referred to [place] for evaluation and management of thrombocytopenia  1. Overall patient's thrombocytopenia is mild and a platelet count ranges between 120s to 130s. Patient does not have any signs and symptoms of bleeding. Hepatitis C and stool H. pylori antigen was negative. At this time I would watch her platelet counts conservatively without any further intervention at this time.  2. Normocytic anemia- I discussed the results of the blood work with the patient which did not reveal any significant cause of her anemia. This  could be secondary to chronic disease. I will see her back in 4 months time with a repeat CBC   Visit Diagnosis 1. Thrombocytopenia ([place])   2. Normocytic anemia      Dr. Randa Evens, MD, MPH [facility] at [facility] Pager- [phone] 07/10/2016 10:32 AM

## 2016-07-10 NOTE — Progress Notes (Signed)
Here for follow up. Stated she is having some vertigo sx at this time.

## 2016-07-20 ENCOUNTER — Other Ambulatory Visit: Payer: Self-pay | Admitting: Family Medicine

## 2016-07-20 DIAGNOSIS — I10 Essential (primary) hypertension: Secondary | ICD-10-CM

## 2016-07-21 NOTE — Telephone Encounter (Signed)
Patient requesting refill of Irbesartan to [facility].

## 2016-08-05 ENCOUNTER — Telehealth: Payer: Self-pay | Admitting: Family Medicine

## 2016-08-05 NOTE — Telephone Encounter (Signed)
Pt is on Ambien 5mg  and she is not sleeping like she would like. She wants to know if you could change the dosage to 10mg  instead of 5mg . [facility] is [facility] [address]

## 2016-08-05 NOTE — Telephone Encounter (Signed)
Based on FDA I can't , ask her to try taking Melatonin with the Ambien, or we can try Ambien CR 6.25 mg

## 2016-08-14 DIAGNOSIS — H6121 Impacted cerumen, right ear: Secondary | ICD-10-CM | POA: Diagnosis not present

## 2016-08-25 ENCOUNTER — Ambulatory Visit (INDEPENDENT_AMBULATORY_CARE_PROVIDER_SITE_OTHER): Payer: Medicare Other | Admitting: Family Medicine

## 2016-08-25 ENCOUNTER — Encounter: Payer: Self-pay | Admitting: Family Medicine

## 2016-08-25 VITALS — BP 130/74 | HR 67 | Temp 97.9°F | Resp 16 | Ht 65.0 in | Wt 167.4 lb

## 2016-08-25 DIAGNOSIS — E538 Deficiency of other specified B group vitamins: Secondary | ICD-10-CM | POA: Diagnosis not present

## 2016-08-25 DIAGNOSIS — I272 Pulmonary hypertension, unspecified: Secondary | ICD-10-CM

## 2016-08-25 DIAGNOSIS — R1011 Right upper quadrant pain: Secondary | ICD-10-CM | POA: Diagnosis not present

## 2016-08-25 DIAGNOSIS — I6523 Occlusion and stenosis of bilateral carotid arteries: Secondary | ICD-10-CM

## 2016-08-25 DIAGNOSIS — E785 Hyperlipidemia, unspecified: Secondary | ICD-10-CM

## 2016-08-25 DIAGNOSIS — K551 Chronic vascular disorders of intestine: Secondary | ICD-10-CM

## 2016-08-25 DIAGNOSIS — M25531 Pain in right wrist: Secondary | ICD-10-CM | POA: Diagnosis not present

## 2016-08-25 DIAGNOSIS — D696 Thrombocytopenia, unspecified: Secondary | ICD-10-CM | POA: Diagnosis not present

## 2016-08-25 DIAGNOSIS — I10 Essential (primary) hypertension: Secondary | ICD-10-CM | POA: Diagnosis not present

## 2016-08-25 DIAGNOSIS — G4700 Insomnia, unspecified: Secondary | ICD-10-CM | POA: Diagnosis not present

## 2016-08-25 DIAGNOSIS — I480 Paroxysmal atrial fibrillation: Secondary | ICD-10-CM | POA: Diagnosis not present

## 2016-08-25 DIAGNOSIS — I422 Other hypertrophic cardiomyopathy: Secondary | ICD-10-CM

## 2016-08-25 MED ORDER — CARVEDILOL 25 MG PO TABS: 25.0000 mg | ORAL_TABLET | Freq: Two times a day (BID) | ORAL | 1 refills | Status: DC

## 2016-08-25 MED ORDER — AMLODIPINE BESYLATE 10 MG PO TABS: 10.0000 mg | ORAL_TABLET | Freq: Every day | ORAL | 1 refills | Status: DC

## 2016-08-25 MED ORDER — DICLOFENAC SODIUM 1 % TD GEL: TRANSDERMAL | 0 refills | Status: DC

## 2016-08-25 MED ORDER — ZOLPIDEM TARTRATE ER 6.25 MG PO TBCR: 6.2500 mg | EXTENDED_RELEASE_TABLET | Freq: Every evening | ORAL | 2 refills | Status: DC | PRN

## 2016-08-25 MED ORDER — ROSUVASTATIN CALCIUM 10 MG PO TABS: 5.0000 mg | ORAL_TABLET | Freq: Every day | ORAL | 1 refills | Status: DC

## 2016-08-25 MED ORDER — CYANOCOBALAMIN 1000 MCG/ML IJ SOLN
1000.0000 ug | Freq: Once | INTRAMUSCULAR | Status: AC
  Administered 2016-08-25: 1000 ug via INTRAMUSCULAR

## 2016-08-25 MED ORDER — IRBESARTAN 300 MG PO TABS: ORAL_TABLET | ORAL | 1 refills | Status: DC

## 2016-08-25 NOTE — Addendum Note (Signed)
Addended by: Alba Cory F on: 08/25/2016 10:22 AM   Modules accepted: Orders

## 2016-08-25 NOTE — Progress Notes (Addendum)
Name: Makaria Poarch   MRN: [id number]    DOB: [birth year]   Date:08/25/2016       Progress Note  Subjective  Chief Complaint  Chief Complaint  Patient presents with  . Medication Refill    3 month F/U  . Gastroesophageal Reflux    Stable with medication  . Hypertension    Headaches occasionally and dizziness-feel like it was associated to Vertigo  . Hyperlipidemia  . Insomnia    States it is not good sleeping on average-2 to 3 hours nightly    HPI   HTN: bp is at goal today, no orthostatic changes, chest pain, SOB  or palpitation. She has lower extremity edema - likely from Norvasc  Carotid atherosclerosis: found during MRI head done at [facility] at [facility] for evaluation of vertigo in 12/2015.  On medical management at this time. She also has mesenteric insufficiency, she denies abdominal pain or blood in stools   Perennial allergic rhinitis: she has post-nasal drainage, symptoms started 27 years ago when she moved to [place], but used to be intermittent but now is daily. She states at times she has a hoarseness. No facial pain , no fever.  B12 deficiency with a previous history of pernicious anemia: she is receiving monthly B12 shots and has been doing well  Afib: she sees a cardiologist - Dr. Mina Marble at [facility]. She has been on Pradaxa, she denies palpitation, she has some edema but no  SOB  Insomnia: She tried Trazodone without help, only able to sleep one hour per night, she is back on  Ambien at lower dose of 5 mg but states only sleeping 2-3 hours , she wants to go up to Ambien 10 mg, but advised to try Ambien CR 6.25 but also discussed Seroquel   Hypertrophic cardiomyopathy and hypertensive pulmonary vascular disease: she sees Dr. Mina Marble,  no chest pain, SOB has improved, no paroxysmal nocturnal dyspnea, the bilateral swelling is back to baseline. She will discuss with Dr. Mina Marble the fact that her lower leg edema gets worse during the heat. She is on ARB and Pradaxa for afib.   Dyslipidemia:  she is on Crestor and CoQ 10 and muscle pain has improved  Thrombocytopenia: seeing hematologist , recent labs unremarkable, she was given reassurance by Dr Janese Banks and also has normocytic anemia, likely of chronic disease  Right wrist pain: she has intermittent pain on right wrist and hand, dull ache and worse with activity and better with rest.  RUQ pain: she states pain is described as aching on RUQ, constant and dull. She denies nausea, vomiting or change in appetite. She still has a gallbladder. No change in bowel movements or dysuria. Present for the past week   Patient Active Problem List   Diagnosis Date Noted  . Thrombocytopenia ([place]) 05/27/2016  . CHF (congestive heart failure) ([place]) 05/26/2016  . Mesenteric vascular insufficiency ([place]) 02/28/2016  . Pancreatic cyst 02/28/2016  . Increased endometrial stripe thickness 02/28/2016  . Carotid atherosclerosis, bilateral 01/23/2016  . Hearing loss 08/17/2015  . Mitral regurgitation 04/19/2015  . Pernicious anemia 09/27/2014  . Paroxysmal A-fib ([place]) 09/27/2014  . Benign essential HTN 09/27/2014  . Perennial allergic rhinitis 09/27/2014  . Chronic anemia 09/27/2014  . Insomnia, persistent 09/27/2014  . Hypertensive pulmonary vascular disease ([place]) 09/27/2014  . B12 deficiency 09/27/2014  . Diverticulosis of colon 09/27/2014  . Dyslipidemia 09/27/2014  . Edema extremities 09/27/2014  . Gastric reflux 09/27/2014  . Bilateral hearing loss 09/27/2014  . Polypharmacy 09/27/2014  .  Pelvic pain in female 09/27/2014  . MI (mitral incompetence) 09/27/2014  . Internal hemorrhoids 09/27/2014  . Osteopenia 09/27/2014  . Arthralgia of shoulder 09/27/2014  . Finger tendinitis 09/27/2014  . Urge incontinence 09/27/2014  . Cervical prolapse 09/27/2014  . Vertigo 09/27/2014  . Vitamin D deficiency 09/27/2014  . Bladder cystocele 04/15/2012  . Asymmetric septal hypertrophy ([place]) 05/19/2011  . Hypertrophic cardiomyopathy ([place]) 05/19/2011     Past Surgical History:  Procedure Laterality Date  . BLADDER REPAIR  2014   tact    Family History  Problem Relation Age of Onset  . Alzheimer's disease Mother   . Hypertension Mother   . Stroke Father   . Heart disease Father   . Heart disease Brother   . Hypertension Brother   . Diabetes Brother   . Cancer Daughter        breast  . Hypertension Daughter   . Breast cancer Daughter     Social History   Social History  . Marital status: Married    Spouse name: N/A  . Number of children: N/A  . Years of education: N/A   Occupational History  . Not on file.   Social History Main Topics  . Smoking status: Never Smoker  . Smokeless tobacco: Never Used  . Alcohol use No  . Drug use: No  . Sexual activity: Not Currently   Other Topics Concern  . Not on file   Social History Narrative  . No narrative on file     Current Outpatient Prescriptions:  .  amiodarone (PACERONE) 200 MG tablet, Take 1 tablet by mouth daily., Disp: , Rfl:  .  amLODipine (NORVASC) 10 MG tablet, Take 1 tablet (10 mg total) by mouth daily., Disp: 90 tablet, Rfl: 1 .  azelastine (ASTELIN) 0.1 % nasal spray, Place 2 sprays into both nostrils 2 (two) times daily. Use in each nostril as directed, Disp: 30 mL, Rfl: 12 .  carvedilol (COREG) 25 MG tablet, Take 1 tablet (25 mg total) by mouth 2 (two) times daily., Disp: 180 tablet, Rfl: 1 .  cyanocobalamin 1000 MCG tablet, 1,000 mcg daily., Disp: , Rfl:  .  diclofenac sodium (VOLTAREN) 1 % GEL, Wrist twice daily, Disp: 161 g, Rfl: 0 .  folic acid (FOLVITE) 096 MCG tablet, Take 1 tablet by mouth daily. Reported on 04/19/2015, Disp: , Rfl:  .  irbesartan (AVAPRO) 300 MG tablet, TAKE ONE TABLET BY MOUTH EVERY DAY FOR FOR BLOOD PRESSURE, Disp: 90 tablet, Rfl: 1 .  omeprazole (PRILOSEC) 40 MG capsule, , Disp: , Rfl:  .  PRADAXA 150 MG CAPS capsule, Take 1 tablet by mouth 2 (two) times daily., Disp: , Rfl:  .  rosuvastatin (CRESTOR) 10 MG tablet, Take  0.5-1 tablets (5-10 mg total) by mouth daily. With Co-Q10, Disp: 90 tablet, Rfl: 1 .  Vitamin D, Cholecalciferol, 1000 UNITS TABS, Take 1 tablet by mouth daily., Disp: , Rfl:  .  zolpidem (AMBIEN CR) 6.25 MG CR tablet, Take 1 tablet (6.25 mg total) by mouth at bedtime as needed for sleep., Disp: 30 tablet, Rfl: 2  Allergies  Allergen Reactions  . Nickel Dermatitis and Swelling  . Other Anaphylaxis    Uncoded Allergy. Allergen: SHELLFISH  . Ace Inhibitors Cough     ROS  Constitutional: Negative for fever or weight change.  Respiratory: Negative for cough and shortness of breath.   Cardiovascular: Negative for chest pain or palpitations.  Gastrointestinal: Negative for abdominal pain, no bowel changes.  Musculoskeletal:  Negative for gait problem or joint swelling.  Skin: Negative for rash.  Neurological: Negative for dizziness , positive for intermittent headache - when she does not sleep well..  No other specific complaints in a complete review of systems (except as listed in HPI above).  Objective  Vitals:   08/25/16 0919  BP: 130/74  Pulse: 67  Resp: 16  Temp: 97.9 F (36.6 C)  TempSrc: Oral  SpO2: 95%  Weight: 167 lb 6.4 oz (75.9 kg)  Height: _0  (1.651 m)    Body mass index is 27.86 kg/m.  Physical Exam  Constitutional: Patient appears well-developed and well-nourished. Obese  No distress.  HEENT: head atraumatic, normocephalic, pupils equal and reactive to light,  neck supple, throat within normal limits Cardiovascular: Normal rate, regular rhythm and normal heart sounds. 3/6 SEM. Trace  BLE edema ( ankle ) . Pulmonary/Chest: Effort normal, crackles on basis . No respiratory distress. Abdominal: Soft.  There is RUQ  Tenderness, no guarding or rebound Psychiatric: Patient has a normal mood and affect. behavior is normal. Judgment and thought content normal.   Recent Results (from the past 2160 hour(s))  CBC with Differential/Platelet     Status: Abnormal    Collection Time: 06/19/16 11:25 AM  Result Value Ref Range   WBC 4.7 3.6 - 11.0 K/uL   RBC 3.60 (L) 3.80 - 5.20 MIL/uL   Hemoglobin 11.1 (L) 12.0 - 16.0 g/dL   HCT 33.1 (L) 35.0 - 47.0 %   MCV 92.0 80.0 - 100.0 fL   MCH 30.8 26.0 - 34.0 pg   MCHC 33.5 32.0 - 36.0 g/dL   RDW 13.7 11.5 - 14.5 %   Platelets 128 (L) 150 - 440 K/uL   Neutrophils Relative % 62 %   Neutro Abs 2.9 1.4 - 6.5 K/uL   Lymphocytes Relative 27 %   Lymphs Abs 1.3 1.0 - 3.6 K/uL   Monocytes Relative 9 %   Monocytes Absolute 0.4 0.2 - 0.9 K/uL   Eosinophils Relative 1 %   Eosinophils Absolute 0.1 0 - 0.7 K/uL   Basophils Relative 1 %   Basophils Absolute 0.0 0 - 0.1 K/uL  Pathologist smear review     Status: None   Collection Time: 06/19/16 11:25 AM  Result Value Ref Range   Path Review      Peripheral smear review shows thrombocytopenia and normocytic anemia. WBC morphology is normal.  Dr. Luana Shu.  Comprehensive metabolic panel     Status: Abnormal   Collection Time: 06/19/16 11:25 AM  Result Value Ref Range   Sodium 137 135 - 145 mmol/L   Potassium 3.6 3.5 - 5.1 mmol/L   Chloride 104 101 - 111 mmol/L   CO2 27 22 - 32 mmol/L   Glucose, Bld 96 65 - 99 mg/dL   BUN 18 6 - 20 mg/dL   Creatinine, Ser 0.91 0.44 - 1.00 mg/dL   Calcium 9.2 8.9 - 10.3 mg/dL   Total Protein 7.1 6.5 - 8.1 g/dL   Albumin 4.1 3.5 - 5.0 g/dL   AST 25 15 - 41 U/L   ALT 15 14 - 54 U/L   Alkaline Phosphatase 64 38 - 126 U/L   Total Bilirubin 0.5 0.3 - 1.2 mg/dL   GFR calc non Af Amer 58 (L) >60 mL/min   GFR calc Af Amer >60 >60 mL/min    Comment: (NOTE) The eGFR has been calculated using the CKD EPI equation. This calculation has not been validated in all clinical  situations. eGFR's persistently <60 mL/min signify possible Chronic Kidney Disease.    Anion gap 6 5 - 15  Ferritin     Status: None   Collection Time: 06/19/16 11:25 AM  Result Value Ref Range   Ferritin 58 11 - 307 ng/mL  Folate     Status: None   Collection Time:  06/19/16 11:25 AM  Result Value Ref Range   Folate 32.0 >5.9 ng/mL  Iron and TIBC     Status: None   Collection Time: 06/19/16 11:25 AM  Result Value Ref Range   Iron 58 28 - 170 ug/dL   TIBC 267 250 - 450 ug/dL   Saturation Ratios 22 10.4 - 31.8 %   UIBC 209 ug/dL  TSH     Status: None   Collection Time: 06/19/16 11:25 AM  Result Value Ref Range   TSH 0.912 0.350 - 4.500 uIU/mL    Comment: Performed by a 3rd Generation assay with a functional sensitivity of <=0.01 uIU/mL.  Reticulocytes     Status: Abnormal   Collection Time: 06/19/16 11:25 AM  Result Value Ref Range   Retic Ct Pct 1.1 0.4 - 3.1 %   RBC. 3.68 (L) 3.80 - 5.20 MIL/uL   Retic Count, Manual 40.5 19.0 - 183.0 K/uL  Haptoglobin     Status: Abnormal   Collection Time: 06/19/16 11:25 AM  Result Value Ref Range   Haptoglobin <10 (L) 34 - 200 mg/dL    Comment: (NOTE) Performed At: [facility] [place], [place] [id number] Lindon Romp MD [id number]   Hepatitis C Antibody     Status: None   Collection Time: 06/19/16 11:25 AM  Result Value Ref Range   HCV Ab 0.2 0.0 - 0.9 s/co ratio    Comment: (NOTE)                                  Negative:     < 0.8                             Indeterminate: 0.8 - 0.9                                  Positive:     > 0.9 The CDC recommends that a positive HCV antibody result be followed up with a HCV Nucleic Acid Amplification test (528413). Performed At: [facility] [place], [place] [id number] Lindon Romp MD ZD:[id number]   Multiple Myeloma Panel (SPEP&IFE w/QIG)     Status: None   Collection Time: 06/19/16 11:26 AM  Result Value Ref Range   IgG (Immunoglobin G), Serum 1,523 700 - 1,600 mg/dL   IgA 170 64 - 422 mg/dL   IgM, Serum 34 26 - 217 mg/dL   Total Protein ELP 6.9 6.0 - 8.5 g/dL   Albumin SerPl Elph-Mcnc 3.9 2.9 - 4.4 g/dL   Alpha 1 0.2 0.0 - 0.4 g/dL   Alpha2 Glob SerPl Elph-Mcnc 0.5 0.4 - 1.0 g/dL    B-Globulin SerPl Elph-Mcnc 0.8 0.7 - 1.3 g/dL   Gamma Glob SerPl Elph-Mcnc 1.6 0.4 - 1.8 g/dL   M Protein SerPl Elph-Mcnc Not Observed Not Observed g/dL   Globulin, Total 3.0 2.2 - 3.9 g/dL   Albumin/Glob SerPl 1.4 0.7 - 1.7  IFE 1 Comment     Comment: An apparent normal immunofixation pattern.   Please Note Comment     Comment: (NOTE) Protein electrophoresis scan will follow via computer, mail, or courier delivery. Performed At: [facility] [address] [place], [place] [id number] Lindon Romp MD QQ:[id number]   H. pylori antigen, stool     Status: None   Collection Time: 06/23/16  9:00 AM  Result Value Ref Range   H. Pylori Stool Ag, Eia Negative Negative    Comment: (NOTE) Performed At: [facility] [place], [place] [id number] Lindon Romp MD [id number]       PHQ2/9: Depression screen [facility] 2/9 08/25/2016 05/26/2016 01/23/2016 08/17/2015 04/19/2015  Decreased Interest 0 0 0 0 0  Down, Depressed, Hopeless 0 0 0 0 0  PHQ - 2 Score 0 0 0 0 0    Fall Risk: Fall Risk  08/25/2016 05/26/2016 08/17/2015 04/19/2015 01/03/2015  Falls in the past year? Yes No No No Yes  Number falls in past yr: 1 - - - 1  Injury with Fall? No - - - No   Fell when leaving her church, while waving at a friend and not watching her step   Functional Status Survey: Is the patient deaf or have difficulty hearing?: No Does the patient have difficulty seeing, even when wearing glasses/contacts?: No Does the patient have difficulty concentrating, remembering, or making decisions?: No Does the patient have difficulty walking or climbing stairs?: No Does the patient have difficulty dressing or bathing?: No   Assessment & Plan  1. Dyslipidemia  - rosuvastatin (CRESTOR) 10 MG tablet; Take 0.5-1 tablets (5-10 mg total) by mouth daily. With Co-Q10  Dispense: 90 tablet; Refill: 1  2. B12 deficiency  - cyanocobalamin ((VITAMIN B-12)) injection 1,000 mcg; Inject 1 mL  (1,000 mcg total) into the muscle once.  3. Insomnia, persistent  - zolpidem (AMBIEN CR) 6.25 MG CR tablet; Take 1 tablet (6.25 mg total) by mouth at bedtime as needed for sleep.  Dispense: 30 tablet; Refill: 2  4. Benign essential HTN  - irbesartan (AVAPRO) 300 MG tablet; TAKE ONE TABLET BY MOUTH EVERY DAY FOR FOR BLOOD PRESSURE  Dispense: 90 tablet; Refill: 1 - carvedilol (COREG) 25 MG tablet; Take 1 tablet (25 mg total) by mouth 2 (two) times daily.  Dispense: 180 tablet; Refill: 1 - amLODipine (NORVASC) 10 MG tablet; Take 1 tablet (10 mg total) by mouth daily.  Dispense: 90 tablet; Refill: 1  5. Senile purpura ([place])  Reassurance, she also has thrombocytopenia but not spots on arms today   6. Mesenteric vascular insufficiency (HCC)  Continue medication management   7. Hypertensive pulmonary vascular disease ([place])  Continue follow up with Dr. Mina Marble  8. Hypertrophic cardiomyopathy (HCC)   9. Paroxysmal A-fib (HCC)  Continue Pradaxa  10. Right wrist pain  She needs refill of topical medication  - diclofenac sodium (VOLTAREN) 1 % GEL; Wrist twice daily  Dispense: 100 g; Refill: 0   11. RUQ pain  - US Abdomen Limited RUQ; Future

## 2016-08-27 ENCOUNTER — Telehealth: Payer: Self-pay

## 2016-08-27 NOTE — Telephone Encounter (Signed)
Patient was informed that she has been scheduled to have her [place] on 09/01/16 @ 9am at [facility].  Patient expressed verbal understanding.

## 2016-08-28 ENCOUNTER — Telehealth: Payer: Self-pay | Admitting: Family Medicine

## 2016-08-28 ENCOUNTER — Other Ambulatory Visit: Payer: Self-pay | Admitting: Family Medicine

## 2016-08-28 NOTE — Telephone Encounter (Signed)
Pt understands the Ambien is not going to be covered and is requesting to go up to 10 mg due to the cost.

## 2016-08-28 NOTE — Telephone Encounter (Signed)
Can you call pharmacy to find out the difference in price between Ambien 10 mg and DR 6.25?

## 2016-08-28 NOTE — Telephone Encounter (Signed)
6.25 mg for 30 day supply is $181 dollars and  Ambien 10 mg 30 day supply is $53.99 per pharmacist at [facility].

## 2016-08-29 MED ORDER — ZOLPIDEM TARTRATE 10 MG PO TABS: 10.0000 mg | ORAL_TABLET | Freq: Every evening | ORAL | 2 refills | Status: DC | PRN

## 2016-08-29 NOTE — Telephone Encounter (Signed)
changed

## 2016-08-29 NOTE — Telephone Encounter (Signed)
Patient notified that Ambien was changed due to cost and now at her pharmacy.

## 2016-09-01 ENCOUNTER — Ambulatory Visit
Admission: RE | Admit: 2016-09-01 | Discharge: 2016-09-01 | Disposition: A | Discharge status: Home / Self Care | Payer: Medicare Other | Source: Ambulatory Visit | Attending: Family Medicine | Admitting: Family Medicine

## 2016-09-01 DIAGNOSIS — R1011 Right upper quadrant pain: Secondary | ICD-10-CM | POA: Insufficient documentation

## 2016-09-18 DIAGNOSIS — K862 Cyst of pancreas: Secondary | ICD-10-CM | POA: Diagnosis not present

## 2016-09-18 DIAGNOSIS — I422 Other hypertrophic cardiomyopathy: Secondary | ICD-10-CM | POA: Diagnosis not present

## 2016-10-08 DIAGNOSIS — I340 Nonrheumatic mitral (valve) insufficiency: Secondary | ICD-10-CM | POA: Diagnosis not present

## 2016-10-08 DIAGNOSIS — I351 Nonrheumatic aortic (valve) insufficiency: Secondary | ICD-10-CM | POA: Diagnosis not present

## 2016-10-08 DIAGNOSIS — I361 Nonrheumatic tricuspid (valve) insufficiency: Secondary | ICD-10-CM | POA: Diagnosis not present

## 2016-10-08 DIAGNOSIS — I371 Nonrheumatic pulmonary valve insufficiency: Secondary | ICD-10-CM | POA: Diagnosis not present

## 2016-10-08 DIAGNOSIS — I313 Pericardial effusion (noninflammatory): Secondary | ICD-10-CM | POA: Diagnosis not present

## 2016-10-08 DIAGNOSIS — I083 Combined rheumatic disorders of mitral, aortic and tricuspid valves: Secondary | ICD-10-CM | POA: Diagnosis not present

## 2016-10-08 DIAGNOSIS — R931 Abnormal findings on diagnostic imaging of heart and coronary circulation: Secondary | ICD-10-CM | POA: Diagnosis not present

## 2016-10-08 DIAGNOSIS — K862 Cyst of pancreas: Secondary | ICD-10-CM | POA: Diagnosis not present

## 2016-10-08 DIAGNOSIS — I422 Other hypertrophic cardiomyopathy: Secondary | ICD-10-CM | POA: Diagnosis not present

## 2016-10-23 DIAGNOSIS — K862 Cyst of pancreas: Secondary | ICD-10-CM | POA: Diagnosis not present

## 2016-11-07 ENCOUNTER — Encounter: Payer: Self-pay | Admitting: Family Medicine

## 2016-11-11 ENCOUNTER — Inpatient Hospital Stay: Payer: Medicare Other | Admitting: Oncology

## 2016-11-11 ENCOUNTER — Inpatient Hospital Stay: Payer: Medicare Other

## 2016-11-20 ENCOUNTER — Other Ambulatory Visit: Payer: Self-pay | Admitting: Family Medicine

## 2016-11-20 DIAGNOSIS — E785 Hyperlipidemia, unspecified: Secondary | ICD-10-CM

## 2016-11-20 DIAGNOSIS — I10 Essential (primary) hypertension: Secondary | ICD-10-CM

## 2016-11-20 NOTE — Telephone Encounter (Signed)
Patient requesting refill of Crestor and Amlodipine to [facility].

## 2016-11-25 ENCOUNTER — Encounter: Payer: Self-pay | Admitting: Family Medicine

## 2016-11-25 ENCOUNTER — Ambulatory Visit (INDEPENDENT_AMBULATORY_CARE_PROVIDER_SITE_OTHER): Payer: Medicare Other | Admitting: Family Medicine

## 2016-11-25 VITALS — BP 132/78 | HR 60 | Temp 97.6°F | Resp 16 | Ht 65.0 in | Wt 167.4 lb

## 2016-11-25 DIAGNOSIS — I6523 Occlusion and stenosis of bilateral carotid arteries: Secondary | ICD-10-CM

## 2016-11-25 DIAGNOSIS — D696 Thrombocytopenia, unspecified: Secondary | ICD-10-CM

## 2016-11-25 DIAGNOSIS — E785 Hyperlipidemia, unspecified: Secondary | ICD-10-CM | POA: Diagnosis not present

## 2016-11-25 DIAGNOSIS — I10 Essential (primary) hypertension: Secondary | ICD-10-CM

## 2016-11-25 DIAGNOSIS — I422 Other hypertrophic cardiomyopathy: Secondary | ICD-10-CM | POA: Diagnosis not present

## 2016-11-25 DIAGNOSIS — G4700 Insomnia, unspecified: Secondary | ICD-10-CM

## 2016-11-25 DIAGNOSIS — K551 Chronic vascular disorders of intestine: Secondary | ICD-10-CM | POA: Diagnosis not present

## 2016-11-25 DIAGNOSIS — K862 Cyst of pancreas: Secondary | ICD-10-CM | POA: Diagnosis not present

## 2016-11-25 DIAGNOSIS — I480 Paroxysmal atrial fibrillation: Secondary | ICD-10-CM | POA: Diagnosis not present

## 2016-11-25 DIAGNOSIS — I272 Pulmonary hypertension, unspecified: Secondary | ICD-10-CM | POA: Diagnosis not present

## 2016-11-25 DIAGNOSIS — E538 Deficiency of other specified B group vitamins: Secondary | ICD-10-CM

## 2016-11-25 MED ORDER — CYANOCOBALAMIN 1000 MCG/ML IJ SOLN
1000.0000 ug | Freq: Once | INTRAMUSCULAR | Status: AC
  Administered 2016-11-25: 1000 ug via INTRAMUSCULAR

## 2016-11-25 MED ORDER — ZOLPIDEM TARTRATE 10 MG PO TABS: 10.0000 mg | ORAL_TABLET | Freq: Every evening | ORAL | 2 refills | Status: DC | PRN

## 2016-11-25 NOTE — Progress Notes (Signed)
Name: Sharon Bullock   MRN: [id number]    DOB: July 01, 1933   Date:11/25/2016       Progress Note  Subjective  Chief Complaint  Chief Complaint  Patient presents with  . dyslipidemia  . Insomnia  . Hypertension  . B12 Injection    HPI  HTN: bp is at goal today, no orthostatic changes, chest pain, SOB or palpitation. She has lower extremity edema - likely from Norvasc, that is stable, usually just ankles  Carotid atherosclerosis: found during MRI head done at [facility] at [facility] for evaluation of vertigo in 12/2015. On medical management at this time. She also has mesenteric insufficiency, she denies abdominal pain or blood in stools, no change in bowel movements  Perennial allergic rhinitis: she has post-nasal drainage, symptoms started 27years ago when she moved to [place], but used to be intermittent but now is daily. She states at times she has a hoarseness. No facial pain , no fever. She saw ENT, was told secondary to allergies was given nasal spray but still has daily symptoms.   B12 deficiency with a previous history of pernicious anemia: she is receiving monthly B12 shots and has been doing well, still has fatigue at times  Afib: she sees a cardiologist - Dr. Regino Schultze at [facility]. She has been on Pradaxa, she denies palpitation, she has some edema, she denies SOB  Insomnia: She tried Trazodone without help, only able to sleep one hour per night, she is back on  Ambien at lower dose of 5 mg but states only sleeping 2-3 hours , we went up on Ambien to 10 mg, but advised to try Ambien CR 6.25 but also discussed Seroquel , she does not want to try anything else, she wants to stay on Ambien 10 mg but is still only sleeping 2 hours per night, explained she should not take higher dose if the result is the same.  Hypertrophic cardiomyopathy and hypertensive pulmonary vascular disease: she sees Dr. Regino Schultze, no chest pain, SOB resolved, no paroxysmal nocturnal dyspnea, the bilateral swelling is back to  baseline. She is on ARB and Pradaxa for afib.   Dyslipidemia: she is on Crestor and CoQ 10 and muscle pain has improved  Thrombocytopenia: seeing hematologist - Dr. Smith Robert  , recent labs unremarkable, she was given reassurance by Dr Smith Robert and also has normocytic anemia, likely of chronic disease, platelets has been stablest.  RUQ pain: back in May 2018, [place] negative for gallbladder disease, she had CT done at [facility] and showed cyst on pancreas, she will follow up 6 months for MRI follow up  Patient Active Problem List   Diagnosis Date Noted  . Thrombocytopenia (HCC) 05/27/2016  . CHF (congestive heart failure) (HCC) 05/26/2016  . Mesenteric vascular insufficiency (HCC) 02/28/2016  . Pancreas cyst 02/28/2016  . Increased endometrial stripe thickness 02/28/2016  . Carotid atherosclerosis, bilateral 01/23/2016  . Hearing loss 08/17/2015  . Mitral regurgitation 04/19/2015  . Pernicious anemia 09/27/2014  . Paroxysmal A-fib (HCC) 09/27/2014  . Benign essential HTN 09/27/2014  . Perennial allergic rhinitis 09/27/2014  . Chronic anemia 09/27/2014  . Insomnia, persistent 09/27/2014  . Hypertensive pulmonary vascular disease (HCC) 09/27/2014  . B12 deficiency 09/27/2014  . Diverticulosis of colon 09/27/2014  . Dyslipidemia 09/27/2014  . Edema extremities 09/27/2014  . Gastric reflux 09/27/2014  . Bilateral hearing loss 09/27/2014  . Polypharmacy 09/27/2014  . Pelvic pain in female 09/27/2014  . MI (mitral incompetence) 09/27/2014  . Internal hemorrhoids 09/27/2014  . Osteopenia 09/27/2014  .  Arthralgia of shoulder 09/27/2014  . Finger tendinitis 09/27/2014  . Urge incontinence 09/27/2014  . Cervical prolapse 09/27/2014  . Vertigo 09/27/2014  . Vitamin D deficiency 09/27/2014  . Bladder cystocele 04/15/2012  . Asymmetric septal hypertrophy (HCC) 05/19/2011  . Hypertrophic cardiomyopathy (HCC) 05/19/2011    Past Surgical History:  Procedure Laterality Date  . BLADDER REPAIR  2014    tact    Family History  Problem Relation Age of Onset  . Alzheimer's disease Mother   . Hypertension Mother   . Stroke Father   . Heart disease Father   . Heart disease Brother   . Hypertension Brother   . Diabetes Brother   . Cancer Daughter        breast  . Hypertension Daughter   . Breast cancer Daughter     Social History   Social History  . Marital status: Married    Spouse name: N/A  . Number of children: N/A  . Years of education: N/A   Occupational History  . Not on file.   Social History Main Topics  . Smoking status: Never Smoker  . Smokeless tobacco: Never Used  . Alcohol use No  . Drug use: No  . Sexual activity: Not Currently   Other Topics Concern  . Not on file   Social History Narrative  . No narrative on file     Current Outpatient Prescriptions:  .  amiodarone (PACERONE) 200 MG tablet, Take 1 tablet by mouth daily., Disp: , Rfl:  .  amLODipine (NORVASC) 10 MG tablet, Take 1 tablet (10 mg total) by mouth daily., Disp: 90 tablet, Rfl: 1 .  azelastine (ASTELIN) 0.1 % nasal spray, Place 2 sprays into both nostrils 2 (two) times daily. Use in each nostril as directed, Disp: 30 mL, Rfl: 12 .  carvedilol (COREG) 25 MG tablet, Take 1 tablet (25 mg total) by mouth 2 (two) times daily., Disp: 180 tablet, Rfl: 1 .  cyanocobalamin 1000 MCG tablet, 1,000 mcg daily., Disp: , Rfl:  .  diclofenac sodium (VOLTAREN) 1 % GEL, Wrist twice daily, Disp: 100 g, Rfl: 0 .  folic acid (FOLVITE) 400 MCG tablet, Take 1 tablet by mouth daily. Reported on 04/19/2015, Disp: , Rfl:  .  irbesartan (AVAPRO) 300 MG tablet, TAKE ONE TABLET BY MOUTH EVERY DAY FOR FOR BLOOD PRESSURE, Disp: 90 tablet, Rfl: 1 .  omeprazole (PRILOSEC) 40 MG capsule, , Disp: , Rfl:  .  PRADAXA 150 MG CAPS capsule, Take 1 tablet by mouth 2 (two) times daily., Disp: , Rfl:  .  rosuvastatin (CRESTOR) 10 MG tablet, Take 0.5-1 tablets (5-10 mg total) by mouth daily. With Co-Q10, Disp: 90 tablet, Rfl: 1 .   Vitamin D, Cholecalciferol, 1000 UNITS TABS, Take 1 tablet by mouth daily., Disp: , Rfl:  .  zolpidem (AMBIEN) 10 MG tablet, Take 1 tablet (10 mg total) by mouth at bedtime as needed for sleep., Disp: 30 tablet, Rfl: 2  Current Facility-Administered Medications:  .  cyanocobalamin ((VITAMIN B-12)) injection 1,000 mcg, 1,000 mcg, Intramuscular, Once, Alba Cory, MD  Allergies  Allergen Reactions  . Nickel Dermatitis and Swelling  . Other Anaphylaxis    Uncoded Allergy. Allergen: SHELLFISH  . Ace Inhibitors Cough     ROS  Constitutional: Negative for fever or weight change.  Respiratory: Negative for cough , she has shortness of breath with activity.   Cardiovascular: Negative for chest pain or palpitations.  Gastrointestinal: Negative for abdominal pain, no bowel changes.  Musculoskeletal:  Negative for gait problem or joint swelling.  Skin: Negative for rash.  Neurological: Negative for dizziness or headache.  No other specific complaints in a complete review of systems (except as listed in HPI above).  Objective  Vitals:   11/25/16 0958  BP: 132/78  Pulse: 60  Resp: 16  Temp: 97.6 F (36.4 C)  SpO2: 97%  Weight: 167 lb 7 oz (75.9 kg)  Height: 5\' 5"  (1.651 m)    Body mass index is 27.86 kg/m.  Physical Exam  Constitutional: Patient appears well-developed and well-nourished. Obese No distress.  HEENT: head atraumatic, normocephalic, pupils equal and reactive to light, neck supple, throat within normal limits Cardiovascular: Normal rate, regular rhythm and normal heart sounds. 3/6 SEM. Trace  BLE edema ( ankle ) .Right foot more than left  Pulmonary/Chest: Effort normal, crackles on lung base. No respiratory distress. Abdominal: Soft. There is RUQ  Tenderness, no guarding or rebound Psychiatric: Patient has a normal mood and affect. behavior is normal. Judgment and thought content normal.  PHQ2/9: Depression screen [facility] 2/9 11/25/2016 08/25/2016 05/26/2016  01/23/2016 08/17/2015  Decreased Interest 0 0 0 0 0  Down, Depressed, Hopeless 0 0 0 0 0  PHQ - 2 Score 0 0 0 0 0     Fall Risk: Fall Risk  11/25/2016 08/25/2016 05/26/2016 08/17/2015 04/19/2015  Falls in the past year? Yes Yes No No No  Number falls in past yr: 1 1 - - -  Injury with Fall? No No - - -  Follow up Falls evaluation completed - - - -      Assessment & Plan  1. Pancreas cyst  Found on CT abdomen, will have repeat imaging , MRI Dec 2018 at [facility]   2. Paroxysmal A-fib (HCC)  She is doing well, no symptoms of SOB or palpitation, she is on pradaxa and denies easy bleeding  3. Thrombocytopenia (HCC)  Seeing Dr. Smith Robert that is monitoring labs  4. Asymmetric septal hypertrophy [facility])  Sees cardiologist   5. Hypertensive pulmonary vascular disease (HCC)  Sees Dr. Regino Schultze  6. Hypertrophic cardiomyopathy (HCC)  Continue follow up with Dr. Regino Schultze,   7. Mesenteric vascular insufficiency (HCC)  Found on CT abdomen done at [facility]. She is on statin and Pradaxa, no symptoms  8. Dyslipidemia  We will recheck it yearly, continue statin therapy   9. Benign essential HTN  Well controlled  10. Insomnia, persistent  - zolpidem (AMBIEN) 10 MG tablet; Take 1 tablet (10 mg total) by mouth at bedtime as needed for sleep.  Dispense: 30 tablet; Refill: 2  11. B12 deficiency  - cyanocobalamin ((VITAMIN B-12)) injection 1,000 mcg; Inject 1 mL (1,000 mcg total) into the muscle once.

## 2016-12-11 ENCOUNTER — Inpatient Hospital Stay: Payer: Medicare Other

## 2016-12-11 ENCOUNTER — Inpatient Hospital Stay: Payer: Medicare Other | Attending: Oncology | Admitting: Oncology

## 2016-12-11 VITALS — BP 154/71 | HR 62 | Temp 98.0°F | Resp 18 | Wt 166.6 lb

## 2016-12-11 DIAGNOSIS — I10 Essential (primary) hypertension: Secondary | ICD-10-CM | POA: Diagnosis not present

## 2016-12-11 DIAGNOSIS — I422 Other hypertrophic cardiomyopathy: Secondary | ICD-10-CM | POA: Insufficient documentation

## 2016-12-11 DIAGNOSIS — I739 Peripheral vascular disease, unspecified: Secondary | ICD-10-CM | POA: Insufficient documentation

## 2016-12-11 DIAGNOSIS — Z79899 Other long term (current) drug therapy: Secondary | ICD-10-CM | POA: Insufficient documentation

## 2016-12-11 DIAGNOSIS — Z7901 Long term (current) use of anticoagulants: Secondary | ICD-10-CM | POA: Diagnosis not present

## 2016-12-11 DIAGNOSIS — D696 Thrombocytopenia, unspecified: Secondary | ICD-10-CM | POA: Diagnosis not present

## 2016-12-11 DIAGNOSIS — D649 Anemia, unspecified: Secondary | ICD-10-CM | POA: Diagnosis not present

## 2016-12-11 DIAGNOSIS — E538 Deficiency of other specified B group vitamins: Secondary | ICD-10-CM | POA: Diagnosis not present

## 2016-12-11 DIAGNOSIS — J329 Chronic sinusitis, unspecified: Secondary | ICD-10-CM | POA: Insufficient documentation

## 2016-12-11 DIAGNOSIS — K219 Gastro-esophageal reflux disease without esophagitis: Secondary | ICD-10-CM | POA: Diagnosis not present

## 2016-12-11 DIAGNOSIS — R5383 Other fatigue: Secondary | ICD-10-CM | POA: Diagnosis not present

## 2016-12-11 DIAGNOSIS — R5381 Other malaise: Secondary | ICD-10-CM | POA: Diagnosis not present

## 2016-12-11 LAB — CBC
HCT: 32.9 % — ABNORMAL LOW (ref 35.0–47.0)
HEMOGLOBIN: 11.2 g/dL — AB (ref 12.0–16.0)
MCH: 31.1 pg (ref 26.0–34.0)
MCHC: 33.9 g/dL (ref 32.0–36.0)
MCV: 91.5 fL (ref 80.0–100.0)
Platelets: 175 10*3/uL (ref 150–440)
RBC: 3.59 MIL/uL — AB (ref 3.80–5.20)
RDW: 14.1 % (ref 11.5–14.5)
WBC: 6.2 10*3/uL (ref 3.6–11.0)

## 2016-12-11 NOTE — Progress Notes (Signed)
Hematology/Oncology Consult note [facility]  Telephone:([phone] [phone]  Patient Care Team: Steele Sizer, MD as PCP - General (Family Medicine)   Name of the patient: Sharon Bullock  [id number]  06-27-1933   Date of visit: 12/11/16  Diagnosis- 1. 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 Anemia, likely secondary to chronic disease  Chief complaint/ Reason for visit- routine follow-up  Heme/Onc history: patient is a 83 year old female with a past medical history significant for hypertension, allergic rhinitis B12 deficiency as well as hypertrophic cardiomyopathy and pulmonary vascular disease. Patient had a recent CBC which showed white count of 4.3, H&H of 10.7/33.5 and a platelet count of 139. She has been sent to [place] for evaluation and management of thrombocytopenia. Prior CBC from May 2017 showed white count of 5, H&H of 11.4/74.9 and a platelet count of 177. Looking at her prior counts from 2007 2017, a she has had normal platelet count of 04/15/2012. Between October and November 2017 patient's platelet count was 128, 116, 141 respectively. Her H&H during this time has been trending between 10.3-11.  Results of blood work from 06/19/2016 were as follows: CBC showed white count of 4.7, H&H of 11.1/33.4 and a platelet count of 128. Review of peripheral smear showed normocytic anemia and thrombocytopenia but no other significant findings. CMP was within normal limits. Ferritin was 58 and iron studies were within normal limits. Folate was within normal limits. TSH was normal. Reticulocyte count was normal at 1.1%. Haptoglobin was less than 10. Hep C antibody was negative. Multiple myeloma panel did not reveal any monoclonal protein. H pylori stool antigen was negative.  Results from 12/11/16 CBC showed H&H of 02/14/31.9 and a platelet count of 175.   Interval history- overall patient is doing well and only reports continued chronic sinusitis which  is managed by PCP. She denies abnormal bleeding, bruising, blood in stool, weakness, or fatigue.   ECOG PS- 1 Pain scale- 0 Opioid associated constipation- no  Review of systems- Review of Systems  Constitutional: Positive for malaise/fatigue. Negative for chills, fever and weight loss.  HENT: Positive for congestion and sore throat. Negative for ear discharge and nosebleeds.        Rhinorrhea  Eyes: Negative for blurred vision.  Respiratory: Negative for cough, hemoptysis, sputum production, shortness of breath and wheezing.   Cardiovascular: Negative for chest pain, palpitations, orthopnea and claudication.  Gastrointestinal: Negative for abdominal pain, blood in stool, constipation, diarrhea, heartburn, melena, nausea and vomiting.  Genitourinary: Negative for dysuria, flank pain, frequency, hematuria and urgency.  Musculoskeletal: Negative for back pain, joint pain and myalgias.  Skin: Negative for rash.  Neurological: Negative for dizziness, tingling, focal weakness, seizures, weakness and headaches.  Endo/Heme/Allergies: Does not bruise/bleed easily.  Psychiatric/Behavioral: Negative for depression and suicidal ideas. The patient does not have insomnia.      Current treatment- observation  Allergies  Allergen Reactions  . Nickel Dermatitis and Swelling  . Other Anaphylaxis    Uncoded Allergy. Allergen: SHELLFISH  . Ace Inhibitors Cough   Past Medical History:  Diagnosis Date  . Allergy   . Enlarged heart 2000  . GERD (gastroesophageal reflux disease)   . Hypertension   . Insomnia   . Vertigo 2006   Past Surgical History:  Procedure Laterality Date  . BLADDER REPAIR  2014   tact   Social History   Social History  . Marital status: Married    Spouse name: N/A  . Number of children: N/A  .  Years of education: N/A   Occupational History  . Not on file.   Social History Main Topics  . Smoking status: Never Smoker  . Smokeless tobacco: Never Used  . Alcohol  use No  . Drug use: No  . Sexual activity: Not Currently   Other Topics Concern  . Not on file   Social History Narrative  . No narrative on file   Family History  Problem Relation Age of Onset  . Alzheimer's disease Mother   . Hypertension Mother   . Stroke Father   . Heart disease Father   . Heart disease Brother   . Hypertension Brother   . Diabetes Brother   . Cancer Daughter        breast  . Hypertension Daughter   . Breast cancer Daughter     Current Outpatient Prescriptions:  .  amiodarone (PACERONE) 200 MG tablet, Take 1 tablet by mouth daily., Disp: , Rfl:  .  amLODipine (NORVASC) 10 MG tablet, Take 1 tablet (10 mg total) by mouth daily., Disp: 90 tablet, Rfl: 1 .  carvedilol (COREG) 25 MG tablet, Take 1 tablet (25 mg total) by mouth 2 (two) times daily., Disp: 180 tablet, Rfl: 1 .  cyanocobalamin 1000 MCG tablet, 1,000 mcg daily., Disp: , Rfl:  .  folic acid (FOLVITE) 353 MCG tablet, Take 1 tablet by mouth daily. Reported on 04/19/2015, Disp: , Rfl:  .  irbesartan (AVAPRO) 300 MG tablet, TAKE ONE TABLET BY MOUTH EVERY DAY FOR FOR BLOOD PRESSURE, Disp: 90 tablet, Rfl: 1 .  omeprazole (PRILOSEC) 40 MG capsule, , Disp: , Rfl:  .  PRADAXA 150 MG CAPS capsule, Take 1 tablet by mouth 2 (two) times daily., Disp: , Rfl:  .  rosuvastatin (CRESTOR) 10 MG tablet, Take 0.5-1 tablets (5-10 mg total) by mouth daily. With Co-Q10, Disp: 90 tablet, Rfl: 1 .  Vitamin D, Cholecalciferol, 1000 UNITS TABS, Take 1 tablet by mouth daily., Disp: , Rfl:  .  zolpidem (AMBIEN) 10 MG tablet, Take 1 tablet (10 mg total) by mouth at bedtime as needed for sleep., Disp: 30 tablet, Rfl: 2 .  azelastine (ASTELIN) 0.1 % nasal spray, Place 2 sprays into both nostrils 2 (two) times daily. Use in each nostril as directed (Patient not taking: Reported on 12/11/2016), Disp: 30 mL, Rfl: 12 .  diclofenac sodium (VOLTAREN) 1 % GEL, Wrist twice daily (Patient not taking: Reported on 12/11/2016), Disp: 100 g, Rfl:  0  Physical exam:  Vitals:   12/11/16 1108  BP: (!) 154/71  Pulse: 62  Resp: 18  Temp: 98 F (36.7 C)  TempSrc: Tympanic  Weight: 166 lb 9.6 oz (75.6 kg)   Physical Exam  Constitutional: She is oriented to person, place, and time and well-developed, well-nourished, and in no distress.  HENT:  Head: Normocephalic and atraumatic.  Eyes: Pupils are equal, round, and reactive to light. EOM are normal.  Neck: Normal range of motion.  Cardiovascular: Normal rate and regular rhythm.   Murmur (Grade 3 Systolic murmur heard loudest at Aortic area) heard. Pulmonary/Chest: Effort normal and breath sounds normal.  Abdominal: Soft. Bowel sounds are normal.  Neurological: She is alert and oriented to person, place, and time.  Skin: Skin is warm and dry.     CMP Latest Ref Rng & Units 06/19/2016  Glucose 65 - 99 mg/dL 96  BUN 6 - 20 mg/dL 18  Creatinine 0.44 - 1.00 mg/dL 0.91  Sodium 135 - 145 mmol/L 137  Potassium  3.5 - 5.1 mmol/L 3.6  Chloride 101 - 111 mmol/L 104  CO2 22 - 32 mmol/L 27  Calcium 8.9 - 10.3 mg/dL 9.2  Total Protein 6.5 - 8.1 g/dL 7.1  Total Bilirubin 0.3 - 1.2 mg/dL 0.5  Alkaline Phos 38 - 126 U/L 64  AST 15 - 41 U/L 25  ALT 14 - 54 U/L 15   CBC Latest Ref Rng & Units 12/11/2016  WBC 3.6 - 11.0 K/uL 6.2  Hemoglobin 12.0 - 16.0 g/dL 11.2(L)  Hematocrit 35.0 - 47.0 % 32.9(L)  Platelets 150 - 440 K/uL 175     Assessment and plan- Patient is a 83 y.o. female was referred to [place] for evaluation and management of thrombocytopenia  1. Overall patient's thrombocytopenia is mild and a platelet count is improved to 175 compared to previous baseline of 120s-130s. Patient does not have any signs and symptoms of bleeding. Hepatitis C and stool H. pylori antigen was negative. At this time I would watch her platelet counts conservatively without any further intervention at this time. Will see her back in 6 months with cbc with differential.   2. Normocytic anemia- I discussed the  results of the blood work with the patient which did not reveal any significant cause of her anemia. This could be secondary to chronic disease.    Visit Diagnosis 1. Thrombocytopenia ([place])   2. Chronic anemia     Beckey Rutter, NP 12/11/16 11:15AM  Dr. Randa Evens, MD, MPH [facility] Pager- [phone] 12/11/2016 11:33 AM

## 2016-12-11 NOTE — Progress Notes (Signed)
Here for follow up

## 2016-12-18 ENCOUNTER — Telehealth: Payer: Self-pay | Admitting: Family Medicine

## 2016-12-18 ENCOUNTER — Other Ambulatory Visit: Payer: Self-pay | Admitting: Family Medicine

## 2016-12-18 MED ORDER — FLUTICASONE PROPIONATE 50 MCG/ACT NA SUSP: 2.0000 | Freq: Every day | NASAL | 6 refills | Status: DC

## 2016-12-18 NOTE — Telephone Encounter (Signed)
Sent Flonase. Anti-histamines contra-indicated because of Amiodarone.  I cannot send benzonate for allergy related cough.

## 2016-12-18 NOTE — Telephone Encounter (Signed)
Please call and find out what medication. Nasal spray?

## 2016-12-18 NOTE — Telephone Encounter (Signed)
Requesting a return call. Requesting a refill on her allergy medication. Normally dr Carlynn Purl will give her something for her allergy and cough. She only takes this when allergy flares upPlease [facility] [address]

## 2016-12-18 NOTE — Telephone Encounter (Signed)
Patient states you gave her something prescription for allergies in the past. In her chart it looks as Dr. Carlynn Purl prescribed Claritin in the past. If your unable to prescribe could you please recommend a good otc Allergy medication. Also patient would like a refill of Benzonatate for her cough. Thanks.

## 2016-12-19 NOTE — Telephone Encounter (Signed)
Left message for patient

## 2016-12-29 ENCOUNTER — Other Ambulatory Visit: Payer: Self-pay | Admitting: Family Medicine

## 2016-12-29 DIAGNOSIS — I10 Essential (primary) hypertension: Secondary | ICD-10-CM

## 2016-12-29 NOTE — Telephone Encounter (Signed)
Patient requesting refill of Carvedilol to [facility].  

## 2017-01-01 ENCOUNTER — Ambulatory Visit (INDEPENDENT_AMBULATORY_CARE_PROVIDER_SITE_OTHER): Payer: Medicare Other

## 2017-01-01 DIAGNOSIS — E538 Deficiency of other specified B group vitamins: Secondary | ICD-10-CM

## 2017-01-01 DIAGNOSIS — Z23 Encounter for immunization: Secondary | ICD-10-CM | POA: Diagnosis not present

## 2017-01-01 MED ORDER — CYANOCOBALAMIN 1000 MCG/ML IJ SOLN
1000.0000 ug | INTRAMUSCULAR | Status: DC
  Administered 2017-01-01: 1000 ug via INTRAMUSCULAR

## 2017-01-31 ENCOUNTER — Other Ambulatory Visit: Payer: Self-pay | Admitting: Family Medicine

## 2017-01-31 DIAGNOSIS — G4700 Insomnia, unspecified: Secondary | ICD-10-CM

## 2017-02-26 ENCOUNTER — Ambulatory Visit: Payer: Medicare Other | Admitting: Family Medicine

## 2017-03-01 DIAGNOSIS — S0990XA Unspecified injury of head, initial encounter: Secondary | ICD-10-CM | POA: Diagnosis not present

## 2017-03-01 DIAGNOSIS — W228XXA Striking against or struck by other objects, initial encounter: Secondary | ICD-10-CM | POA: Diagnosis not present

## 2017-03-01 DIAGNOSIS — R200 Anesthesia of skin: Secondary | ICD-10-CM | POA: Diagnosis not present

## 2017-03-01 DIAGNOSIS — M26621 Arthralgia of right temporomandibular joint: Secondary | ICD-10-CM | POA: Diagnosis not present

## 2017-03-01 DIAGNOSIS — R011 Cardiac murmur, unspecified: Secondary | ICD-10-CM | POA: Diagnosis not present

## 2017-03-01 DIAGNOSIS — R51 Headache: Secondary | ICD-10-CM | POA: Diagnosis not present

## 2017-03-01 DIAGNOSIS — S0993XA Unspecified injury of face, initial encounter: Secondary | ICD-10-CM | POA: Diagnosis not present

## 2017-03-01 DIAGNOSIS — R6884 Jaw pain: Secondary | ICD-10-CM | POA: Diagnosis not present

## 2017-03-01 DIAGNOSIS — I10 Essential (primary) hypertension: Secondary | ICD-10-CM | POA: Diagnosis not present

## 2017-03-01 DIAGNOSIS — R202 Paresthesia of skin: Secondary | ICD-10-CM | POA: Diagnosis not present

## 2017-03-01 DIAGNOSIS — M542 Cervicalgia: Secondary | ICD-10-CM | POA: Diagnosis not present

## 2017-03-01 DIAGNOSIS — Y33XXXA Other specified events, undetermined intent, initial encounter: Secondary | ICD-10-CM | POA: Diagnosis not present

## 2017-03-02 ENCOUNTER — Telehealth: Payer: Self-pay | Admitting: Family Medicine

## 2017-03-02 DIAGNOSIS — G4700 Insomnia, unspecified: Secondary | ICD-10-CM

## 2017-03-03 NOTE — Telephone Encounter (Signed)
Pt called the pharmacy and they do not have the script. Please advise & call patient. She said she really needs the script,please.

## 2017-03-04 NOTE — Telephone Encounter (Signed)
Pt is scheduled °

## 2017-03-04 NOTE — Telephone Encounter (Signed)
Medication was refused because Dr. Carlynn Purl wants her to come in to be seen for appointment for controlled substances. Please call and schedule appointment.

## 2017-03-06 ENCOUNTER — Encounter: Payer: Self-pay | Admitting: Family Medicine

## 2017-03-06 ENCOUNTER — Ambulatory Visit (INDEPENDENT_AMBULATORY_CARE_PROVIDER_SITE_OTHER): Payer: Medicare Other | Admitting: Family Medicine

## 2017-03-06 VITALS — BP 130/82 | HR 68 | Temp 97.9°F | Resp 16 | Ht 65.0 in | Wt 170.9 lb

## 2017-03-06 DIAGNOSIS — K551 Chronic vascular disorders of intestine: Secondary | ICD-10-CM

## 2017-03-06 DIAGNOSIS — I480 Paroxysmal atrial fibrillation: Secondary | ICD-10-CM

## 2017-03-06 DIAGNOSIS — I10 Essential (primary) hypertension: Secondary | ICD-10-CM | POA: Diagnosis not present

## 2017-03-06 DIAGNOSIS — E538 Deficiency of other specified B group vitamins: Secondary | ICD-10-CM

## 2017-03-06 DIAGNOSIS — I272 Pulmonary hypertension, unspecified: Secondary | ICD-10-CM | POA: Diagnosis not present

## 2017-03-06 DIAGNOSIS — I422 Other hypertrophic cardiomyopathy: Secondary | ICD-10-CM | POA: Diagnosis not present

## 2017-03-06 DIAGNOSIS — I6523 Occlusion and stenosis of bilateral carotid arteries: Secondary | ICD-10-CM

## 2017-03-06 DIAGNOSIS — G4700 Insomnia, unspecified: Secondary | ICD-10-CM

## 2017-03-06 DIAGNOSIS — E876 Hypokalemia: Secondary | ICD-10-CM

## 2017-03-06 DIAGNOSIS — D696 Thrombocytopenia, unspecified: Secondary | ICD-10-CM

## 2017-03-06 DIAGNOSIS — E785 Hyperlipidemia, unspecified: Secondary | ICD-10-CM | POA: Diagnosis not present

## 2017-03-06 MED ORDER — ROSUVASTATIN CALCIUM 10 MG PO TABS: 10.0000 mg | ORAL_TABLET | Freq: Every day | ORAL | 1 refills | Status: DC

## 2017-03-06 MED ORDER — AMLODIPINE BESYLATE 10 MG PO TABS: 10.0000 mg | ORAL_TABLET | Freq: Every day | ORAL | 1 refills | Status: DC

## 2017-03-06 MED ORDER — IRBESARTAN 300 MG PO TABS: ORAL_TABLET | ORAL | 1 refills | Status: DC

## 2017-03-06 MED ORDER — ZOLPIDEM TARTRATE 10 MG PO TABS: 10.0000 mg | ORAL_TABLET | Freq: Every evening | ORAL | 2 refills | Status: DC | PRN

## 2017-03-06 MED ORDER — CYANOCOBALAMIN 1000 MCG/ML IJ SOLN
1000.0000 ug | Freq: Once | INTRAMUSCULAR | Status: AC
  Administered 2017-03-06: 1000 ug via INTRAMUSCULAR

## 2017-03-06 NOTE — Progress Notes (Signed)
Name: Sharon Bullock   MRN: [id number]    DOB: 1933/05/24   Date:03/06/2017       Progress Note  Subjective  Chief Complaint  Chief Complaint  Patient presents with  . Follow-up    medication refills  . Insomnia    HPI   HTN: bp is at goal today, no orthostatic changes, chest pain, SOB or palpitation. Not currently having lower extremity edema  Carotid atherosclerosis: found during MRI head done at [facility] for evaluation of vertigo in 12/2015. On medical management at this time. She also has mesenteric insufficiency, she denies abdominal pain or blood in stools, no change in bowel movements  Perennial allergic rhinitis: she has post-nasal drainage, symptoms started 27years ago when she moved to [place], but used to be intermittent but now is daily. She states at times she has a hoarseness. No facial pain , no fever. She saw ENT, was told secondary to allergies was given nasal spray but still has daily symptoms.  She has follow up with ENT coming up soon.  B12 deficiency with a previous history of pernicious anemia: she is receiving monthly B12 shots and has been doing well, still has fatigue at times  Afib: she sees a cardiologist - Dr. Regino Schultze at [facility]. She has been on Pradaxa, she denies palpitation, she has some edema, she denies SOB  Insomnia: She tried Trazodone without help, only able to sleep one hour per night, she is back on Ambien at lower dose of 5 mg but states only sleeping 2-3 hours , we went up on Ambien to 10 mg, but advised to try Ambien CR 6.25 but also discussed Seroquel , she does not want to try anything else, she wants to stay on Ambien 10 mg but is still only sleeping 2 hours per night, explained she should not take higher dose if the result is the same. She states has nocturia, she states it is chronic and will discuss it with gyn, she does not want to give urine specimen or referral to Urologist at this time  Hypertrophic cardiomyopathy and hypertensive pulmonary  vascular disease: she sees Dr. Regino Schultze, no chest pain, SOB or paroxysmal nocturnal dyspnea, the bilateral swelling is back to baseline. She is on ARB and Pradaxa for afib.   Dyslipidemia: she is on Crestor and CoQ 10 and muscle pain has improved, due for labs next visit   Thrombocytopenia: seeing hematologist - Dr. Smith Robert 06/2016 , recent labs stable - done at [facility],  she was given reassurance by Dr Smith Robert and also has normocytic anemia, likely of chronic disease, platelets has been stabled.  Head injury: it happened a couple of weeks ago, went to [facility] last week, had a negative CT for bleeding or fractures, she states pain now is mild to moderate and improving. Flat of ladder fell on her right temporal area, initially had a bruise, and facial swelling, but that has also improved, while at [facility] labs showed low potassium she has increased dietary potassium intake and taking some drink given by [facility] but not sure of the name. We will recheck labs.   Patient Active Problem List   Diagnosis Date Noted  . Thrombocytopenia (HCC) 05/27/2016  . CHF (congestive heart failure) (HCC) 05/26/2016  . Mesenteric vascular insufficiency (HCC) 02/28/2016  . Pancreas cyst 02/28/2016  . Increased endometrial stripe thickness 02/28/2016  . Carotid atherosclerosis, bilateral 01/23/2016  . Hearing loss 08/17/2015  . Mitral regurgitation 04/19/2015  . Pernicious anemia 09/27/2014  .  Paroxysmal A-fib (HCC) 09/27/2014  . Benign essential HTN 09/27/2014  . Perennial allergic rhinitis 09/27/2014  . Chronic anemia 09/27/2014  . Insomnia, persistent 09/27/2014  . Hypertensive pulmonary vascular disease (HCC) 09/27/2014  . B12 deficiency 09/27/2014  . Diverticulosis of colon 09/27/2014  . Dyslipidemia 09/27/2014  . Edema extremities 09/27/2014  . Gastric reflux 09/27/2014  . Bilateral hearing loss 09/27/2014  . Polypharmacy 09/27/2014  . Pelvic pain in female 09/27/2014  . MI (mitral incompetence) 09/27/2014  . Internal  hemorrhoids 09/27/2014  . Osteopenia 09/27/2014  . Arthralgia of shoulder 09/27/2014  . Finger tendinitis 09/27/2014  . Urge incontinence 09/27/2014  . Cervical prolapse 09/27/2014  . Vertigo 09/27/2014  . Vitamin D deficiency 09/27/2014  . Bladder cystocele 04/15/2012  . Asymmetric septal hypertrophy (HCC) 05/19/2011  . Hypertrophic cardiomyopathy (HCC) 05/19/2011    Past Surgical History:  Procedure Laterality Date  . BLADDER REPAIR  2014   tact    Family History  Problem Relation Age of Onset  . Alzheimer's disease Mother   . Hypertension Mother   . Stroke Father   . Heart disease Father   . Heart disease Brother   . Hypertension Brother   . Diabetes Brother   . Cancer Daughter        breast  . Hypertension Daughter   . Breast cancer Daughter     Social History   Socioeconomic History  . Marital status: Married    Spouse name: Not on file  . Number of children: Not on file  . Years of education: Not on file  . Highest education level: Not on file  Social Needs  . Financial resource strain: Not on file  . Food insecurity - worry: Not on file  . Food insecurity - inability: Not on file  . Transportation needs - medical: Not on file  . Transportation needs - non-medical: Not on file  Occupational History  . Not on file  Tobacco Use  . Smoking status: Never Smoker  . Smokeless tobacco: Never Used  Substance and Sexual Activity  . Alcohol use: No    Alcohol/week: 0.0 oz  . Drug use: No  . Sexual activity: Not Currently  Other Topics Concern  . Not on file  Social History Narrative  . Not on file     Current Outpatient Medications:  .  amiodarone (PACERONE) 200 MG tablet, Take 1 tablet by mouth daily., Disp: , Rfl:  .  amLODipine (NORVASC) 10 MG tablet, Take 1 tablet (10 mg total) by mouth daily., Disp: 90 tablet, Rfl: 1 .  azelastine (ASTELIN) 0.1 % nasal spray, Place 2 sprays into both nostrils 2 (two) times daily. Use in each nostril as directed,  Disp: 30 mL, Rfl: 12 .  carvedilol (COREG) 25 MG tablet, TAKE 1 TABLET(25 MG) BY MOUTH TWICE DAILY, Disp: 180 tablet, Rfl: 0 .  cyanocobalamin 1000 MCG tablet, 1,000 mcg daily., Disp: , Rfl:  .  diclofenac sodium (VOLTAREN) 1 % GEL, Wrist twice daily, Disp: 100 g, Rfl: 0 .  fluticasone (FLONASE) 50 MCG/ACT nasal spray, Place 2 sprays into both nostrils daily., Disp: 16 g, Rfl: 6 .  folic acid (FOLVITE) 400 MCG tablet, Take 1 tablet by mouth daily. Reported on 04/19/2015, Disp: , Rfl:  .  irbesartan (AVAPRO) 300 MG tablet, TAKE ONE TABLET BY MOUTH EVERY DAY FOR FOR BLOOD PRESSURE, Disp: 90 tablet, Rfl: 1 .  omeprazole (PRILOSEC) 40 MG capsule, , Disp: , Rfl:  .  PRADAXA 150 MG CAPS  capsule, Take 1 tablet by mouth 2 (two) times daily., Disp: , Rfl:  .  rosuvastatin (CRESTOR) 10 MG tablet, Take 1 tablet (10 mg total) by mouth daily. With Co-Q10, Disp: 90 tablet, Rfl: 1 .  Vitamin D, Cholecalciferol, 1000 UNITS TABS, Take 1 tablet by mouth daily., Disp: , Rfl:  .  zolpidem (AMBIEN) 10 MG tablet, Take 1 tablet (10 mg total) by mouth at bedtime as needed for sleep., Disp: 30 tablet, Rfl: 2  Current Facility-Administered Medications:  .  cyanocobalamin ((VITAMIN B-12)) injection 1,000 mcg, 1,000 mcg, Intramuscular, Once, Alba Cory, MD  Allergies  Allergen Reactions  . Nickel Dermatitis and Swelling  . Other Anaphylaxis    Uncoded Allergy. Allergen: SHELLFISH  . Ace Inhibitors Cough     ROS  Constitutional: Negative for fever or weight change.  Respiratory: Negative for cough and shortness of breath.   Cardiovascular: Negative for chest pain or palpitations.  Gastrointestinal: Negative for abdominal pain, no bowel changes.  Musculoskeletal: Negative for gait problem or joint swelling.  Skin: Negative for rash.  Neurological: Negative for dizziness , positive for right  Headache ( from recent injury) .  No other specific complaints in a complete review of systems (except as listed in HPI  above).   Objective  Vitals:   03/06/17 0959  BP: 130/82  Pulse: 68  Resp: 16  Temp: 97.9 F (36.6 C)  TempSrc: Oral  SpO2: 97%  Weight: 170 lb 14.4 oz (77.5 kg)  Height: 5\' 5"  (1.651 m)    Body mass index is 28.44 kg/m.  Physical Exam  Constitutional: Patient appears well-developed and well-nourished. Obese  No distress.  HEENT: head atraumatic, normocephalic, pupils equal and reactive to light,  neck supple, throat within normal limits, no bruises on face, tender to palpation of right side of head ( recent injury ) but improving.  Cardiovascular: Normal rate, regular rhythm and normal heart sounds.  SEM 3/6  No BLE edema. Pulmonary/Chest: Effort normal and breath sounds normal. No respiratory distress. Abdominal: Soft.  There is no tenderness. Psychiatric: Patient has a normal mood and affect. behavior is normal. Judgment and thought content normal. Neurological: no focal findings.   Recent Results (from the past 2160 hour(s))  CBC     Status: Abnormal   Collection Time: 12/11/16 10:48 AM  Result Value Ref Range   WBC 6.2 3.6 - 11.0 K/uL   RBC 3.59 (L) 3.80 - 5.20 MIL/uL   Hemoglobin 11.2 (L) 12.0 - 16.0 g/dL   HCT 40.9 (L) 81.1 - 91.4 %   MCV 91.5 80.0 - 100.0 fL   MCH 31.1 26.0 - 34.0 pg   MCHC 33.9 32.0 - 36.0 g/dL   RDW 78.2 95.6 - 21.3 %   Platelets 175 150 - 440 K/uL     PHQ2/9: Depression screen [facility] 2/9 11/25/2016 08/25/2016 05/26/2016 01/23/2016 08/17/2015  Decreased Interest 0 0 0 0 0  Down, Depressed, Hopeless 0 0 0 0 0  PHQ - 2 Score 0 0 0 0 0     Fall Risk: Fall Risk  11/25/2016 08/25/2016 05/26/2016 08/17/2015 04/19/2015  Falls in the past year? Yes Yes No No No  Number falls in past yr: 1 1 - - -  Injury with Fall? No No - - -  Follow up Falls evaluation completed - - - -     Assessment & Plan  1. Benign essential HTN  - amLODipine (NORVASC) 10 MG tablet; Take 1 tablet (10 mg total) by mouth  daily.  Dispense: 90 tablet; Refill: 1 - irbesartan  (AVAPRO) 300 MG tablet; TAKE ONE TABLET BY MOUTH EVERY DAY FOR FOR BLOOD PRESSURE  Dispense: 90 tablet; Refill: 1  2. Insomnia, persistent  - zolpidem (AMBIEN) 10 MG tablet; Take 1 tablet (10 mg total) by mouth at bedtime as needed for sleep.  Dispense: 30 tablet; Refill: 2  3. Dyslipidemia  - rosuvastatin (CRESTOR) 10 MG tablet; Take 1 tablet (10 mg total) by mouth daily. With Co-Q10  Dispense: 90 tablet; Refill: 1  4. Paroxysmal A-fib (HCC)  Sees Dr. Regino Schultze, rhythm controlled  5. B12 deficiency  - cyanocobalamin ((VITAMIN B-12)) injection 1,000 mcg  6. Asymmetric septal hypertrophy (HCC)  Continue follow up with cardiologist   7. Hypertensive pulmonary vascular disease (HCC)   8. Mesenteric vascular insufficiency (HCC)  Denies abdominal pain   9. Thrombocytopenia (HCC  Seen by Dr. Smith Robert, advised to monitor, recent labs done at [facility] showed stable anemia and thrombocytopenia   10. Hypokalemia  - Potassium - Magnesium

## 2017-03-09 DIAGNOSIS — E876 Hypokalemia: Secondary | ICD-10-CM | POA: Diagnosis not present

## 2017-03-10 LAB — POTASSIUM: Potassium: 4.1 mmol/L (ref 3.5–5.3)

## 2017-03-10 LAB — MAGNESIUM: MAGNESIUM: 2.2 mg/dL (ref 1.5–2.5)

## 2017-04-04 ENCOUNTER — Other Ambulatory Visit: Payer: Self-pay | Admitting: Family Medicine

## 2017-04-04 DIAGNOSIS — I10 Essential (primary) hypertension: Secondary | ICD-10-CM

## 2017-04-06 NOTE — Telephone Encounter (Signed)
Hypertension medication request: Coreg to [facility].   Last office visit pertaining to hypertension: 03/06/2017  BP Readings from Last 3 Encounters:  03/06/17 130/82  12/11/16 (!) 154/71  11/25/16 132/78    Lab Results  Component Value Date   CREATININE 0.91 06/19/2016   BUN 18 06/19/2016   NA 137 06/19/2016   K 4.1 03/09/2017   CL 104 06/19/2016   CO2 27 06/19/2016    No follow up scheduled.

## 2017-04-23 DIAGNOSIS — K862 Cyst of pancreas: Secondary | ICD-10-CM | POA: Diagnosis not present

## 2017-04-29 ENCOUNTER — Ambulatory Visit (INDEPENDENT_AMBULATORY_CARE_PROVIDER_SITE_OTHER): Payer: Medicare Other

## 2017-04-29 DIAGNOSIS — E538 Deficiency of other specified B group vitamins: Secondary | ICD-10-CM | POA: Diagnosis not present

## 2017-04-29 MED ORDER — CYANOCOBALAMIN 1000 MCG/ML IJ SOLN
1000.0000 ug | Freq: Once | INTRAMUSCULAR | Status: AC
  Administered 2017-04-29: 1000 ug via INTRAMUSCULAR

## 2017-04-29 NOTE — Progress Notes (Signed)
Patient had b-12 injection in left deltoid. She tolerated injection well. NKDA to medication. She will return in 30 days for next injection.

## 2017-04-30 DIAGNOSIS — K862 Cyst of pancreas: Secondary | ICD-10-CM | POA: Diagnosis not present

## 2017-05-27 ENCOUNTER — Ambulatory Visit: Payer: Medicare Other

## 2017-06-02 ENCOUNTER — Other Ambulatory Visit: Payer: Self-pay | Admitting: Family Medicine

## 2017-06-02 DIAGNOSIS — G4700 Insomnia, unspecified: Secondary | ICD-10-CM

## 2017-06-02 NOTE — Telephone Encounter (Signed)
Refill request for general medication: Ambien to [facility].   Last office visit: 03/06/2017   No Follow-up on file.

## 2017-06-11 ENCOUNTER — Encounter: Payer: Self-pay | Admitting: Oncology

## 2017-06-11 ENCOUNTER — Inpatient Hospital Stay: Payer: Medicare Other

## 2017-06-11 ENCOUNTER — Inpatient Hospital Stay: Payer: Medicare Other | Attending: Oncology | Admitting: Oncology

## 2017-06-11 VITALS — BP 142/82 | HR 54 | Temp 97.4°F | Resp 14 | Wt 167.0 lb

## 2017-06-11 DIAGNOSIS — G4700 Insomnia, unspecified: Secondary | ICD-10-CM | POA: Diagnosis not present

## 2017-06-11 DIAGNOSIS — D649 Anemia, unspecified: Secondary | ICD-10-CM

## 2017-06-11 DIAGNOSIS — I10 Essential (primary) hypertension: Secondary | ICD-10-CM | POA: Insufficient documentation

## 2017-06-11 DIAGNOSIS — D696 Thrombocytopenia, unspecified: Secondary | ICD-10-CM | POA: Diagnosis not present

## 2017-06-11 DIAGNOSIS — E538 Deficiency of other specified B group vitamins: Secondary | ICD-10-CM | POA: Diagnosis not present

## 2017-06-11 DIAGNOSIS — W19XXXA Unspecified fall, initial encounter: Secondary | ICD-10-CM | POA: Insufficient documentation

## 2017-06-11 DIAGNOSIS — M25551 Pain in right hip: Secondary | ICD-10-CM | POA: Insufficient documentation

## 2017-06-11 DIAGNOSIS — Z803 Family history of malignant neoplasm of breast: Secondary | ICD-10-CM

## 2017-06-11 DIAGNOSIS — I998 Other disorder of circulatory system: Secondary | ICD-10-CM | POA: Insufficient documentation

## 2017-06-11 DIAGNOSIS — K219 Gastro-esophageal reflux disease without esophagitis: Secondary | ICD-10-CM | POA: Insufficient documentation

## 2017-06-11 DIAGNOSIS — Z79899 Other long term (current) drug therapy: Secondary | ICD-10-CM | POA: Diagnosis not present

## 2017-06-11 DIAGNOSIS — I422 Other hypertrophic cardiomyopathy: Secondary | ICD-10-CM | POA: Insufficient documentation

## 2017-06-11 DIAGNOSIS — J309 Allergic rhinitis, unspecified: Secondary | ICD-10-CM | POA: Insufficient documentation

## 2017-06-11 LAB — CBC WITH DIFFERENTIAL/PLATELET
Basophils Absolute: 0 10*3/uL (ref 0–0.1)
Basophils Relative: 1 %
Eosinophils Absolute: 0.1 10*3/uL (ref 0–0.7)
Eosinophils Relative: 2 %
HCT: 31.8 % — ABNORMAL LOW (ref 35.0–47.0)
Hemoglobin: 10.7 g/dL — ABNORMAL LOW (ref 12.0–16.0)
LYMPHS ABS: 1.6 10*3/uL (ref 1.0–3.6)
LYMPHS PCT: 29 %
MCH: 30.8 pg (ref 26.0–34.0)
MCHC: 33.7 g/dL (ref 32.0–36.0)
MCV: 91.4 fL (ref 80.0–100.0)
MONO ABS: 0.5 10*3/uL (ref 0.2–0.9)
Monocytes Relative: 9 %
Neutro Abs: 3.1 10*3/uL (ref 1.4–6.5)
Neutrophils Relative %: 59 %
PLATELETS: 148 10*3/uL — AB (ref 150–440)
RBC: 3.48 MIL/uL — AB (ref 3.80–5.20)
RDW: 13.6 % (ref 11.5–14.5)
WBC: 5.3 10*3/uL (ref 3.6–11.0)

## 2017-06-12 NOTE — Progress Notes (Signed)
Hematology/Oncology Consult note [facility]  [phone] [phone]  Patient Care Team: Steele Sizer, MD as PCP - General (Family Medicine)   Name of the patient: Sharon Bullock  [id number]  20-Sep-1933   Date of visit: 06/12/17  Diagnosis- 1. Anemia possibly anemia of chronic disease versus MDS 2. Mild thrombocytopenia- ITP versus MDS  Chief complaint/ Reason for visit- routine f/u of anemia and thrombocytopenia  Heme/Onc history: patient is a 84 year old female with a past medical history significant for hypertension, allergic rhinitis B12 deficiency as well as hypertrophic cardiomyopathy and pulmonary vascular disease. Patient had a recent CBC which showed white count of 4.3, H&H of 10.7/33.5 and a platelet count of 139. She has been sent to [place] for evaluation and management of thrombocytopenia. Prior CBC from May 2017 showed white count of 5, H&H of 11.4/74.9 and a platelet count of 177. Looking at her prior counts from 2007 2017, a she has had normal platelet count of 04/15/2012. Between October and November 2017 patient's platelet count was 128, 116, 141 respectively. Her H&H during this time has been trending between 10.3-11.  Results of blood work from 06/19/2016 were as follows: CBC showed white count of 4.7, H&H of 11.1/33.4 and a platelet count of 128. Review of peripheral smear showed normocytic anemia and thrombocytopenia but no other significant findings. CMP was within normal limits. Ferritin was 58 and iron studies were within normal limits. Folate was within normal limits. TSH was normal. Reticulocyte count was normal at 1.1%. Haptoglobin was less than 10. Hep C antibody was negative. Multiple myeloma panel did not reveal any monoclonal protein. H pylori stool antigen was negative.   Interval history- Patient had a fall 2 weeks ago when she was trying to mop the floor using "mop slippers". She reports mild pain in her right hip  but is able to ambulate  ECOG PS- 1 Pain scale- 0  Review of systems- Review of Systems  Constitutional: Positive for malaise/fatigue. Negative for chills, fever and weight loss.  HENT: Negative for congestion, ear discharge and nosebleeds.   Eyes: Negative for blurred vision.  Respiratory: Negative for cough, hemoptysis, sputum production, shortness of breath and wheezing.   Cardiovascular: Negative for chest pain, palpitations, orthopnea and claudication.  Gastrointestinal: Negative for abdominal pain, blood in stool, constipation, diarrhea, heartburn, melena, nausea and vomiting.  Genitourinary: Negative for dysuria, flank pain, frequency, hematuria and urgency.  Musculoskeletal: Negative for back pain, joint pain and myalgias.  Skin: Negative for rash.  Neurological: Negative for dizziness, tingling, focal weakness, seizures, weakness and headaches.  Endo/Heme/Allergies: Does not bruise/bleed easily.  Psychiatric/Behavioral: Negative for depression and suicidal ideas. The patient does not have insomnia.       Allergies  Allergen Reactions  . Nickel Dermatitis and Swelling  . Other Anaphylaxis    Uncoded Allergy. Allergen: SHELLFISH  . Ace Inhibitors Cough     Past Medical History:  Diagnosis Date  . Allergy   . Enlarged heart 2000  . GERD (gastroesophageal reflux disease)   . Hypertension   . Insomnia   . Vertigo 2006     Past Surgical History:  Procedure Laterality Date  . BLADDER REPAIR  2014   tact    Social History   Socioeconomic History  . Marital status: Married    Spouse name: Not on file  . Number of children: Not on file  . Years of education: Not on file  . Highest education level: Not on file  Social Needs  . Financial resource strain: Not on file  . Food insecurity - worry: Not on file  . Food insecurity - inability: Not on file  . Transportation needs - medical: Not on file  . Transportation needs - non-medical: Not on file  Occupational  History  . Not on file  Tobacco Use  . Smoking status: Never Smoker  . Smokeless tobacco: Never Used  Substance and Sexual Activity  . Alcohol use: No    Alcohol/week: 0.0 oz  . Drug use: No  . Sexual activity: Not Currently  Other Topics Concern  . Not on file  Social History Narrative  . Not on file    Family History  Problem Relation Age of Onset  . Alzheimer's disease Mother   . Hypertension Mother   . Stroke Father   . Heart disease Father   . Heart disease Brother   . Hypertension Brother   . Diabetes Brother   . Cancer Daughter        breast  . Hypertension Daughter   . Breast cancer Daughter      Current Outpatient Medications:  .  amiodarone (PACERONE) 200 MG tablet, Take 1 tablet by mouth daily., Disp: , Rfl:  .  amLODipine (NORVASC) 10 MG tablet, Take 1 tablet (10 mg total) by mouth daily., Disp: 90 tablet, Rfl: 1 .  azelastine (ASTELIN) 0.1 % nasal spray, Place 2 sprays into both nostrils 2 (two) times daily. Use in each nostril as directed, Disp: 30 mL, Rfl: 12 .  carvedilol (COREG) 25 MG tablet, TAKE 1 TABLET(25 MG) BY MOUTH TWICE DAILY, Disp: 180 tablet, Rfl: 0 .  cyanocobalamin 1000 MCG tablet, 1,000 mcg daily., Disp: , Rfl:  .  diclofenac sodium (VOLTAREN) 1 % GEL, Wrist twice daily, Disp: 100 g, Rfl: 0 .  fluticasone (FLONASE) 50 MCG/ACT nasal spray, Place 2 sprays into both nostrils daily., Disp: 16 g, Rfl: 6 .  folic acid (FOLVITE) 009 MCG tablet, Take 1 tablet by mouth daily. Reported on 04/19/2015, Disp: , Rfl:  .  irbesartan (AVAPRO) 300 MG tablet, TAKE ONE TABLET BY MOUTH EVERY DAY FOR FOR BLOOD PRESSURE, Disp: 90 tablet, Rfl: 1 .  omeprazole (PRILOSEC) 40 MG capsule, , Disp: , Rfl:  .  PRADAXA 150 MG CAPS capsule, Take 1 tablet by mouth 2 (two) times daily., Disp: , Rfl:  .  rosuvastatin (CRESTOR) 10 MG tablet, Take 1 tablet (10 mg total) by mouth daily. With Co-Q10, Disp: 90 tablet, Rfl: 1 .  Vitamin D, Cholecalciferol, 1000 UNITS TABS, Take 1  tablet by mouth daily., Disp: , Rfl:  .  zolpidem (AMBIEN) 10 MG tablet, TAKE 1 TABLET BY MOUTH AT BEDTIME AS NEEDED FOR SLEEP, Disp: 30 tablet, Rfl: 0  Physical exam:  Vitals:   06/11/17 1420 06/11/17 1422  BP:  (!) 142/82  Pulse:  (!) 54  Resp:  14  Temp:  (!) 97.4 F (36.3 C)  TempSrc:  Tympanic  Weight: 167 lb (75.8 kg)    Physical Exam  Constitutional: She is oriented to person, place, and time and well-developed, well-nourished, and in no distress.  HENT:  Head: Normocephalic and atraumatic.  Eyes: EOM are normal. Pupils are equal, round, and reactive to light.  Neck: Normal range of motion.  Cardiovascular: Normal rate, regular rhythm and normal heart sounds.  Pulmonary/Chest: Effort normal and breath sounds normal.  Abdominal: Soft. Bowel sounds are normal.  Musculoskeletal: She exhibits no edema (trace b/l).  Neurological: She is  alert and oriented to person, place, and time.  Skin: Skin is warm and dry.     CMP Latest Ref Rng & Units 03/09/2017  Glucose 65 - 99 mg/dL -  BUN 6 - 20 mg/dL -  Creatinine 0.44 - 1.00 mg/dL -  Sodium 135 - 145 mmol/L -  Potassium 3.5 - 5.3 mmol/L 4.1  Chloride 101 - 111 mmol/L -  CO2 22 - 32 mmol/L -  Calcium 8.9 - 10.3 mg/dL -  Total Protein 6.5 - 8.1 g/dL -  Total Bilirubin 0.3 - 1.2 mg/dL -  Alkaline Phos 38 - 126 U/L -  AST 15 - 41 U/L -  ALT 14 - 54 U/L -   CBC Latest Ref Rng & Units 06/11/2017  WBC 3.6 - 11.0 K/uL 5.3  Hemoglobin 12.0 - 16.0 g/dL 10.7(L)  Hematocrit 35.0 - 47.0 % 31.8(L)  Platelets 150 - 440 K/uL 148(L)     Assessment and plan- Patient is a 84 y.o. female with following issues:  1. Normocytic anemia- possibly due to chronic disease versus MDS. Hb remains stable between 10-11 over last 2 years. Peripheral blood workup unremarkable as above. Will hold off on bone marrow biopsy unless anemia worsens.   2. Thrombocytopenia- platelet counts between 120's- 170's over last 2 years. Waxes and wanes but no  consistent downward trend. Continue to monitor. Likely due to ITP versus MDS given her age  Repeat cbc with diff in 6 months and 1 year and I will see her back in 1 year   Visit Diagnosis 1. Thrombocytopenia ([place])   2. Normocytic anemia      Dr. Randa Evens, MD, MPH [facility] at [facility] Pager- [phone] 06/12/2017 8:17 AM

## 2017-07-02 ENCOUNTER — Telehealth: Payer: Self-pay | Admitting: Family Medicine

## 2017-07-02 ENCOUNTER — Other Ambulatory Visit: Payer: Self-pay | Admitting: Family Medicine

## 2017-07-02 DIAGNOSIS — G4700 Insomnia, unspecified: Secondary | ICD-10-CM

## 2017-07-02 NOTE — Telephone Encounter (Signed)
Copied from CRM [id number]. Topic: Quick Communication - See Telephone Encounter >> Jul 02, 2017  4:22 PM Guinevere Ferrari, NT wrote: CRM for notification. See Telephone encounter for: 07/02/17. Patient called and wanted to know why her zolpidem (AMBIEN) 10 MG tablet was rejected. Patient said she is willing to try another sleeping medication if insurance will not pay for medication. Pt uses [id number] - [place], [place] - [address] AT [address] [phone] (Phone) [phone] (Fax)

## 2017-07-03 ENCOUNTER — Other Ambulatory Visit: Payer: Self-pay | Admitting: Family Medicine

## 2017-07-03 ENCOUNTER — Ambulatory Visit (INDEPENDENT_AMBULATORY_CARE_PROVIDER_SITE_OTHER): Payer: Medicare Other

## 2017-07-03 VITALS — BP 129/60 | Ht 65.0 in | Wt 165.4 lb

## 2017-07-03 DIAGNOSIS — E538 Deficiency of other specified B group vitamins: Secondary | ICD-10-CM | POA: Diagnosis not present

## 2017-07-03 MED ORDER — CYANOCOBALAMIN 1000 MCG/ML IJ SOLN
1000.0000 ug | INTRAMUSCULAR | Status: AC
  Administered 2017-07-03 – 2020-03-22 (×9): 1000 ug via INTRAMUSCULAR

## 2017-07-03 MED ORDER — SUVOREXANT 10 MG PO TABS: 10.0000 | ORAL_TABLET | Freq: Every day | ORAL | 0 refills | Status: DC

## 2017-07-03 NOTE — Telephone Encounter (Signed)
Changed to belsomra, it will take a couple of weeks to work well for her

## 2017-07-03 NOTE — Progress Notes (Signed)
Patient came in for her B-12 injection. She tolerated it well. NKDA.   

## 2017-07-03 NOTE — Progress Notes (Signed)
trzodone

## 2017-07-04 ENCOUNTER — Other Ambulatory Visit: Payer: Self-pay | Admitting: Family Medicine

## 2017-07-04 DIAGNOSIS — E785 Hyperlipidemia, unspecified: Secondary | ICD-10-CM

## 2017-07-06 ENCOUNTER — Encounter: Payer: Self-pay | Admitting: Family Medicine

## 2017-07-06 ENCOUNTER — Ambulatory Visit (INDEPENDENT_AMBULATORY_CARE_PROVIDER_SITE_OTHER): Payer: Medicare Other | Admitting: Family Medicine

## 2017-07-06 VITALS — BP 129/60 | HR 64 | Temp 97.8°F | Resp 14 | Ht 65.0 in | Wt 165.5 lb

## 2017-07-06 DIAGNOSIS — E538 Deficiency of other specified B group vitamins: Secondary | ICD-10-CM

## 2017-07-06 DIAGNOSIS — K551 Chronic vascular disorders of intestine: Secondary | ICD-10-CM | POA: Diagnosis not present

## 2017-07-06 DIAGNOSIS — G4700 Insomnia, unspecified: Secondary | ICD-10-CM

## 2017-07-06 DIAGNOSIS — D696 Thrombocytopenia, unspecified: Secondary | ICD-10-CM

## 2017-07-06 DIAGNOSIS — I517 Cardiomegaly: Secondary | ICD-10-CM

## 2017-07-06 DIAGNOSIS — D692 Other nonthrombocytopenic purpura: Secondary | ICD-10-CM

## 2017-07-06 DIAGNOSIS — I10 Essential (primary) hypertension: Secondary | ICD-10-CM | POA: Diagnosis not present

## 2017-07-06 DIAGNOSIS — I272 Pulmonary hypertension, unspecified: Secondary | ICD-10-CM | POA: Diagnosis not present

## 2017-07-06 DIAGNOSIS — I422 Other hypertrophic cardiomyopathy: Secondary | ICD-10-CM

## 2017-07-06 DIAGNOSIS — I480 Paroxysmal atrial fibrillation: Secondary | ICD-10-CM | POA: Diagnosis not present

## 2017-07-06 DIAGNOSIS — E785 Hyperlipidemia, unspecified: Secondary | ICD-10-CM | POA: Diagnosis not present

## 2017-07-06 LAB — LIPID PANEL
CHOLESTEROL: 163 mg/dL (ref <200)
HDL: 73 mg/dL (ref >50)
LDL Cholesterol (Calc): 76 mg/dL (calc)
Non-HDL Cholesterol (Calc): 90 mg/dL (calc) (ref <130)
Total CHOL/HDL Ratio: 2.2 (calc) (ref <5.0)
Triglycerides: 59 mg/dL (ref <150)

## 2017-07-06 LAB — COMPLETE METABOLIC PANEL WITH GFR
AG RATIO: 1.3 (calc) (ref 1.0–2.5)
ALBUMIN MSPROF: 3.9 g/dL (ref 3.6–5.1)
ALKALINE PHOSPHATASE (APISO): 70 U/L (ref 33–130)
ALT: 11 U/L (ref 6–29)
AST: 21 U/L (ref 10–35)
BILIRUBIN TOTAL: 0.5 mg/dL (ref 0.2–1.2)
BUN / CREAT RATIO: 16 (calc) (ref 6–22)
BUN: 15 mg/dL (ref 7–25)
CHLORIDE: 109 mmol/L (ref 98–110)
CO2: 26 mmol/L (ref 20–32)
Calcium: 9.2 mg/dL (ref 8.6–10.4)
Creat: 0.92 mg/dL — ABNORMAL HIGH (ref 0.60–0.88)
GFR, EST AFRICAN AMERICAN: 68 mL/min/{1.73_m2} (ref >=60)
GFR, Est Non African American: 58 mL/min/{1.73_m2} — ABNORMAL LOW (ref >=60)
GLOBULIN: 3 g/dL (ref 1.9–3.7)
Glucose, Bld: 118 mg/dL (ref 65–139)
POTASSIUM: 3.9 mmol/L (ref 3.5–5.3)
SODIUM: 138 mmol/L (ref 135–146)
TOTAL PROTEIN: 6.9 g/dL (ref 6.1–8.1)

## 2017-07-06 MED ORDER — ZOLPIDEM TARTRATE 5 MG PO TABS: 10.0000 mg | ORAL_TABLET | Freq: Every evening | ORAL | 2 refills | Status: DC | PRN

## 2017-07-06 MED ORDER — CARVEDILOL 25 MG PO TABS: 25.0000 mg | ORAL_TABLET | Freq: Two times a day (BID) | ORAL | 1 refills | Status: DC

## 2017-07-06 MED ORDER — IRBESARTAN 300 MG PO TABS: ORAL_TABLET | ORAL | 1 refills | Status: DC

## 2017-07-06 MED ORDER — ROSUVASTATIN CALCIUM 10 MG PO TABS: 10.0000 mg | ORAL_TABLET | Freq: Every day | ORAL | 1 refills | Status: DC

## 2017-07-06 MED ORDER — AMLODIPINE BESYLATE 10 MG PO TABS: 10.0000 mg | ORAL_TABLET | Freq: Every day | ORAL | 1 refills | Status: DC

## 2017-07-06 NOTE — Progress Notes (Signed)
Name: Sharon Bullock   MRN: [id number]    DOB: July 01, 1933   Date:07/06/2017       Progress Note  Subjective  Chief Complaint  Chief Complaint  Patient presents with  . Insomnia  . Hypertension  . Atrial Fibrillation    HPI   HTN: bp is at goal today, no orthostatic changes, chest pain, SOB or palpitation. She has mild lower extremity swelling. She has mild orthopnea.   Carotid atherosclerosis: found during MRI head done at [facility] for evaluation of vertigo in 12/2015. On medical management at this time. She also has mesenteric insufficiency, she denies abdominal pain or blood in stools, no change in bowel movements. Normal appetite.   Perennial allergic rhinitis: she has post-nasal drainage, symptoms started 28years ago when she moved to  [place], but used to be intermittent but now is daily.  No facial pain , she has post-nasal drainage,  no fever. She saw ENT, was told secondary to allergies was given nasal spray but still has daily symptoms. She has intermittent hoarseness.   B12 deficiency with a previous history of pernicious anemia: she is receiving monthly B12 shots and has been doing well  Afib: she sees a cardiologist - Dr. Regino Schultze at [facility]. She has been on Pradaxa, she denies palpitation, she has some edema, she denies decrease in exercise tolerance   Insomnia: She tried Trazodone without help, only able to sleep one hour per night, she is on Ambien 10 mg, tried Ambien 5 mg without much help, however explained ehr will no longer allow me to send 10 mg dose because of her age and sex. Offered to change to belsomra, but she refuses  Hypertrophic cardiomyopathy and hypertensive pulmonary vascular disease: she sees Dr. Regino Schultze, no chest pain, SOB or paroxysmal nocturnal dyspnea, the bilateral swelling is back to baseline. She is on ARB, beta-blocker  and Pradaxa for afib.   Dyslipidemia: she is on Crestor and CoQ 10 and muscle pain has improved, due for labs next visit    Thrombocytopenia: seeing hematologist- Dr. Rao03/2018, recent labs stable - done at [facility],  she was given reassurance by Dr Smith Robert and also has normocytic anemia, likely of chronic disease  Patient Active Problem List   Diagnosis Date Noted  . Thrombocytopenia (HCC) 05/27/2016  . CHF (congestive heart failure) (HCC) 05/26/2016  . Mesenteric vascular insufficiency (HCC) 02/28/2016  . Pancreas cyst 02/28/2016  . Increased endometrial stripe thickness 02/28/2016  . Carotid atherosclerosis, bilateral 01/23/2016  . Hearing loss 08/17/2015  . Mitral regurgitation 04/19/2015  . Pernicious anemia 09/27/2014  . Paroxysmal A-fib (HCC) 09/27/2014  . Benign essential HTN 09/27/2014  . Perennial allergic rhinitis 09/27/2014  . Chronic anemia 09/27/2014  . Insomnia, persistent 09/27/2014  . Hypertensive pulmonary vascular disease (HCC) 09/27/2014  . B12 deficiency 09/27/2014  . Diverticulosis of colon 09/27/2014  . Dyslipidemia 09/27/2014  . Edema extremities 09/27/2014  . Gastric reflux 09/27/2014  . Bilateral hearing loss 09/27/2014  . Polypharmacy 09/27/2014  . Pelvic pain in female 09/27/2014  . MI (mitral incompetence) 09/27/2014  . Internal hemorrhoids 09/27/2014  . Osteopenia 09/27/2014  . Arthralgia of shoulder 09/27/2014  . Finger tendinitis 09/27/2014  . Urge incontinence 09/27/2014  . Cervical prolapse 09/27/2014  . Vertigo 09/27/2014  . Vitamin D deficiency 09/27/2014  . Bladder cystocele 04/15/2012  . Asymmetric septal hypertrophy (HCC) 05/19/2011  . Hypertrophic cardiomyopathy (HCC) 05/19/2011    Past Surgical History:  Procedure Laterality Date  . BLADDER REPAIR  2014   tact  Family History  Problem Relation Age of Onset  . Alzheimer's disease Mother   . Hypertension Mother   . Stroke Father   . Heart disease Father   . Heart disease Brother   . Hypertension Brother   . Diabetes Brother   . Cancer Daughter        breast  . Hypertension Daughter   .  Breast cancer Daughter     Social History   Socioeconomic History  . Marital status: Married    Spouse name: Not on file  . Number of children: Not on file  . Years of education: Not on file  . Highest education level: Not on file  Occupational History  . Not on file  Social Needs  . Financial resource strain: Not on file  . Food insecurity:    Worry: Not on file    Inability: Not on file  . Transportation needs:    Medical: Not on file    Non-medical: Not on file  Tobacco Use  . Smoking status: Never Smoker  . Smokeless tobacco: Never Used  Substance and Sexual Activity  . Alcohol use: No    Alcohol/week: 0.0 oz  . Drug use: No  . Sexual activity: Not Currently  Lifestyle  . Physical activity:    Days per week: Not on file    Minutes per session: Not on file  . Stress: Not on file  Relationships  . Social connections:    Talks on phone: Not on file    Gets together: Not on file    Attends religious service: Not on file    Active member of club or organization: Not on file    Attends meetings of clubs or organizations: Not on file    Relationship status: Not on file  . Intimate partner violence:    Fear of current or ex partner: Not on file    Emotionally abused: Not on file    Physically abused: Not on file    Forced sexual activity: Not on file  Other Topics Concern  . Not on file  Social History Narrative  . Not on file     Current Outpatient Medications:  .  amiodarone (PACERONE) 200 MG tablet, Take 1 tablet by mouth daily., Disp: , Rfl:  .  amLODipine (NORVASC) 10 MG tablet, Take 1 tablet (10 mg total) by mouth daily., Disp: 90 tablet, Rfl: 1 .  azelastine (ASTELIN) 0.1 % nasal spray, Place 2 sprays into both nostrils 2 (two) times daily. Use in each nostril as directed, Disp: 30 mL, Rfl: 12 .  carvedilol (COREG) 25 MG tablet, TAKE 1 TABLET(25 MG) BY MOUTH TWICE DAILY, Disp: 180 tablet, Rfl: 0 .  diclofenac sodium (VOLTAREN) 1 % GEL, Wrist twice daily,  Disp: 100 g, Rfl: 0 .  fluticasone (FLONASE) 50 MCG/ACT nasal spray, Place 2 sprays into both nostrils daily., Disp: 16 g, Rfl: 6 .  folic acid (FOLVITE) 400 MCG tablet, Take 1 tablet by mouth daily. Reported on 04/19/2015, Disp: , Rfl:  .  irbesartan (AVAPRO) 300 MG tablet, TAKE ONE TABLET BY MOUTH EVERY DAY FOR FOR BLOOD PRESSURE, Disp: 90 tablet, Rfl: 1 .  omeprazole (PRILOSEC) 40 MG capsule, , Disp: , Rfl:  .  PRADAXA 150 MG CAPS capsule, Take 1 tablet by mouth 2 (two) times daily., Disp: , Rfl:  .  rosuvastatin (CRESTOR) 10 MG tablet, Take 1 tablet (10 mg total) by mouth daily. With Co-Q10, Disp: 90 tablet, Rfl: 1 .  Vitamin D, Cholecalciferol, 1000 UNITS TABS, Take 1 tablet by mouth daily., Disp: , Rfl:  .  zolpidem (AMBIEN) 10 MG tablet, TAKE 1 TABLET BY MOUTH AT BEDTIME AS NEEDED FOR SLEEP, Disp: 30 tablet, Rfl: 0  Current Facility-Administered Medications:  .  cyanocobalamin ((VITAMIN B-12)) injection 1,000 mcg, 1,000 mcg, Intramuscular, Q30 days, Alba Cory, MD, 1,000 mcg at 07/03/17 1207  Allergies  Allergen Reactions  . Nickel Dermatitis and Swelling  . Other Anaphylaxis    Uncoded Allergy. Allergen: SHELLFISH  . Ace Inhibitors Cough     ROS  Constitutional: Negative for fever or weight change.  Respiratory: Negative for cough and shortness of breath.   Cardiovascular: Negative for chest pain or palpitations.  Gastrointestinal: Negative for abdominal pain, no bowel changes.  Musculoskeletal: Negative for gait problem or joint swelling.  Skin: Negative for rash.  Neurological: Negative for dizziness or headache.  No other specific complaints in a complete review of systems (except as listed in HPI above).  Objective  Vitals:   07/06/17 0854  BP: 129/60  Pulse: 64  Resp: 14  Temp: 97.8 F (36.6 C)  TempSrc: Oral  SpO2: 98%  Weight: 165 lb 8 oz (75.1 kg)  Height: 5\' 5"  (1.651 m)    Body mass index is 27.54 kg/m.  Physical Exam  Constitutional: Patient  appears well-developed and well-nourished. No distress.  HEENT: head atraumatic, normocephalic, pupils equal and reactive to light,  neck supple, throat within normal limits, boggy turbintates Cardiovascular: Normal rate, regular rhythm , 4/6 SEM ,trace BLE ankle edema. Pulmonary/Chest: Effort normal and breath sounds normal. No respiratory distress. Abdominal: Soft.  There is no tenderness. Psychiatric: Patient has a normal mood and affect. behavior is normal. Judgment and thought content normal.   Recent Results (from the past 2160 hour(s))  CBC with Differential     Status: Abnormal   Collection Time: 06/11/17  1:50 PM  Result Value Ref Range   WBC 5.3 3.6 - 11.0 K/uL   RBC 3.48 (L) 3.80 - 5.20 MIL/uL   Hemoglobin 10.7 (L) 12.0 - 16.0 g/dL   HCT 16.1 (L) 09.6 - 04.5 %   MCV 91.4 80.0 - 100.0 fL   MCH 30.8 26.0 - 34.0 pg   MCHC 33.7 32.0 - 36.0 g/dL   RDW 40.9 81.1 - 91.4 %   Platelets 148 (L) 150 - 440 K/uL   Neutrophils Relative % 59 %   Neutro Abs 3.1 1.4 - 6.5 K/uL   Lymphocytes Relative 29 %   Lymphs Abs 1.6 1.0 - 3.6 K/uL   Monocytes Relative 9 %   Monocytes Absolute 0.5 0.2 - 0.9 K/uL   Eosinophils Relative 2 %   Eosinophils Absolute 0.1 0 - 0.7 K/uL   Basophils Relative 1 %   Basophils Absolute 0.0 0 - 0.1 K/uL    Comment: Performed at [facility], [address] [place], [place] [address]      PHQ2/9: Depression screen [facility] 2/9 11/25/2016 08/25/2016 05/26/2016 01/23/2016 08/17/2015  Decreased Interest 0 0 0 0 0  Down, Depressed, Hopeless 0 0 0 0 0  PHQ - 2 Score 0 0 0 0 0     Fall Risk: Fall Risk  11/25/2016 08/25/2016 05/26/2016 08/17/2015 04/19/2015  Falls in the past year? Yes Yes No No No  Number falls in past yr: 1 1 - - -  Injury with Fall? No No - - -  Follow up Falls evaluation completed - - - -  Assessment & Plan  1. Insomnia, persistent  Explained max dose is 5 mg - zolpidem (AMBIEN) 5 MG tablet; Take 2 tablets (10 mg total) by mouth at  bedtime as needed. for sleep  Dispense: 30 tablet; Refill: 2  2. Dyslipidemia  - rosuvastatin (CRESTOR) 10 MG tablet; Take 1 tablet (10 mg total) by mouth daily. With Co-Q10  Dispense: 90 tablet; Refill: 1 - lipid panel   3. Benign essential HTN  - irbesartan (AVAPRO) 300 MG tablet; TAKE ONE TABLET BY MOUTH EVERY DAY FOR FOR BLOOD PRESSURE  Dispense: 90 tablet; Refill: 1 - amLODipine (NORVASC) 10 MG tablet; Take 1 tablet (10 mg total) by mouth daily.  Dispense: 90 tablet; Refill: 1 - carvedilol (COREG) 25 MG tablet; Take 1 tablet (25 mg total) by mouth 2 (two) times daily with a meal.  Dispense: 180 tablet; Refill: 1 - comp panel   4. B12 deficiency  Continue B12 injections  5. Mesenteric vascular insufficiency (HCC)  Continue follow up with vascular surgeon   6. Paroxysmal A-fib (HCC)  Rate controlled, continue follow up with Dr. Regino Schultze  7. Asymmetric septal hypertrophy (HCC)  No chest pain   8. Senile purpura (HCC)  stable  9. Thrombocytopenia (HCC)   10. Hypertensive pulmonary vascular disease [facility])  Sees cardiologist

## 2017-07-09 ENCOUNTER — Other Ambulatory Visit: Payer: Self-pay | Admitting: Family Medicine

## 2017-07-09 DIAGNOSIS — M25531 Pain in right wrist: Secondary | ICD-10-CM

## 2017-08-04 ENCOUNTER — Other Ambulatory Visit: Payer: Self-pay | Admitting: Family Medicine

## 2017-08-04 NOTE — Telephone Encounter (Signed)
Pt checking status on her Prilosec being filled.    Copied from CRM [id number]. Topic: Quick Communication - Rx Refill/Question >> Aug 04, 2017  4:53 PM Arlyss Gandy, NT wrote: Medication: omeprazole (PRILOSEC) 40 MG capsule  Has the patient contacted their pharmacy? Yes.   (Agent: If no, request that the patient contact the pharmacy for the refill.) Preferred Pharmacy (with phone number or street name): [id number] - [place], [place] - [address] [phone] (Phone) [phone] (Fax)     Agent: Please be advised that RX refills may take up to 3 business days. We ask that you follow-up with your pharmacy.

## 2017-08-05 NOTE — Telephone Encounter (Signed)
LOV 07/06/17 Dr. Carlynn Purl

## 2017-08-10 MED ORDER — OMEPRAZOLE 40 MG PO CPDR: 40.0000 mg | DELAYED_RELEASE_CAPSULE | Freq: Every day | ORAL | 0 refills | Status: DC

## 2017-08-10 NOTE — Telephone Encounter (Signed)
Refill request for general medication: Omeprazole 40mg   Last office visit: 07/06/2017  Last physical exam: None indicated  Follow-ups on file. 10/07/2017

## 2017-09-03 ENCOUNTER — Other Ambulatory Visit: Payer: Self-pay | Admitting: Family Medicine

## 2017-09-03 NOTE — Telephone Encounter (Signed)
Rx refill request

## 2017-09-03 NOTE — Telephone Encounter (Signed)
Refill request for general medication: Pradaxa 150 mg  Last office visit: 07/06/2017  Last physical exam: None indicated  Follow-ups on file. 10/07/2017

## 2017-09-03 NOTE — Telephone Encounter (Signed)
Copied from CRM [id number]. Topic: Quick Communication - See Telephone Encounter >> Sep 03, 2017  9:58 AM Trula Slade wrote: CRM for notification. See Telephone encounter for: 09/03/17. Patient would like to have her PRADAXA 150 MG CAPS capsule medication called in to her preferred pharmacy [facility] [address] [facility].

## 2017-09-04 MED ORDER — PRADAXA 150 MG PO CAPS: 150.0000 mg | ORAL_CAPSULE | Freq: Two times a day (BID) | ORAL | 5 refills | Status: DC

## 2017-09-14 ENCOUNTER — Other Ambulatory Visit: Payer: Self-pay | Admitting: Family Medicine

## 2017-09-14 DIAGNOSIS — G4700 Insomnia, unspecified: Secondary | ICD-10-CM

## 2017-09-23 DIAGNOSIS — E785 Hyperlipidemia, unspecified: Secondary | ICD-10-CM | POA: Diagnosis not present

## 2017-09-23 DIAGNOSIS — I422 Other hypertrophic cardiomyopathy: Secondary | ICD-10-CM | POA: Diagnosis not present

## 2017-09-23 DIAGNOSIS — I4891 Unspecified atrial fibrillation: Secondary | ICD-10-CM | POA: Diagnosis not present

## 2017-09-23 DIAGNOSIS — I340 Nonrheumatic mitral (valve) insufficiency: Secondary | ICD-10-CM | POA: Diagnosis not present

## 2017-09-23 DIAGNOSIS — G4700 Insomnia, unspecified: Secondary | ICD-10-CM | POA: Diagnosis present

## 2017-09-23 DIAGNOSIS — Z7901 Long term (current) use of anticoagulants: Secondary | ICD-10-CM | POA: Diagnosis not present

## 2017-09-23 DIAGNOSIS — I480 Paroxysmal atrial fibrillation: Secondary | ICD-10-CM | POA: Diagnosis not present

## 2017-09-23 DIAGNOSIS — R630 Anorexia: Secondary | ICD-10-CM | POA: Diagnosis not present

## 2017-09-23 DIAGNOSIS — Z7902 Long term (current) use of antithrombotics/antiplatelets: Secondary | ICD-10-CM | POA: Diagnosis not present

## 2017-09-23 DIAGNOSIS — I10 Essential (primary) hypertension: Secondary | ICD-10-CM | POA: Diagnosis not present

## 2017-09-23 DIAGNOSIS — I5033 Acute on chronic diastolic (congestive) heart failure: Secondary | ICD-10-CM | POA: Diagnosis not present

## 2017-09-23 DIAGNOSIS — M810 Age-related osteoporosis without current pathological fracture: Secondary | ICD-10-CM | POA: Diagnosis not present

## 2017-09-23 DIAGNOSIS — K219 Gastro-esophageal reflux disease without esophagitis: Secondary | ICD-10-CM | POA: Diagnosis not present

## 2017-09-23 DIAGNOSIS — R600 Localized edema: Secondary | ICD-10-CM | POA: Diagnosis not present

## 2017-10-02 DIAGNOSIS — I10 Essential (primary) hypertension: Secondary | ICD-10-CM | POA: Diagnosis not present

## 2017-10-02 DIAGNOSIS — I480 Paroxysmal atrial fibrillation: Secondary | ICD-10-CM | POA: Diagnosis not present

## 2017-10-02 DIAGNOSIS — I340 Nonrheumatic mitral (valve) insufficiency: Secondary | ICD-10-CM | POA: Diagnosis not present

## 2017-10-07 ENCOUNTER — Encounter: Payer: Self-pay | Admitting: Family Medicine

## 2017-10-07 ENCOUNTER — Ambulatory Visit (INDEPENDENT_AMBULATORY_CARE_PROVIDER_SITE_OTHER): Payer: Medicare Other | Admitting: Family Medicine

## 2017-10-07 VITALS — BP 142/56 | HR 55 | Resp 16 | Ht 65.0 in | Wt 165.2 lb

## 2017-10-07 DIAGNOSIS — D692 Other nonthrombocytopenic purpura: Secondary | ICD-10-CM | POA: Diagnosis not present

## 2017-10-07 DIAGNOSIS — K862 Cyst of pancreas: Secondary | ICD-10-CM | POA: Diagnosis not present

## 2017-10-07 DIAGNOSIS — G4700 Insomnia, unspecified: Secondary | ICD-10-CM | POA: Diagnosis not present

## 2017-10-07 DIAGNOSIS — R351 Nocturia: Secondary | ICD-10-CM

## 2017-10-07 DIAGNOSIS — F4321 Adjustment disorder with depressed mood: Secondary | ICD-10-CM | POA: Diagnosis not present

## 2017-10-07 DIAGNOSIS — E538 Deficiency of other specified B group vitamins: Secondary | ICD-10-CM

## 2017-10-07 DIAGNOSIS — I480 Paroxysmal atrial fibrillation: Secondary | ICD-10-CM | POA: Diagnosis not present

## 2017-10-07 DIAGNOSIS — D696 Thrombocytopenia, unspecified: Secondary | ICD-10-CM

## 2017-10-07 DIAGNOSIS — I10 Essential (primary) hypertension: Secondary | ICD-10-CM | POA: Diagnosis not present

## 2017-10-07 DIAGNOSIS — K551 Chronic vascular disorders of intestine: Secondary | ICD-10-CM

## 2017-10-07 DIAGNOSIS — I272 Pulmonary hypertension, unspecified: Secondary | ICD-10-CM | POA: Diagnosis not present

## 2017-10-07 DIAGNOSIS — E785 Hyperlipidemia, unspecified: Secondary | ICD-10-CM | POA: Diagnosis not present

## 2017-10-07 MED ORDER — OXYBUTYNIN CHLORIDE 5 MG PO TABS: 5.0000 mg | ORAL_TABLET | Freq: Three times a day (TID) | ORAL | 0 refills | Status: DC

## 2017-10-07 NOTE — Progress Notes (Signed)
Name: Sharon Bullock   MRN: [id number]    DOB: Oct 27, 1933   Date:10/07/2017       Progress Note  Subjective  Chief Complaint  Chief Complaint  Patient presents with  . Insomnia    3 month follow up. Ambien 5 mg has been working ok for her. Urinating keeps her awake at night.  . Chills  . Nocturia    She has been getting up at least 4 times nightly to urinate.  . B12 Injection    HPI  HTN: bp is slightly elevated , no orthostatic changes, chest pain, SOB or palpitation. She has mild lower extremity swelling that is stable. . She has mild orthopnea.   Carotid atherosclerosis: found during MRI head done at [facility] for evaluation of vertigo in 12/2015. On medical management at this time. She also has mesenteric insufficiency, she denies abdominal pain or blood in stools, no change in bowel movements. Normal appetite. Taking Crestor daily   B12 deficiency with a previous history of pernicious anemia: she is receiving monthly B12 shots and has been doing well. She had another today   Afib: she sees a cardiologist - Dr. Regino Schultze at [facility]. She has been on Pradaxa, she denies palpitation, she has some edema, she denies decrease in exercise tolerance. She recently went to [facility] with palpitation, BNP was high, she was given IV pacerone and she has been doing well since.   Insomnia: She tried Trazodone without help, only able to sleep one hour per night, she is on Ambien 10 mg, tried Ambien 5 mg without much help,but insurance stopped paying for it, so she is back on 5 mg. She states recently noticing nocturia. She states she stopped Vesicare because was expensive, we will try oxybutin. No dysuria, and last urine culture done in June at [facility], mixed flora   Hypertrophic cardiomyopathy and hypertensive pulmonary vascular disease: she sees Dr. Regino Schultze, no chest pain, SOB orparoxysmal nocturnal dyspnea, the bilateral swelling is back to baseline. She is on ARB, beta-blocker  and Pradaxa for afib. She also  has hypertensive pulmonary vascular disease. Under the care of Dr. Regino Schultze and will have repeat echo soon   Dyslipidemia: she is on Crestor and CoQ 10, reviewed labs and they are at goal   Thrombocytopenia: seeing hematologist- Dr. Rao03/2018, recent labsstable - done at [facility] was given reassurance by Dr Smith Robert and also has normocytic anemia, likely of chronic disease. Unchanged   Grieving: lost her husband, he had diarrhea, she left in am and when she got back he was laying on the floor, he states lots of unanswered question. Refer to hospice for grief counseling   Pancreatic cyst: found on MRI done at [facility], stable in  Size   Patient Active Problem List   Diagnosis Date Noted  . Thrombocytopenia (HCC) 05/27/2016  . CHF (congestive heart failure) (HCC) 05/26/2016  . Mesenteric vascular insufficiency (HCC) 02/28/2016  . Pancreas cyst 02/28/2016  . Increased endometrial stripe thickness 02/28/2016  . Carotid atherosclerosis, bilateral 01/23/2016  . Hearing loss 08/17/2015  . Mitral regurgitation 04/19/2015  . Pernicious anemia 09/27/2014  . Paroxysmal A-fib (HCC) 09/27/2014  . Benign essential HTN 09/27/2014  . Perennial allergic rhinitis 09/27/2014  . Chronic anemia 09/27/2014  . Insomnia, persistent 09/27/2014  . Hypertensive pulmonary vascular disease (HCC) 09/27/2014  . B12 deficiency 09/27/2014  . Diverticulosis of colon 09/27/2014  . Dyslipidemia 09/27/2014  . Edema extremities 09/27/2014  . Gastric reflux 09/27/2014  . Bilateral hearing loss 09/27/2014  .  Polypharmacy 09/27/2014  . Pelvic pain in female 09/27/2014  . MI (mitral incompetence) 09/27/2014  . Internal hemorrhoids 09/27/2014  . Osteopenia 09/27/2014  . Arthralgia of shoulder 09/27/2014  . Finger tendinitis 09/27/2014  . Urge incontinence 09/27/2014  . Cervical prolapse 09/27/2014  . Vertigo 09/27/2014  . Vitamin D deficiency 09/27/2014  . Bladder cystocele 04/15/2012  . Asymmetric septal hypertrophy  (HCC) 05/19/2011  . Hypertrophic cardiomyopathy (HCC) 05/19/2011    Past Surgical History:  Procedure Laterality Date  . BLADDER REPAIR  2014   tact    Family History  Problem Relation Age of Onset  . Alzheimer's disease Mother   . Hypertension Mother   . Stroke Father   . Heart disease Father   . Heart disease Brother   . Hypertension Brother   . Diabetes Brother   . Cancer Daughter        breast  . Hypertension Daughter   . Breast cancer Daughter     Social History   Socioeconomic History  . Marital status: Widowed    Spouse name: Not on file  . Number of children: 5  . Years of education: Not on file  . Highest education level: Not on file  Occupational History  . Not on file  Social Needs  . Financial resource strain: Not on file  . Food insecurity:    Worry: Not on file    Inability: Not on file  . Transportation needs:    Medical: Not on file    Non-medical: Not on file  Tobacco Use  . Smoking status: Never Smoker  . Smokeless tobacco: Never Used  Substance and Sexual Activity  . Alcohol use: No    Alcohol/week: 0.0 oz  . Drug use: No  . Sexual activity: Not Currently  Lifestyle  . Physical activity:    Days per week: Not on file    Minutes per session: Not on file  . Stress: Not on file  Relationships  . Social connections:    Talks on phone: Not on file    Gets together: Not on file    Attends religious service: Not on file    Active member of club or organization: Not on file    Attends meetings of clubs or organizations: Not on file    Relationship status: Not on file  . Intimate partner violence:    Fear of current or ex partner: Not on file    Emotionally abused: Not on file    Physically abused: Not on file    Forced sexual activity: Not on file  Other Topics Concern  . Not on file  Social History Narrative   Lost husband 07/18/2017   Grown son still living with her      Current Outpatient Medications:  .  amiodarone  (PACERONE) 200 MG tablet, Take 1 tablet by mouth daily., Disp: , Rfl:  .  amLODipine (NORVASC) 10 MG tablet, Take 1 tablet (10 mg total) by mouth daily., Disp: 90 tablet, Rfl: 1 .  azelastine (ASTELIN) 0.1 % nasal spray, Place 2 sprays into both nostrils 2 (two) times daily. Use in each nostril as directed, Disp: 30 mL, Rfl: 12 .  carvedilol (COREG) 25 MG tablet, Take 1 tablet (25 mg total) by mouth 2 (two) times daily with a meal., Disp: 180 tablet, Rfl: 1 .  diclofenac sodium (VOLTAREN) 1 % GEL, APPLY TO WRIST TWICE DAILY, Disp: 100 g, Rfl: 0 .  fluticasone (FLONASE) 50 MCG/ACT nasal spray, Place 2  sprays into both nostrils daily., Disp: 16 g, Rfl: 6 .  folic acid (FOLVITE) 400 MCG tablet, Take 1 tablet by mouth daily. Reported on 04/19/2015, Disp: , Rfl:  .  irbesartan (AVAPRO) 300 MG tablet, TAKE ONE TABLET BY MOUTH EVERY DAY FOR FOR BLOOD PRESSURE, Disp: 90 tablet, Rfl: 1 .  omeprazole (PRILOSEC) 40 MG capsule, Take 1 capsule (40 mg total) by mouth daily., Disp: 90 capsule, Rfl: 0 .  PRADAXA 150 MG CAPS capsule, Take 1 capsule (150 mg total) by mouth 2 (two) times daily., Disp: 60 capsule, Rfl: 5 .  rosuvastatin (CRESTOR) 10 MG tablet, Take 1 tablet (10 mg total) by mouth daily. With Co-Q10, Disp: 90 tablet, Rfl: 1 .  Vitamin D, Cholecalciferol, 1000 UNITS TABS, Take 1 tablet by mouth daily., Disp: , Rfl:  .  zolpidem (AMBIEN) 5 MG tablet, Take 1 tablet (5 mg total) by mouth at bedtime., Disp: 30 tablet, Rfl: 1 .  oxybutynin (DITROPAN) 5 MG tablet, Take 1 tablet (5 mg total) by mouth 3 (three) times daily., Disp: 60 tablet, Rfl: 0  Current Facility-Administered Medications:  .  cyanocobalamin ((VITAMIN B-12)) injection 1,000 mcg, 1,000 mcg, Intramuscular, Q30 days, Alba Cory, MD, 1,000 mcg at 10/07/17 1019  Allergies  Allergen Reactions  . Nickel Dermatitis and Swelling  . Other Anaphylaxis    Uncoded Allergy. Allergen: SHELLFISH  . Ace Inhibitors Cough     ROS  Constitutional:  Negative for fever or weight change.  Respiratory: positive  for cough but denies  shortness of breath.   Cardiovascular: Negative for chest pain or palpitations.  Gastrointestinal: Negative for abdominal pain, no bowel changes.  Musculoskeletal: Negative for gait problem or joint swelling.  Skin: Negative for rash.  Neurological: Negative for dizziness or headache.  No other specific complaints in a complete review of systems (except as listed in HPI above).  Objective  Vitals:   10/07/17 1000  BP: (!) 142/56  Pulse: (!) 55  Resp: 16  SpO2: 98%  Weight: 165 lb 3.2 oz (74.9 kg)  Height: 5\' 5"  (1.651 m)    Body mass index is 27.49 kg/m.  Physical Exam  Constitutional: Patient appears well-developed and well-nourished. Overweight.  No distress.  HEENT: head atraumatic, normocephalic, pupils equal and reactive to light,  neck supple, throat within normal limits Cardiovascular: Normal rate, regular rhythm and normal heart sounds. 4/6 systolic  murmur heard. 1 plus pitting  BLE edema - ankle. . Pulmonary/Chest: Effort normal and breath sounds normal. No respiratory distress. Abdominal: Soft.  There is no tenderness. Psychiatric: Patient has a normal mood and affect. behavior is normal. Judgment and thought content normal.   PHQ2/9: Depression screen [facility] 2/9 10/07/2017 10/07/2017 11/25/2016 08/25/2016 05/26/2016  Decreased Interest - 1 0 0 0  Down, Depressed, Hopeless - 1 0 0 0  PHQ - 2 Score - 2 0 0 0  Altered sleeping - 2 - - -  Tired, decreased energy - 0 - - -  Change in appetite - 1 - - -  Feeling bad or failure about yourself  - 0 - - -  Trouble concentrating - 0 - - -  Moving slowly or fidgety/restless - 1 - - -  Suicidal thoughts - 0 - - -  PHQ-9 Score - 6 - - -  Difficult doing work/chores (No Data) Not difficult at all - - -    Fall Risk: Fall Risk  10/07/2017 10/07/2017 11/25/2016 08/25/2016 05/26/2016  Falls in the past year? No No  Yes Yes No  Number falls in past yr:  - - 1 1 -  Injury with Fall? - - No No -  Follow up - - Falls evaluation completed - -     Functional Status Survey: Is the patient deaf or have difficulty hearing?: No Does the patient have difficulty seeing, even when wearing glasses/contacts?: No Does the patient have difficulty concentrating, remembering, or making decisions?: No Does the patient have difficulty walking or climbing stairs?: No Does the patient have difficulty dressing or bathing?: No Does the patient have difficulty doing errands alone such as visiting a doctor's office or shopping?: No    Assessment & Plan  1. Benign essential HTN  Continue medication   2. Senile purpura (HCC)  Stable, reassurance given   3. Mesenteric vascular insufficiency (HCC)  No abdominal pain after meals, on medical management   4. Thrombocytopenia (HCC)  Keep follow up with hematologist   5. Hypertensive pulmonary vascular disease (HCC)   6. Dyslipidemia  On crestor   7. Paroxysmal A-fib (HCC)  Rate controlled at this time  8. Insomnia, persistent  Continue Ambine   9. Nocturia  We will try cheaper medication  - oxybutynin (DITROPAN) 5 MG tablet; Take 1 tablet (5 mg total) by mouth 3 (three) times daily.  Dispense: 60 tablet; Refill: 0  10. Grief  Gave information for hospice free grief counseling   11. Pancreatic cyst  Stable in size

## 2017-10-24 ENCOUNTER — Other Ambulatory Visit: Payer: Self-pay | Admitting: Family Medicine

## 2017-11-11 ENCOUNTER — Other Ambulatory Visit: Payer: Self-pay | Admitting: Family Medicine

## 2017-11-11 DIAGNOSIS — G4700 Insomnia, unspecified: Secondary | ICD-10-CM

## 2017-11-13 ENCOUNTER — Other Ambulatory Visit: Payer: Self-pay

## 2017-11-13 MED ORDER — ZOLPIDEM TARTRATE 5 MG PO TABS: 5.0000 mg | ORAL_TABLET | Freq: Every evening | ORAL | 1 refills | Status: DC | PRN

## 2017-11-13 NOTE — Telephone Encounter (Signed)
I got a fax from [facility] stating that this patient's insurance does not cover Zolpidem 5mg  tablet. It suggested that the preferred alternative is Zolpidem Tartrate.  Refill request was sent to Dr. Alba Cory for approval and submission.

## 2017-11-20 ENCOUNTER — Ambulatory Visit (INDEPENDENT_AMBULATORY_CARE_PROVIDER_SITE_OTHER): Payer: Medicare Other

## 2017-11-20 DIAGNOSIS — E538 Deficiency of other specified B group vitamins: Secondary | ICD-10-CM | POA: Diagnosis not present

## 2017-12-08 ENCOUNTER — Ambulatory Visit: Payer: Medicare Other | Admitting: Oncology

## 2017-12-08 ENCOUNTER — Inpatient Hospital Stay: Payer: Medicare Other | Attending: Oncology

## 2017-12-09 ENCOUNTER — Other Ambulatory Visit: Payer: Medicare Other

## 2017-12-18 ENCOUNTER — Ambulatory Visit (INDEPENDENT_AMBULATORY_CARE_PROVIDER_SITE_OTHER): Payer: Medicare Other

## 2017-12-18 DIAGNOSIS — Z23 Encounter for immunization: Secondary | ICD-10-CM | POA: Diagnosis not present

## 2017-12-18 DIAGNOSIS — E538 Deficiency of other specified B group vitamins: Secondary | ICD-10-CM

## 2017-12-18 MED ORDER — CYANOCOBALAMIN 1000 MCG/ML IJ SOLN
1000.0000 ug | Freq: Once | INTRAMUSCULAR | Status: AC
  Administered 2017-12-18: 1000 ug via INTRAMUSCULAR

## 2017-12-21 DIAGNOSIS — I480 Paroxysmal atrial fibrillation: Secondary | ICD-10-CM | POA: Diagnosis not present

## 2017-12-21 DIAGNOSIS — I422 Other hypertrophic cardiomyopathy: Secondary | ICD-10-CM | POA: Diagnosis not present

## 2017-12-30 ENCOUNTER — Other Ambulatory Visit: Payer: Self-pay | Admitting: Family Medicine

## 2018-01-04 ENCOUNTER — Telehealth: Payer: Self-pay | Admitting: Family Medicine

## 2018-01-04 NOTE — Telephone Encounter (Signed)
Copied from CRM [id number]. Topic: Quick Communication - See Telephone Encounter >> Jan 04, 2018  4:40 PM Jens Som A wrote: CRM for notification. See Telephone encounter for: 01/04/18. Patient is calling to find out why her zolpidem (AMBIEN) 5 MG tablet [[id number]] has been rejected please advise Patient call back number [phone]

## 2018-01-06 NOTE — Telephone Encounter (Signed)
Patient notified and called into [facility] on [place], [place].

## 2018-01-06 NOTE — Telephone Encounter (Signed)
Please call in 7 days to local pharmacy until her follow up, controlled medication

## 2018-01-08 ENCOUNTER — Other Ambulatory Visit: Payer: Self-pay | Admitting: Family Medicine

## 2018-01-08 DIAGNOSIS — I10 Essential (primary) hypertension: Secondary | ICD-10-CM

## 2018-01-12 ENCOUNTER — Encounter: Payer: Self-pay | Admitting: Family Medicine

## 2018-01-12 ENCOUNTER — Ambulatory Visit: Payer: Self-pay | Admitting: *Deleted

## 2018-01-12 ENCOUNTER — Ambulatory Visit (INDEPENDENT_AMBULATORY_CARE_PROVIDER_SITE_OTHER): Payer: Medicare Other

## 2018-01-12 ENCOUNTER — Ambulatory Visit (INDEPENDENT_AMBULATORY_CARE_PROVIDER_SITE_OTHER): Payer: Medicare Other | Admitting: Family Medicine

## 2018-01-12 VITALS — BP 142/68 | HR 55 | Temp 97.7°F | Resp 16 | Ht 65.0 in | Wt 160.8 lb

## 2018-01-12 VITALS — BP 146/60 | HR 55 | Temp 97.6°F | Resp 16 | Ht 65.0 in | Wt 160.0 lb

## 2018-01-12 DIAGNOSIS — Z0000 Encounter for general adult medical examination without abnormal findings: Secondary | ICD-10-CM | POA: Diagnosis not present

## 2018-01-12 DIAGNOSIS — E785 Hyperlipidemia, unspecified: Secondary | ICD-10-CM

## 2018-01-12 DIAGNOSIS — Z9181 History of falling: Secondary | ICD-10-CM | POA: Diagnosis not present

## 2018-01-12 DIAGNOSIS — M79605 Pain in left leg: Secondary | ICD-10-CM

## 2018-01-12 DIAGNOSIS — E2839 Other primary ovarian failure: Secondary | ICD-10-CM

## 2018-01-12 DIAGNOSIS — D696 Thrombocytopenia, unspecified: Secondary | ICD-10-CM

## 2018-01-12 DIAGNOSIS — G4700 Insomnia, unspecified: Secondary | ICD-10-CM | POA: Diagnosis not present

## 2018-01-12 DIAGNOSIS — K219 Gastro-esophageal reflux disease without esophagitis: Secondary | ICD-10-CM

## 2018-01-12 DIAGNOSIS — E538 Deficiency of other specified B group vitamins: Secondary | ICD-10-CM

## 2018-01-12 DIAGNOSIS — I480 Paroxysmal atrial fibrillation: Secondary | ICD-10-CM

## 2018-01-12 DIAGNOSIS — F321 Major depressive disorder, single episode, moderate: Secondary | ICD-10-CM | POA: Diagnosis not present

## 2018-01-12 DIAGNOSIS — M2550 Pain in unspecified joint: Secondary | ICD-10-CM

## 2018-01-12 DIAGNOSIS — K551 Chronic vascular disorders of intestine: Secondary | ICD-10-CM

## 2018-01-12 DIAGNOSIS — Z135 Encounter for screening for eye and ear disorders: Secondary | ICD-10-CM

## 2018-01-12 DIAGNOSIS — M5410 Radiculopathy, site unspecified: Secondary | ICD-10-CM

## 2018-01-12 DIAGNOSIS — F4321 Adjustment disorder with depressed mood: Secondary | ICD-10-CM | POA: Diagnosis not present

## 2018-01-12 DIAGNOSIS — I422 Other hypertrophic cardiomyopathy: Secondary | ICD-10-CM | POA: Diagnosis not present

## 2018-01-12 DIAGNOSIS — E559 Vitamin D deficiency, unspecified: Secondary | ICD-10-CM

## 2018-01-12 DIAGNOSIS — I10 Essential (primary) hypertension: Secondary | ICD-10-CM

## 2018-01-12 DIAGNOSIS — M25531 Pain in right wrist: Secondary | ICD-10-CM

## 2018-01-12 DIAGNOSIS — R269 Unspecified abnormalities of gait and mobility: Secondary | ICD-10-CM | POA: Diagnosis not present

## 2018-01-12 MED ORDER — ROSUVASTATIN CALCIUM 10 MG PO TABS: 10.0000 mg | ORAL_TABLET | Freq: Every day | ORAL | 1 refills | Status: DC

## 2018-01-12 MED ORDER — CARVEDILOL 25 MG PO TABS: 25.0000 mg | ORAL_TABLET | Freq: Two times a day (BID) | ORAL | 1 refills | Status: DC

## 2018-01-12 MED ORDER — AMLODIPINE BESYLATE 10 MG PO TABS: 10.0000 mg | ORAL_TABLET | Freq: Every day | ORAL | 1 refills | Status: DC

## 2018-01-12 MED ORDER — ZOLPIDEM TARTRATE 5 MG PO TABS: 5.0000 mg | ORAL_TABLET | Freq: Every evening | ORAL | 0 refills | Status: DC | PRN

## 2018-01-12 MED ORDER — SERTRALINE HCL 50 MG PO TABS: 50.0000 mg | ORAL_TABLET | Freq: Every day | ORAL | 3 refills | Status: DC

## 2018-01-12 MED ORDER — OMEPRAZOLE 40 MG PO CPDR: 40.0000 mg | DELAYED_RELEASE_CAPSULE | Freq: Every day | ORAL | 1 refills | Status: DC

## 2018-01-12 MED ORDER — PRADAXA 150 MG PO CAPS: 150.0000 mg | ORAL_CAPSULE | Freq: Two times a day (BID) | ORAL | 1 refills | Status: DC

## 2018-01-12 MED ORDER — IRBESARTAN 300 MG PO TABS: ORAL_TABLET | ORAL | 1 refills | Status: DC

## 2018-01-12 MED ORDER — DICLOFENAC SODIUM 1 % TD GEL: TRANSDERMAL | 0 refills | Status: DC

## 2018-01-12 NOTE — Chronic Care Management (AMB) (Signed)
  Chronic Care Management   Initial Visit Note  01/12/2018 Name: Sharon Bullock MRN: [id number] DOB: Jan 29, 1934  Referred by: Steele Sizer, MD Reason for referral : Chronic Care Management (New referral)  Subjective: "I appreciate all the help I can get"  Assessment: Sharon Bullock is an 84 year old female patient of Dr. Steele Sizer with past medical history which includes HTN, Dyslipidemia, Afib, and most recently depression related to the loss of her husband of 56 years. She is referred to the CCM program today for assistance with medication assistance/management and chronic disease management and care coordination.   Goals    . "I migiht need help paying my house payment and other bills because money is tight now" (pt-stated)     Patient reports financial difficulties and is willing to discuss resources/help with social work or case management team.   Clinical Goals: Over the next 7 days, patient will verbalize understanding of available resources for financial support.   Interventions: Consultation with [facility] LCSW     . "I need help paying for my Pradaxa" (pt-stated)     Patient states Pradaxa is her most expensive medication and she would like to speak with pharmacist about assistance  Clinical Goals: Over the next 7 days, patient will work with [facility] Pharmacist to address medication assistance needs.   Interventions: Internal referral to [facility] Pharmacist     . "I need help with depression after losing my husband" (pt-stated)     Patient states she is in grief and feeling depressed about losing her husband of 11 years recently. She began seeing a counselor via [employer] employee assistance program and is scheduled to go back for a session tomorrow. She discussed her depression with Dr. Ancil Boozer today who has prescribed Sertraline.   Clinical Goals: Over the next 14 days, patient will report ongoing work with therapist associated  with [employer] and patient will report adherence to new medication as prescribed.   Interventions: Discussed patient resources re: grief support. Scheduled follow up assessment.      . Prevent falls     Recommend to remove any items from the home that may cause slips or trips.   Plan: I will collaborate with [facility] Social Work team and will call Mrs. Mcswain next week for follow up. I will collaborate with [facility] Pharmacist re: medication assistance needs.    Ms. Esguerra was given information about Chronic Care Management services today including:  1. CCM service includes personalized support from designated clinical staff supervised by her physician, including individualized plan of care and coordination with other care providers 2. 24/7 contact phone numbers for assistance for urgent and routine care needs. 3. Service will only be billed when office clinical staff spend 20 minutes or more in a month to coordinate care. 4. Only one practitioner may furnish and bill the service in a calendar month. 5. The patient may stop CCM services at any time (effective at the end of the month) by phone call to the office staff. 6. The patient will be responsible for cost sharing (co-pay) of up to 20% of the service fee (after annual deductible is met).  Patient agreed to services and verbal consent obtained.  [facility]  [phone]

## 2018-01-12 NOTE — Progress Notes (Signed)
Name: Sharon Bullock   MRN: [id number]    DOB: Jul 29, 1933   Date:01/12/2018       Progress Note  Subjective  Chief Complaint  Chief Complaint  Patient presents with  . Medication Refill    3 month F/U  . Hypertension  . Insomnia  . Dyslipidemia  . Back Pain    Has been moving things around and getting rid of stuff since her husband passed away. She has been having back pain that radiates down her left leg.    HPI  HTN: bp is slightly elevated but stable , no orthostatic changes, chest pain, SOB or palpitation. She has mild lower extremity swelling that is stable.  Carotid atherosclerosis: found during MRI head done at [facility] at [facility] for evaluation of vertigo in 12/2015. On medical management at this time. She also has mesenteric insufficiency, she denies abdominal pain or blood in stools, no change in bowel movements. Normal appetite.Taking Crestor and Pradaxa daily   B12 deficiency with a previous history of pernicious anemia: she is receiving monthly B12 shots and has been doing well. She had another today   Afib: she sees a cardiologist - Dr. Regino Schultze at [facility]. She has been on Pradaxa, she denies palpitation, she has some edema, she deniesdecrease in exercise tolerance. Stable since last visit   Insomnia: She tried Trazodone without help, only able to sleep one hour per night,she is on Ambien 10 mg, tried Ambien 5 mg without much help,but insurance stopped paying for it, so she is back on 5 mg. She states recently noticing nocturia. She states she stopped Vesicare because was expensive, we will tried oxybutin but caused side effects and she stopped on her own.   Hypertrophic cardiomyopathy and hypertensive pulmonary vascular disease: she sees Dr. Regino Schultze, no chest pain, SOB orparoxysmal nocturnal dyspnea, the bilateral swelling is back to baseline. She is on ARB, beta-blockerand Pradaxa for afib.Stable and unchanged   Dyslipidemia: she is on Crestor and CoQ 10, reviewed labs and they  are at goal , denies side effects of medication   Thrombocytopenia: seeing hematologist- Dr. Teena Irani the past, we will recheck CBC today, MM evaluation was negative   Grieving: lost her husband April 2019, phq 9 getting worse, up to 10 now. Also in pain, she denies suicidal thoughts or ideation but willing to start medication. Discussed possible side effects. She is currently in counseling through husband's old job and will continue doing so.  Left lower back pain: going on for the past month, states affecting her gait, feels off balance at times , pain radiated initially down left lateral leg and foot but now also happens on right side. No bowel incontinence, she has a history of urinary incontinence and a little worse when she stopped medication    Patient Active Problem List   Diagnosis Date Noted  . Thrombocytopenia (HCC) 05/27/2016  . CHF (congestive heart failure) (HCC) 05/26/2016  . Mesenteric vascular insufficiency (HCC) 02/28/2016  . Pancreas cyst 02/28/2016  . Increased endometrial stripe thickness 02/28/2016  . Carotid atherosclerosis, bilateral 01/23/2016  . Hearing loss 08/17/2015  . Mitral regurgitation 04/19/2015  . Pernicious anemia 09/27/2014  . Paroxysmal A-fib (HCC) 09/27/2014  . Benign essential HTN 09/27/2014  . Perennial allergic rhinitis 09/27/2014  . Chronic anemia 09/27/2014  . Insomnia, persistent 09/27/2014  . Hypertensive pulmonary vascular disease (HCC) 09/27/2014  . B12 deficiency 09/27/2014  . Diverticulosis of colon 09/27/2014  . Dyslipidemia 09/27/2014  . Edema extremities 09/27/2014  . Gastric reflux 09/27/2014  .  Bilateral hearing loss 09/27/2014  . Polypharmacy 09/27/2014  . Pelvic pain in female 09/27/2014  . MI (mitral incompetence) 09/27/2014  . Internal hemorrhoids 09/27/2014  . Osteopenia 09/27/2014  . Arthralgia of shoulder 09/27/2014  . Finger tendinitis 09/27/2014  . Urge incontinence 09/27/2014  . Cervical prolapse 09/27/2014  .  Vertigo 09/27/2014  . Vitamin D deficiency 09/27/2014  . Bladder cystocele 04/15/2012  . Asymmetric septal hypertrophy (HCC) 05/19/2011  . Hypertrophic cardiomyopathy (HCC) 05/19/2011    Past Surgical History:  Procedure Laterality Date  . BLADDER REPAIR  2014   tact    Family History  Problem Relation Age of Onset  . Alzheimer's disease Mother   . Hypertension Mother   . Stroke Father   . Heart disease Father   . Heart disease Brother   . Hypertension Brother   . Diabetes Brother   . Cancer Daughter        breast  . Hypertension Daughter   . Breast cancer Daughter     Social History   Socioeconomic History  . Marital status: Widowed    Spouse name: Not on file  . Number of children: 5  . Years of education: Not on file  . Highest education level: Not on file  Occupational History  . Not on file  Social Needs  . Financial resource strain: Not on file  . Food insecurity:    Worry: Not on file    Inability: Not on file  . Transportation needs:    Medical: Not on file    Non-medical: Not on file  Tobacco Use  . Smoking status: Never Smoker  . Smokeless tobacco: Never Used  . Tobacco comment: smoking cessation materials not required  Substance and Sexual Activity  . Alcohol use: No    Alcohol/week: 0.0 standard drinks  . Drug use: No  . Sexual activity: Not Currently  Lifestyle  . Physical activity:    Days per week: Not on file    Minutes per session: Not on file  . Stress: Not on file  Relationships  . Social connections:    Talks on phone: Not on file    Gets together: Not on file    Attends religious service: Not on file    Active member of club or organization: Not on file    Attends meetings of clubs or organizations: Not on file    Relationship status: Not on file  . Intimate partner violence:    Fear of current or ex partner: Not on file    Emotionally abused: Not on file    Physically abused: Not on file    Forced sexual activity: Not on  file  Other Topics Concern  . Not on file  Social History Narrative   Lost husband 07/18/2017   Grown son still living with her      Current Outpatient Medications:  .  amiodarone (PACERONE) 200 MG tablet, Take 1 tablet by mouth daily., Disp: , Rfl:  .  amLODipine (NORVASC) 10 MG tablet, Take 1 tablet (10 mg total) by mouth daily., Disp: 90 tablet, Rfl: 1 .  azelastine (ASTELIN) 0.1 % nasal spray, Place 2 sprays into both nostrils 2 (two) times daily. Use in each nostril as directed, Disp: 30 mL, Rfl: 12 .  carvedilol (COREG) 25 MG tablet, Take 1 tablet (25 mg total) by mouth 2 (two) times daily with a meal., Disp: 180 tablet, Rfl: 1 .  diclofenac sodium (VOLTAREN) 1 % GEL, APPLY TO WRIST TWICE  DAILY, Disp: 100 g, Rfl: 0 .  fluticasone (FLONASE) 50 MCG/ACT nasal spray, Place 2 sprays into both nostrils daily., Disp: 16 g, Rfl: 6 .  folic acid (FOLVITE) 400 MCG tablet, Take 1 tablet by mouth daily. Reported on 04/19/2015, Disp: , Rfl:  .  irbesartan (AVAPRO) 300 MG tablet, TAKE 1 TABLET BY MOUTH EVERY DAY FOR BLOOD PRESSURE, Disp: 90 tablet, Rfl: 1 .  omeprazole (PRILOSEC) 40 MG capsule, Take 1 capsule (40 mg total) by mouth daily., Disp: 90 capsule, Rfl: 1 .  PRADAXA 150 MG CAPS capsule, Take 1 capsule (150 mg total) by mouth 2 (two) times daily., Disp: 180 capsule, Rfl: 1 .  rosuvastatin (CRESTOR) 10 MG tablet, Take 1 tablet (10 mg total) by mouth daily. With Co-Q10, Disp: 90 tablet, Rfl: 1 .  Vitamin D, Cholecalciferol, 1000 UNITS TABS, Take 1 tablet by mouth daily., Disp: , Rfl:  .  zolpidem (AMBIEN) 5 MG tablet, Take 1 tablet (5 mg total) by mouth at bedtime as needed for sleep., Disp: 90 tablet, Rfl: 0 .  sertraline (ZOLOFT) 50 MG tablet, Take 1 tablet (50 mg total) by mouth daily., Disp: 30 tablet, Rfl: 3  Current Facility-Administered Medications:  .  cyanocobalamin ((VITAMIN B-12)) injection 1,000 mcg, 1,000 mcg, Intramuscular, Q30 days, Alba Cory, MD, 1,000 mcg at 11/20/17  0944  Allergies  Allergen Reactions  . Nickel Dermatitis and Swelling  . Other Anaphylaxis    Uncoded Allergy. Allergen: SHELLFISH  . Ace Inhibitors Cough    I personally reviewed active problem list, medication list, allergies, family history, social history with the patient/caregiver today.   ROS  Constitutional: Negative for fever or weight change.  Respiratory: Negative for cough and shortness of breath.   Cardiovascular: Negative for chest pain or palpitations.  Gastrointestinal: Negative for abdominal pain, no bowel changes.  Musculoskeletal: Positive  for gait problem but no  joint swelling.  Skin: Negative for rash.  Neurological: Negative for dizziness or headache.  No other specific complaints in a complete review of systems (except as listed in HPI above).  Objective  Vitals:   01/12/18 0954  Pulse: (!) 55  Resp: 16  SpO2: 98%  Weight: 160 lb 12.8 oz (72.9 kg)  Height: 5\' 5"  (1.651 m)    Body mass index is 26.76 kg/m.  Physical Exam  Constitutional: Patient appears well-developed and well-nourished. Overweight. No distress.  HEENT: head atraumatic, normocephalic, pupils equal and reactive to light,  neck supple, throat within normal limits Cardiovascular: Normal rate, regular rhythm and normal heart sounds.  2/6 systolic  murmur heard. Trace  BLE edema. Pulmonary/Chest: Effort normal and breath sounds normal. No respiratory distress. Abdominal: Soft.  There is no tenderness. Muscular Skeletal: pain during palpation of left lower back , negative straight leg raise, using a cane Psychiatric: Patient has a normal mood and affect. behavior is normal. Judgment and thought content normal.  PHQ2/9: Depression screen [facility] 2/9 01/12/2018 10/07/2017 10/07/2017 11/25/2016 08/25/2016  Decreased Interest 2 - 1 0 0  Down, Depressed, Hopeless 1 - 1 0 0  PHQ - 2 Score 3 - 2 0 0  Altered sleeping 2 - 2 - -  Tired, decreased energy 2 - 0 - -  Change in appetite 1 - 1 - -   Feeling bad or failure about yourself  0 - 0 - -  Trouble concentrating 0 - 0 - -  Moving slowly or fidgety/restless 2 - 1 - -  Suicidal thoughts 0 - 0 - -  PHQ-9 Score 10 - 6 - -  Difficult doing work/chores Somewhat difficult (No Data) Not difficult at all - -    Fall Risk: Fall Risk  01/12/2018 10/07/2017 10/07/2017 11/25/2016 08/25/2016  Falls in the past year? No No No Yes Yes  Number falls in past yr: - - - 1 1  Injury with Fall? - - - No No  Follow up - - - Falls evaluation completed -     Functional Status Survey: Is the patient deaf or have difficulty hearing?: Yes Does the patient have difficulty seeing, even when wearing glasses/contacts?: Yes Does the patient have difficulty concentrating, remembering, or making decisions?: No Does the patient have difficulty walking or climbing stairs?: Yes Does the patient have difficulty dressing or bathing?: No Does the patient have difficulty doing errands alone such as visiting a doctor's office or shopping?: No   Assessment & Plan  1. Thrombocytopenia (HCC)  - CBC with Differential/Platelet Under the care of Dr. Smith Robert   2. Benign essential HTN  - amLODipine (NORVASC) 10 MG tablet; Take 1 tablet (10 mg total) by mouth daily.  Dispense: 90 tablet; Refill: 1 - carvedilol (COREG) 25 MG tablet; Take 1 tablet (25 mg total) by mouth 2 (two) times daily with a meal.  Dispense: 180 tablet; Refill: 1 - irbesartan (AVAPRO) 300 MG tablet; TAKE 1 TABLET BY MOUTH EVERY DAY FOR BLOOD PRESSURE  Dispense: 90 tablet; Refill: 1 - COMPLETE METABOLIC PANEL WITH GFR  3. Dyslipidemia  - rosuvastatin (CRESTOR) 10 MG tablet; Take 1 tablet (10 mg total) by mouth daily. With Co-Q10  Dispense: 90 tablet; Refill: 1  4. Mesenteric vascular insufficiency (HCC)   5. B12 deficiency  - CBC with Differential/Platelet  6. Hypertrophic cardiomyopathy (HCC)   7. Paroxysmal A-fib (HCC)  - PRADAXA 150 MG CAPS capsule; Take 1 capsule (150 mg total) by  mouth 2 (two) times daily.  Dispense: 180 capsule; Refill: 1  8. Insomnia, persistent  - zolpidem (AMBIEN) 5 MG tablet; Take 1 tablet (5 mg total) by mouth at bedtime as needed for sleep.  Dispense: 90 tablet; Refill: 0  9. Vitamin D deficiency  - vitamin D level   10. GERD without esophagitis  - omeprazole (PRILOSEC) 40 MG capsule; Take 1 capsule (40 mg total) by mouth daily.  Dispense: 90 capsule; Refill: 1  11. Grief  - sertraline (ZOLOFT) 50 MG tablet; Take 1 tablet (50 mg total) by mouth daily.  Dispense: 30 tablet; Refill: 3  12. Moderate major depression, single episode (HCC)  - sertraline (ZOLOFT) 50 MG tablet; Take 1 tablet (50 mg total) by mouth daily.  Dispense: 30 tablet; Refill: 3  14. Right wrist pain   15. Arthralgia, unspecified joint  - diclofenac sodium (VOLTAREN) 1 % GEL; APPLY TO WRIST TWICE DAILY  Dispense: 100 g; Refill: 0

## 2018-01-12 NOTE — Patient Instructions (Signed)
Someone from our CCM (Chronic Care Management) Team will call you.   Janalyn Shy MHA,BSN,RN,CCM Nurse Care Coordinator  [phone]   Ruben Reason PharmD  Clinical Pharmacist  [phone]   Ms. Brees was given information about Chronic Care Management services today including:  1. CCM service includes personalized support from designated clinical staff supervised by her physician, including individualized plan of care and coordination with other care providers 2. 24/7 contact phone numbers for assistance for urgent and routine care needs. 3. Service will only be billed when office clinical staff spend 20 minutes or more in a month to coordinate care. 4. Only one practitioner may furnish and bill the service in a calendar month. 5. The patient may stop CCM services at any time (effective at the end of the month) by phone call to the office staff. 6. The patient will be responsible for cost sharing (co-pay) of up to 20% of the service fee (after annual deductible is met).  Patient agreed to services and verbal consent obtained.

## 2018-01-12 NOTE — Patient Instructions (Signed)
[name] , Thank you for taking time to come for your Medicare Wellness Visit. I appreciate your ongoing commitment to your health goals. Please review the following plan we discussed and let me know if I can assist you in the future.   Screening recommendations/referrals: Colorectal Screening: No longer required Mammogram: No longer required Bone Density: Ordered today  Vision and Dental Exams: Recommended annual ophthalmology exams for early detection of glaucoma and other disorders of the eye Recommended annual dental exams for proper oral hygiene  Vaccinations: Influenza vaccine: Up to date Pneumococcal vaccine: Up to date Tdap vaccine: Up to date Shingles vaccine: Please call your insurance company to determine your out of pocket expense for the Shingrix vaccine. You may receive this vaccine at your local pharmacy.  Advanced directives: Please bring a copy of your POA (Power of Attorney) and/or Living Will to your next appointment.  Goals: Recommend to remove any items from the home that may cause slips or trips.  Next appointment: Please schedule your Annual Wellness Visit with your Nurse Health Advisor in one year.  Preventive Care 26 Years and Older, Female Preventive care refers to lifestyle choices and visits with your health care provider that can promote health and wellness. What does preventive care include?  A yearly physical exam. This is also called an annual well check.  Dental exams once or twice a year.  Routine eye exams. Ask your health care provider how often you should have your eyes checked.  Personal lifestyle choices, including:  Daily care of your teeth and gums.  Regular physical activity.  Eating a healthy diet.  Avoiding tobacco and drug use.  Limiting alcohol use.  Practicing safe sex.  Taking low-dose aspirin every day if recommended by your health care provider.  Taking vitamin and mineral supplements as recommended by your health  care provider. What happens during an annual well check? The services and screenings done by your health care provider during your annual well check will depend on your age, overall health, lifestyle risk factors, and family history of disease. Counseling  Your health care provider may ask you questions about your:  Alcohol use.  Tobacco use.  Drug use.  Emotional well-being.  Home and relationship well-being.  Sexual activity.  Eating habits.  History of falls.  Memory and ability to understand (cognition).  Work and work Astronomer.  Reproductive health. Screening  You may have the following tests or measurements:  Height, weight, and BMI.  Blood pressure.  Lipid and cholesterol levels. These may be checked every 5 years, or more frequently if you are over 59 years old.  Skin check.  Lung cancer screening. You may have this screening every year starting at age 52 if you have a 30-pack-year history of smoking and currently smoke or have quit within the past 15 years.  Fecal occult blood test (FOBT) of the stool. You may have this test every year starting at age 70.  Flexible sigmoidoscopy or colonoscopy. You may have a sigmoidoscopy every 5 years or a colonoscopy every 10 years starting at age 50.  Hepatitis C blood test.  Hepatitis B blood test.  Sexually transmitted disease (STD) testing.  Diabetes screening. This is done by checking your blood sugar (glucose) after you have not eaten for a while (fasting). You may have this done every 1-3 years.  Bone density scan. This is done to screen for osteoporosis. You may have this done starting at age 32.  Mammogram. This may be done  every 1-2 years. Talk to your health care provider about how often you should have regular mammograms. Talk with your health care provider about your test results, treatment options, and if necessary, the need for more tests. Vaccines  Your health care provider may recommend certain  vaccines, such as:  Influenza vaccine. This is recommended every year.  Tetanus, diphtheria, and acellular pertussis (Tdap, Td) vaccine. You may need a Td booster every 10 years.  Zoster vaccine. You may need this after age 53.  Pneumococcal 13-valent conjugate (PCV13) vaccine. One dose is recommended after age 23.  Pneumococcal polysaccharide (PPSV23) vaccine. One dose is recommended after age 20. Talk to your health care provider about which screenings and vaccines you need and how often you need them. This information is not intended to replace advice given to you by your health care provider. Make sure you discuss any questions you have with your health care provider. Document Released: 04/27/2015 Document Revised: 12/19/2015 Document Reviewed: 01/30/2015 Elsevier Interactive Patient Education  2017 [place] Prevention in the Home Falls can cause injuries. They can happen to people of all ages. There are many things you can do to make your home safe and to help prevent falls. What can I do on the outside of my home?  Regularly fix the edges of walkways and driveways and fix any cracks.  Remove anything that might make you trip as you walk through a door, such as a raised step or threshold.  Trim any bushes or trees on the path to your home.  Use bright outdoor lighting.  Clear any walking paths of anything that might make someone trip, such as rocks or tools.  Regularly check to see if handrails are loose or broken. Make sure that both sides of any steps have handrails.  Any raised decks and porches should have guardrails on the edges.  Have any leaves, snow, or ice cleared regularly.  Use sand or salt on walking paths during winter.  Clean up any spills in your garage right away. This includes oil or grease spills. What can I do in the bathroom?  Use night lights.  Install grab bars by the toilet and in the tub and shower. Do not use towel bars as grab  bars.  Use non-skid mats or decals in the tub or shower.  If you need to sit down in the shower, use a plastic, non-slip stool.  Keep the floor dry. Clean up any water that spills on the floor as soon as it happens.  Remove soap buildup in the tub or shower regularly.  Attach bath mats securely with double-sided non-slip rug tape.  Do not have throw rugs and other things on the floor that can make you trip. What can I do in the bedroom?  Use night lights.  Make sure that you have a light by your bed that is easy to reach.  Do not use any sheets or blankets that are too big for your bed. They should not hang down onto the floor.  Have a firm chair that has side arms. You can use this for support while you get dressed.  Do not have throw rugs and other things on the floor that can make you trip. What can I do in the kitchen?  Clean up any spills right away.  Avoid walking on wet floors.  Keep items that you use a lot in easy-to-reach places.  If you need to reach something above you, use a  strong step stool that has a grab bar.  Keep electrical cords out of the way.  Do not use floor polish or wax that makes floors slippery. If you must use wax, use non-skid floor wax.  Do not have throw rugs and other things on the floor that can make you trip. What can I do with my stairs?  Do not leave any items on the stairs.  Make sure that there are handrails on both sides of the stairs and use them. Fix handrails that are broken or loose. Make sure that handrails are as long as the stairways.  Check any carpeting to make sure that it is firmly attached to the stairs. Fix any carpet that is loose or worn.  Avoid having throw rugs at the top or bottom of the stairs. If you do have throw rugs, attach them to the floor with carpet tape.  Make sure that you have a light switch at the top of the stairs and the bottom of the stairs. If you do not have them, ask someone to add them for  you. What else can I do to help prevent falls?  Wear shoes that:  Do not have high heels.  Have rubber bottoms.  Are comfortable and fit you well.  Are closed at the toe. Do not wear sandals.  If you use a stepladder:  Make sure that it is fully opened. Do not climb a closed stepladder.  Make sure that both sides of the stepladder are locked into place.  Ask someone to hold it for you, if possible.  Clearly mark and make sure that you can see:  Any grab bars or handrails.  First and last steps.  Where the edge of each step is.  Use tools that help you move around (mobility aids) if they are needed. These include:  Canes.  Walkers.  Scooters.  Crutches.  Turn on the lights when you go into a dark area. Replace any light bulbs as soon as they burn out.  Set up your furniture so you have a clear path. Avoid moving your furniture around.  If any of your floors are uneven, fix them.  If there are any pets around you, be aware of where they are.  Review your medicines with your doctor. Some medicines can make you feel dizzy. This can increase your chance of falling. Ask your doctor what other things that you can do to help prevent falls. This information is not intended to replace advice given to you by your health care provider. Make sure you discuss any questions you have with your health care provider. Document Released: 01/25/2009 Document Revised: 09/06/2015 Document Reviewed: 05/05/2014 Elsevier Interactive Patient Education  2017 [employer].

## 2018-01-12 NOTE — Progress Notes (Signed)
Subjective:   Sharon Bullock is a 84 y.o. female who presents for an Initial Medicare Annual Wellness Visit.  Review of Systems    N/A  Cardiac Risk Factors include: advanced age (>66men, >51 women);dyslipidemia;hypertension;sedentary lifestyle     Objective:    Today's Vitals   01/12/18 1039 01/12/18 1040  BP: (!) 146/60   Pulse: (!) 55   Resp: 16   Temp: 97.6 F (36.4 C)   TempSrc: Oral   SpO2: 98%   Weight: 160 lb (72.6 kg)   Height: 5\' 5"  (1.651 m)   PainSc:  7    Body mass index is 26.63 kg/m.  Advanced Directives 01/12/2018 12/11/2016 11/25/2016 08/25/2016 07/10/2016 06/19/2016 05/26/2016  Does Patient Have a Medical Advance Directive? Yes Yes Yes Yes Yes Yes Yes  Type of Estate agent of [place];Living will Living will Healthcare Power of [place];Living will Healthcare Power of [place];Living will Living will - Healthcare Power of [place];Living will  Does patient want to make changes to medical advance directive? - - - - - - -  Copy of Healthcare Power of Attorney in Chart? No - copy requested - - No - copy requested - - -    Current Medications (verified) Outpatient Encounter Medications as of 01/12/2018  Medication Sig  . amiodarone (PACERONE) 200 MG tablet Take 1 tablet by mouth daily.  Marland Kitchen amLODipine (NORVASC) 10 MG tablet Take 1 tablet (10 mg total) by mouth daily.  Marland Kitchen azelastine (ASTELIN) 0.1 % nasal spray Place 2 sprays into both nostrils 2 (two) times daily. Use in each nostril as directed  . carvedilol (COREG) 25 MG tablet Take 1 tablet (25 mg total) by mouth 2 (two) times daily with a meal.  . diclofenac sodium (VOLTAREN) 1 % GEL APPLY TO WRIST TWICE DAILY  . fluticasone (FLONASE) 50 MCG/ACT nasal spray Place 2 sprays into both nostrils daily.  . folic acid (FOLVITE) 400 MCG tablet Take 1 tablet by mouth daily. Reported on 04/19/2015  . irbesartan (AVAPRO) 300 MG tablet TAKE 1 TABLET BY MOUTH EVERY DAY FOR BLOOD PRESSURE  . omeprazole  (PRILOSEC) 40 MG capsule Take 1 capsule (40 mg total) by mouth daily.  Marland Kitchen PRADAXA 150 MG CAPS capsule Take 1 capsule (150 mg total) by mouth 2 (two) times daily.  . rosuvastatin (CRESTOR) 10 MG tablet Take 1 tablet (10 mg total) by mouth daily. With Co-Q10  . sertraline (ZOLOFT) 50 MG tablet Take 1 tablet (50 mg total) by mouth daily.  . Vitamin D, Cholecalciferol, 1000 UNITS TABS Take 1 tablet by mouth daily.  Marland Kitchen zolpidem (AMBIEN) 5 MG tablet Take 1 tablet (5 mg total) by mouth at bedtime as needed for sleep.  . [DISCONTINUED] diclofenac sodium (VOLTAREN) 1 % GEL APPLY TO WRIST TWICE DAILY   Facility-Administered Encounter Medications as of 01/12/2018  Medication  . cyanocobalamin ((VITAMIN B-12)) injection 1,000 mcg    Allergies (verified) Nickel; Other; and Ace inhibitors   History: Past Medical History:  Diagnosis Date  . Allergy   . Enlarged heart 2000  . GERD (gastroesophageal reflux disease)   . Hypertension   . Insomnia   . Vertigo 2006   Past Surgical History:  Procedure Laterality Date  . BLADDER REPAIR  2014   tact   Family History  Problem Relation Age of Onset  . Alzheimer's disease Mother   . Hypertension Mother   . Stroke Father   . Heart disease Father   . Heart disease Brother   .  Hypertension Brother   . Cancer Daughter        breast  . Hypertension Daughter   . Breast cancer Daughter   . Diabetes Brother   . Heart disease Brother   . Heart disease Brother   . Diabetes Brother   . Hypertension Brother   . Heart disease Brother   . Hypertension Brother   . Cancer Brother        prostate  . Diabetes Brother   . Heart disease Brother   . Hypertension Brother   . Cancer Brother        prostate   Social History   Socioeconomic History  . Marital status: Widowed    Spouse name: Not on file  . Number of children: 5  . Years of education: some college  . Highest education level: 12th grade  Occupational History  . Occupation: Retired  Sports coach  . Financial resource strain: Not hard at all  . Food insecurity:    Worry: Never true    Inability: Never true  . Transportation needs:    Medical: No    Non-medical: No  Tobacco Use  . Smoking status: Never Smoker  . Smokeless tobacco: Never Used  . Tobacco comment: smoking cessation materials not required  Substance and Sexual Activity  . Alcohol use: No    Alcohol/week: 0.0 standard drinks  . Drug use: No  . Sexual activity: Not Currently  Lifestyle  . Physical activity:    Days per week: 0 days    Minutes per session: 0 min  . Stress: To some extent  Relationships  . Social connections:    Talks on phone: Patient refused    Gets together: Patient refused    Attends religious service: Patient refused    Active member of club or organization: Patient refused    Attends meetings of clubs or organizations: Patient refused    Relationship status: Widowed  Other Topics Concern  . Not on file  Social History Narrative   Lost husband 07/18/2017   Grown son still living with her     Tobacco Counseling Counseling given: No Comment: smoking cessation materials not required  Clinical Intake:  Pre-visit preparation completed: Yes  Pain : 0-10 Pain Score: 7 (New pain addressed by Dr. Carlynn Purl today. Referral placed to ortho and PT)   BMI - recorded: 26.63 Nutritional Status: BMI 25 -29 Overweight Nutritional Risks: None Diabetes: No  How often do you need to have someone help you when you read instructions, pamphlets, or other written materials from your doctor or pharmacy?: 1 - Never  Interpreter Needed?: No  Information entered by :: AEversole, LPN   Activities of Daily Living In your present state of health, do you have any difficulty performing the following activities: 01/12/2018 01/12/2018  Hearing? N Y  Comment denies hearing aids -  Vision? N Y  Comment wears eyeglasses -  Difficulty concentrating or making decisions? N N  Walking or climbing  stairs? Y Y  Comment L extremity pain -  Dressing or bathing? N N  Doing errands, shopping? N N  Preparing Food and eating ? N -  Comment denies dentures -  Using the Toilet? N -  In the past six months, have you accidently leaked urine? Y -  Comment urgency -  Do you have problems with loss of bowel control? N -  Managing your Medications? N -  Managing your Finances? N -  Housekeeping or managing your Housekeeping? N -  Some recent data might be hidden     Immunizations and Health Maintenance Immunization History  Administered Date(s) Administered  . Influenza Nasal 01/13/2016  . Influenza Split 04/13/2008, 01/18/2009, 12/03/2009  . Influenza, High Dose Seasonal PF 01/03/2015, 01/23/2016, 01/01/2017, 12/18/2017  . Influenza, Seasonal, Injecte, Preservative Fre 12/25/2010, 01/19/2012  . Influenza,inj,Quad PF,6+ Mos 12/27/2012, 01/24/2014  . Influenza-Unspecified 12/25/2010, 12/27/2012  . Pneumococcal Conjugate-13 01/24/2014, 04/19/2015  . Pneumococcal Polysaccharide-23 08/27/2009, 01/24/2014  . Td 08/27/2009  . Tdap 08/27/2009   There are no preventive care reminders to display for this patient.  Patient Care Team: Alba Cory, MD as PCP - General (Family Medicine) Nolon Rod, MD as Consulting Physician (Internal Medicine) Nunzio Cory, RN as Case Manager  Indicate any recent Medical Services you may have received from other than [facility] providers in the past year (date may be approximate).     Assessment:   This is a routine wellness examination for Shawana.  Hearing/Vision screen Vision Screening Comments: Sees [facility] for annual eye exams  Dietary issues and exercise activities discussed: Current Exercise Habits: The patient does not participate in regular exercise at present, Exercise limited by: None identified  Goals    . Prevent falls     Recommend to remove any items from the home that may cause slips or trips.      Depression Screen PHQ 2/9  Scores 01/12/2018 01/12/2018 10/07/2017 11/25/2016 08/25/2016 05/26/2016 01/23/2016  PHQ - 2 Score 4 3 2  0 0 0 0  PHQ- 9 Score 12 10 6  - - - -    Fall Risk Fall Risk  01/12/2018 01/12/2018 10/07/2017 10/07/2017 11/25/2016  Falls in the past year? No No No No Yes  Number falls in past yr: - - - - 1  Injury with Fall? - - - - No  Risk for fall due to : Impaired vision;Impaired balance/gait;Medication side effect;History of fall(s);Mental status change - - - -  Risk for fall due to: Comment ambualtes with cane, new pain to L extremity resulting in referral to ortho; wears eyeglasses; recent depression resulting in start of Zoloft - - - -  Follow up - - - - Falls evaluation completed    FALL RISK PREVENTION PERTAINING TO THE HOME:  Any stairs in or around the home WITH handrails? Yes  Home free of loose throw rugs in walkways, pet beds, electrical cords, etc? Yes  Adequate lighting in your home to reduce risk of falls? Yes   ASSISTIVE DEVICES UTILIZED TO PREVENT FALLS:  Life alert? Yes  Use of a cane, walker or w/c? Yes, cane Grab bars in the bathroom? No  Shower chair or bench in shower? Yes  Elevated toilet seat or a handicapped toilet? No   DME ORDERS:  DME order needed?  No   TIMED UP AND GO:  Was the test performed? Yes .  Length of time to ambulate 10 feet: 32 sec.   GAIT:  Appearance of gait: Gait slow, steady, with a slight limp and with the use of an assistive device.  Education: Fall risk prevention has been discussed.  Intervention(s) required? Yes. Pt requesting to be seen by PT for strengthening, balance and gait retraining. Referral placed for pt to establish care with PT services for evaluation of gait, balance, coordination and strengthening. Aware she will receive a call re: set up of services.  Cognitive Function:     6CIT Screen 01/12/2018  What Year? 0 points  What month? 0 points  What time?  0 points  Count back from 20 0 points  Months in reverse 0 points    Repeat phrase 2 points  Total Score 2    Screening Tests Health Maintenance  Topic Date Due  . TETANUS/TDAP  08/28/2019  . INFLUENZA VACCINE  Completed  . DEXA SCAN  Completed  . PNA vac Low Risk Adult  Completed    Qualifies for Shingles Vaccine? Yes . Due for Shingrix. Education has been provided regarding the importance of this vaccine. Pt has been advised to call insurance company to determine out of pocket expense. Advised may also receive vaccine at local pharmacy or Health Dept. Verbalized acceptance and understanding.  Tdap: Up to date  Flu Vaccine: Up to date  Pneumococcal Vaccine: Up to date  Cancer Screenings:  Colorectal Screening: No longer required  Mammogram: No longer required  Bone Density: Completed 04/14/09. Ordered today. Pt provided with contact info and advised to call to schedule appt. Pt aware the office will call re: appt.  Lung Cancer Screening: (Low Dose CT Chest recommended if Age 27-80 years, 30 pack-year currently smoking OR have quit w/in 15years.) does not qualify.   Additional Screening:  Hepatitis C Screening: does not qualify  Vision Screening: Recommended annual ophthalmology exams for early detection of glaucoma and other disorders of the eye. Is the patient up to date with their annual eye exam?  No  Who is the provider or what is the name of the office in which the pt attends annual eye exams? [facility] Would the pt like to be referred for an updated eye exam? Yes . Ophthalmology referral has been placed. Pt aware the office will call re: appt.  Dental Screening: Recommended annual dental exams for proper oral hygiene  Community Resource Referral:  CRR required this visit?  No    Plan:  I have personally reviewed and addressed the Medicare Annual Wellness questionnaire and have noted the following in the patient's chart:  A. Medical and social history B. Use of alcohol, tobacco or illicit drugs  C. Current medications and  supplements D. Functional ability and status E.  Nutritional status F.  Physical activity G. Advance directives H. List of other physicians I.  Hospitalizations, surgeries, and ER visits in previous 12 months J.  Vitals K. Screenings such as hearing and vision if needed, cognitive and depression L. Referrals and appointments  In addition, I have reviewed and discussed with patient certain preventive protocols, quality metrics, and best practice recommendations. A written personalized care plan for preventive services as well as general preventive health recommendations were provided to patient.  See attached scanned questionnaire for additional information.   Signed,  Deon Pilling, LPN Nurse Health Advisor

## 2018-01-13 LAB — COMPLETE METABOLIC PANEL WITH GFR
AG Ratio: 1.4 (calc) (ref 1.0–2.5)
ALT: 13 U/L (ref 6–29)
AST: 20 U/L (ref 10–35)
Albumin: 4 g/dL (ref 3.6–5.1)
Alkaline phosphatase (APISO): 63 U/L (ref 33–130)
BUN/Creatinine Ratio: 12 (calc) (ref 6–22)
BUN: 11 mg/dL (ref 7–25)
CO2: 24 mmol/L (ref 20–32)
CREATININE: 0.93 mg/dL — AB (ref 0.60–0.88)
Calcium: 9.1 mg/dL (ref 8.6–10.4)
Chloride: 106 mmol/L (ref 98–110)
GFR, EST NON AFRICAN AMERICAN: 57 mL/min/{1.73_m2} — AB (ref >=60)
GFR, Est African American: 66 mL/min/{1.73_m2} (ref >=60)
GLOBULIN: 2.8 g/dL (ref 1.9–3.7)
Glucose, Bld: 102 mg/dL (ref 65–139)
Potassium: 3.8 mmol/L (ref 3.5–5.3)
Sodium: 139 mmol/L (ref 135–146)
Total Bilirubin: 0.3 mg/dL (ref 0.2–1.2)
Total Protein: 6.8 g/dL (ref 6.1–8.1)

## 2018-01-13 LAB — CBC WITH DIFFERENTIAL/PLATELET
BASOS PCT: 0.4 %
Basophils Absolute: 20 cells/uL (ref 0–200)
EOS ABS: 82 {cells}/uL (ref 15–500)
Eosinophils Relative: 1.6 %
HEMATOCRIT: 32.7 % — AB (ref 35.0–45.0)
HEMOGLOBIN: 10.8 g/dL — AB (ref 11.7–15.5)
Lymphs Abs: 1295 cells/uL (ref 850–3900)
MCH: 30.5 pg (ref 27.0–33.0)
MCHC: 33 g/dL (ref 32.0–36.0)
MCV: 92.4 fL (ref 80.0–100.0)
MPV: 11.4 fL (ref 7.5–12.5)
Monocytes Relative: 8.9 %
NEUTROS ABS: 3249 {cells}/uL (ref 1500–7800)
Neutrophils Relative %: 63.7 %
Platelets: 140 10*3/uL (ref 140–400)
RBC: 3.54 10*6/uL — ABNORMAL LOW (ref 3.80–5.10)
RDW: 12.5 % (ref 11.0–15.0)
Total Lymphocyte: 25.4 %
WBC: 5.1 10*3/uL (ref 3.8–10.8)
WBCMIX: 454 {cells}/uL (ref 200–950)

## 2018-01-13 LAB — VITAMIN B12: VITAMIN B 12: 386 pg/mL (ref 200–1100)

## 2018-01-13 LAB — VITAMIN D 25 HYDROXY (VIT D DEFICIENCY, FRACTURES): VIT D 25 HYDROXY: 42 ng/mL (ref 30–100)

## 2018-01-15 ENCOUNTER — Ambulatory Visit: Payer: Self-pay | Admitting: *Deleted

## 2018-01-15 DIAGNOSIS — E785 Hyperlipidemia, unspecified: Secondary | ICD-10-CM

## 2018-01-15 DIAGNOSIS — I10 Essential (primary) hypertension: Secondary | ICD-10-CM

## 2018-01-15 DIAGNOSIS — F321 Major depressive disorder, single episode, moderate: Secondary | ICD-10-CM

## 2018-01-15 NOTE — Chronic Care Management (AMB) (Signed)
  Chronic Care Management   Follow Up Note   01/15/2018 Name: Sharon Bullock MRN: [id number] DOB: May 11, 1933  Referred by: Alba Cory, MD Reason for referral : No chief complaint on file.  Assessment: Sharon Bullock is an 84 year old female patient of Dr. Alba Cory with past medical history which includes HTN, Dyslipidemia, Afib, and most recently depression related to the loss of her husband of 59 years. She is referred to the CCM program  for assistance with medication assistance/management and chronic disease management and care coordination.   I made an unsuccessful outreach attempt to Sharon Bullock today to follow up on social work recommendations made in response to an inquiry about financial support.   Goals Addressed    . "I migiht need help paying my house payment and other bills because money is tight now" (pt-stated)       Patient reports financial difficulties and is willing to discuss resources/help with social work or case management team.   Social Work Recommendations:   Nurse, adult Counseling [phone])- assist with budgeting, debt management, mortgage [employer] farm manager [employer] [place] [phone]; [facility] [phone]) - assist with debt management, budgeting  Contact personal mortgage company to filed for hardship due to death of spouse  [facility] [phone]) - immediate limited short term financial support if patient qualifies   Clinical Goals: Over the next 7 days, patient will verbalize understanding of available resources for financial support.   Interventions: Consultation with [facility] Community Care Management LCSW, outreach to patient/son to provide contact information     Plan: I will follow up with Sharon Bullock by phone again next week.  Marja Kays MHA,BSN,RN,CCM Nurse Care Coordinator [facility] Care Management  [phone]

## 2018-01-18 ENCOUNTER — Ambulatory Visit: Payer: Medicare Other

## 2018-01-18 DIAGNOSIS — I10 Essential (primary) hypertension: Secondary | ICD-10-CM

## 2018-01-18 DIAGNOSIS — F321 Major depressive disorder, single episode, moderate: Secondary | ICD-10-CM

## 2018-01-18 NOTE — Chronic Care Management (AMB) (Signed)
  Chronic Care Management   Follow Up Note   01/18/2018 Name: Sharon Bullock MRN: [id number] DOB: Sep 09, 1933  Referred by: Sharon Cory, MD Reason for referral : Chronic Care Management  Subjective: "I appreciate all the help I can get"  Assessment: [name] is an 84 year old female patient of Dr. Alba Bullock with past medical history which includes HTN, Dyslipidemia, Afib, and most recently depression related to the loss of her husband of 59 years. She is referred to the CCM program  for assistance with medication assistance/management and chronic disease management and care coordination.   I reached out to Sharon Bullock by phone today to follow up on items as outlined below.   Goals Addressed    . "I migiht need help paying my house payment and other bills because money is tight now" (pt-stated)       Patient reports financial difficulties and is willing to discuss resources/help with social work or case management team.   Social Work Recommendations:   Magazine features editor [phone])- assist with budgeting, debt management, mortgage Risk manager [phone]); [place] [facility] [phone]) - assist with debt management, budgeting  Contact personal mortgage company to filed for hardship due to death of spouse  [facility] [phone]) - immediate limited short term financial support if patient qualifies   Clinical Goals: Over the next 7 days, patient will reach out to organizations listed above to inquire about  available resources for financial support.   Interventions: Consultation with Sharon Bullock Community Care Management LCSW, outreach to patient/son to provide contact information   Plan: I will follow up with Sharon Bullock by phone in a week to inquire about her success reaching agencies as recommended above.   Sharon Bullock MHA,BSN,RN,CCM Nurse Care  Coordinator [facility] / [facility] Care Management  [phone]

## 2018-01-20 NOTE — Patient Instructions (Signed)
Reach out to the following agencies as recommended by the social worker:    Nurse, adult Counseling [phone])- assist with budgeting, debt management, mortgage payment assistance   [employer] [phone]); [facility] [phone]) - assist with debt management, budgeting  Contact personal mortgage company to filed for hardship due to death of spouse  [facility] [phone]) - immediate limited short term financial support if patient qualifies   Call your nurse case manager if you have difficulties or questions about how to contact these agencies:  Marja Kays [facility] Nurse Care Coordinator [facility] / [facility] Care Management  [phone]

## 2018-01-26 DIAGNOSIS — H2513 Age-related nuclear cataract, bilateral: Secondary | ICD-10-CM | POA: Diagnosis not present

## 2018-01-28 ENCOUNTER — Ambulatory Visit: Payer: Medicare Other

## 2018-01-28 NOTE — Chronic Care Management (AMB) (Signed)
  Chronic Care Management   Note  01/28/2018 Name: Damisha Heiden MRN: [id number] DOB: [birth year]  Unable to reach Ms. Klebba by phone today to follow up on chronic disease management and care coordination needs.   Plan: I will reach out to Mrs. Chamorro next week.   Marja Kays MHA,BSN,RN,CCM Nurse Care Coordinator [facility] / [facility] Care Management  [phone]

## 2018-02-18 ENCOUNTER — Telehealth: Payer: Self-pay

## 2018-02-18 ENCOUNTER — Ambulatory Visit: Payer: Self-pay | Admitting: Pharmacist

## 2018-02-18 NOTE — Chronic Care Management (AMB) (Signed)
  Chronic Care Management   Note  02/18/2018 Name: Sharon Bullock MRN: [id number] DOB: 1934/03/13  84 y.o. year old female referred to Chronic Care Management by Dr. Carlynn Purl for community resources and medication assistance. Chronic conditions include paroxysmal a fib, HTN, CHF. Last office visit with Alba Cory, MD was 01/12/18   Was unable to reach patient via telephone today and unfortunately voicemail has not been set up. Call was disconnected by automaton after >15 rings (unsuccessful outreach #1).  Plan: Will follow-up within 3-5  business days via telephone.   Karalee Height, PharmD Clinical Pharmacist [facility] [phone]

## 2018-02-23 ENCOUNTER — Telehealth: Payer: Self-pay

## 2018-02-23 ENCOUNTER — Ambulatory Visit: Payer: Self-pay | Admitting: Pharmacist

## 2018-02-23 ENCOUNTER — Other Ambulatory Visit: Payer: Self-pay | Admitting: Family Medicine

## 2018-02-23 DIAGNOSIS — I10 Essential (primary) hypertension: Secondary | ICD-10-CM

## 2018-02-23 NOTE — Chronic Care Management (AMB) (Signed)
  Chronic Care Management   Note  02/23/2018 Name: Sharon Bullock MRN: [id number] DOB: 06-Mar-1934    84 y.o. year old female referred to Chronic Care Management by Dr. Carlynn Purl for community resources and medication assistance. Chronic conditions include paroxysmal afib, HTN, CHF. Last office visit with Alba Cory, MD was 01/12/18.   Reaching out to patient regarding Pradaxa assistance.   Was unable to reach patient via telephone today. Unfortunately, voicemail had not been set up and call was disconnected after >15 rings. (unsuccessful outreach #2).  Plan: Will follow-up within 3-5  business days via telephone.   Karalee Height, PharmD Clinical Pharmacist [facility] [phone]

## 2018-03-02 ENCOUNTER — Telehealth: Payer: Self-pay

## 2018-03-04 ENCOUNTER — Encounter: Payer: Self-pay | Admitting: Family Medicine

## 2018-03-04 ENCOUNTER — Ambulatory Visit (INDEPENDENT_AMBULATORY_CARE_PROVIDER_SITE_OTHER): Payer: Medicare Other | Admitting: Family Medicine

## 2018-03-04 ENCOUNTER — Ambulatory Visit: Payer: Self-pay | Admitting: Pharmacist

## 2018-03-04 ENCOUNTER — Telehealth: Payer: Self-pay

## 2018-03-04 VITALS — BP 140/80 | HR 64 | Temp 97.9°F | Resp 14 | Ht 65.0 in | Wt 165.5 lb

## 2018-03-04 DIAGNOSIS — I480 Paroxysmal atrial fibrillation: Secondary | ICD-10-CM

## 2018-03-04 DIAGNOSIS — E538 Deficiency of other specified B group vitamins: Secondary | ICD-10-CM

## 2018-03-04 DIAGNOSIS — M8580 Other specified disorders of bone density and structure, unspecified site: Secondary | ICD-10-CM

## 2018-03-04 DIAGNOSIS — M5410 Radiculopathy, site unspecified: Secondary | ICD-10-CM

## 2018-03-04 NOTE — Progress Notes (Signed)
Name: Sharon Bullock   MRN: [id number]    DOB: 12-24-1933   Date:03/04/2018       Progress Note  Subjective  Chief Complaint  Chief Complaint  Patient presents with  . Back Pain    strained her back while lifting, pain now radiates down legs    HPI  Pt in c/o lower back pain that radiates down bilateral legs, pt reports bilateral leg weakness and pain during ambulation. Pt states she is afraid her "legs will go out".  Pt is requesting a referral to an orthopedic with [facility], states she got a referral at her last visit and when she contacted them they said they were unable to see her and she needed to come back here for a new referral. Pt reports the pain has been going on and has gotten progressively worse, she has not been taking any medication at home. Symptoms are worsened when she is sitting and has to start to stand, improve after walking for awhile- she reports she is having trouble lifting her feet to go up steps.  Patient Active Problem List   Diagnosis Date Noted  . Thrombocytopenia (HCC) 05/27/2016  . CHF (congestive heart failure) (HCC) 05/26/2016  . Mesenteric vascular insufficiency (HCC) 02/28/2016  . Pancreas cyst 02/28/2016  . Increased endometrial stripe thickness 02/28/2016  . Carotid atherosclerosis, bilateral 01/23/2016  . Hearing loss 08/17/2015  . Mitral regurgitation 04/19/2015  . Pernicious anemia 09/27/2014  . Paroxysmal A-fib (HCC) 09/27/2014  . Benign essential HTN 09/27/2014  . Perennial allergic rhinitis 09/27/2014  . Chronic anemia 09/27/2014  . Insomnia, persistent 09/27/2014  . Hypertensive pulmonary vascular disease (HCC) 09/27/2014  . B12 deficiency 09/27/2014  . Diverticulosis of colon 09/27/2014  . Dyslipidemia 09/27/2014  . Edema extremities 09/27/2014  . Gastric reflux 09/27/2014  . Bilateral hearing loss 09/27/2014  . Polypharmacy 09/27/2014  . Pelvic pain in female 09/27/2014  . MI (mitral incompetence) 09/27/2014  . Internal  hemorrhoids 09/27/2014  . Osteopenia 09/27/2014  . Arthralgia of shoulder 09/27/2014  . Finger tendinitis 09/27/2014  . Urge incontinence 09/27/2014  . Cervical prolapse 09/27/2014  . Vertigo 09/27/2014  . Vitamin D deficiency 09/27/2014  . Bladder cystocele 04/15/2012  . Asymmetric septal hypertrophy (HCC) 05/19/2011  . Hypertrophic cardiomyopathy (HCC) 05/19/2011    Social History   Tobacco Use  . Smoking status: Never Smoker  . Smokeless tobacco: Never Used  . Tobacco comment: smoking cessation materials not required  Substance Use Topics  . Alcohol use: No    Alcohol/week: 0.0 standard drinks     Current Outpatient Medications:  .  amiodarone (PACERONE) 200 MG tablet, Take 1 tablet by mouth daily., Disp: , Rfl:  .  amLODipine (NORVASC) 10 MG tablet, Take 1 tablet (10 mg total) by mouth daily., Disp: 90 tablet, Rfl: 1 .  azelastine (ASTELIN) 0.1 % nasal spray, Place 2 sprays into both nostrils 2 (two) times daily. Use in each nostril as directed, Disp: 30 mL, Rfl: 12 .  carvedilol (COREG) 25 MG tablet, Take 1 tablet (25 mg total) by mouth 2 (two) times daily with a meal., Disp: 180 tablet, Rfl: 1 .  diclofenac sodium (VOLTAREN) 1 % GEL, APPLY TO WRIST TWICE DAILY, Disp: 100 g, Rfl: 0 .  fluticasone (FLONASE) 50 MCG/ACT nasal spray, Place 2 sprays into both nostrils daily., Disp: 16 g, Rfl: 6 .  folic acid (FOLVITE) 400 MCG tablet, Take 1 tablet by mouth daily. Reported on 04/19/2015, Disp: , Rfl:  .  irbesartan (AVAPRO) 300 MG tablet, TAKE 1 TABLET BY MOUTH EVERY DAY FOR BLOOD PRESSURE, Disp: 90 tablet, Rfl: 1 .  omeprazole (PRILOSEC) 40 MG capsule, Take 1 capsule (40 mg total) by mouth daily., Disp: 90 capsule, Rfl: 1 .  PRADAXA 150 MG CAPS capsule, Take 1 capsule (150 mg total) by mouth 2 (two) times daily., Disp: 180 capsule, Rfl: 1 .  rosuvastatin (CRESTOR) 10 MG tablet, Take 1 tablet (10 mg total) by mouth daily. With Co-Q10, Disp: 90 tablet, Rfl: 1 .  sertraline (ZOLOFT)  50 MG tablet, Take 1 tablet (50 mg total) by mouth daily., Disp: 30 tablet, Rfl: 3 .  Vitamin D, Cholecalciferol, 1000 UNITS TABS, Take 1 tablet by mouth daily., Disp: , Rfl:  .  zolpidem (AMBIEN) 5 MG tablet, Take 1 tablet (5 mg total) by mouth at bedtime as needed for sleep., Disp: 90 tablet, Rfl: 0  Current Facility-Administered Medications:  .  cyanocobalamin ((VITAMIN B-12)) injection 1,000 mcg, 1,000 mcg, Intramuscular, Q30 days, Alba Cory, MD, 1,000 mcg at 11/20/17 0944  Allergies  Allergen Reactions  . Nickel Dermatitis and Swelling  . Other Anaphylaxis    Uncoded Allergy. Allergen: SHELLFISH  . Ace Inhibitors Cough    I personally reviewed active problem list, medication list, allergies, notes from last encounter with the patient/caregiver today.  ROS  Ten systems reviewed and is negative except as mentioned in HPI.   Objective  Vitals:   03/04/18 1127  BP: 140/80  Pulse: 64  Resp: 14  Temp: 97.9 F (36.6 C)  TempSrc: Oral  SpO2: 99%  Weight: 165 lb 8 oz (75.1 kg)  Height: 5\' 5"  (1.651 m)   Body mass index is 27.54 kg/m.  Nursing Note and Vital Signs reviewed.  Physical Exam  Constitutional: Patient appears well-developed and well-nourished. No distress.  HENT: Head: Normocephalic and atraumatic.  Eyes: Conjunctivae and EOM are normal. Pupils are equal, round, and reactive to light. No scleral icterus.  Neck: Normal range of motion. Neck supple. No JVD present. No thyromegaly present.  Cardiovascular: Normal rate, regular rhythm and normal heart sounds.  No murmur heard. No BLE edema. Pulmonary/Chest: Effort normal and breath sounds normal. No respiratory distress. Abdominal: Soft. Bowel sounds are normal, no distension. There is no tenderness. no masses Musculoskeletal: Normal range of motion, no joint effusions. No gross deformities. There is tenderness along the LEFT lower back. Neurological: she is alert and oriented to person, place, and time. No  cranial nerve deficit. Coordination, balance, strength, speech and gait are normal.  Skin: Skin is warm and dry. No rash noted. No erythema.  Psychiatric: Patient has a normal mood and affect. behavior is normal. Judgment and thought content normal.   No results found for this or any previous visit (from the past 72 hour(s)).  Assessment & Plan  1. Radiculitis with lower extremity symptoms 2. Osteopenia, unspecified location Personally contacted Melissa, referral coordinator, who will reach out to [facility] and obtain additional information on referral process for appropriate referral. Recommend patient take tylenol PRN for pain, and Melissa will reach out this afternoon.

## 2018-03-04 NOTE — Patient Instructions (Signed)
Please take Tylenol as needed for pain. Melissa, our referrals coordinator, will be calling you this afternoon with an update on your referral.

## 2018-03-04 NOTE — Chronic Care Management (AMB) (Signed)
  Chronic Care Management   Note  03/04/2018 Name: Sharon Bullock MRN: [id number] DOB: 1933-04-23   Chronic Care Management   Note  03/04/2018 Name: Sharon Bullock MRN: [id number] DOB: [birth year]  84 y.o. year old female referred to Chronic Care Management pharmacist for medication assistance (Pradaxa) by Dr. Jonna Coup.   CCM team services are being closed due to three unsuccessful outreach attempts. However, patient previously successfully engaged with CCM nurse care manager Marja Kays.   Patient has been provided CCM contact information if he/she wishes to engage with care managers in the future.   Karalee Height, PharmD Clinical Pharmacist [facility] [phone]

## 2018-03-19 ENCOUNTER — Telehealth: Payer: Self-pay | Admitting: Family Medicine

## 2018-03-19 NOTE — Telephone Encounter (Signed)
Pt is completely out of Amiodarone 200mg . She is out early because she was told by the hospital to start taking a whole tablet instead of the 1/2 tablet. Please send to [facility]

## 2018-03-19 NOTE — Telephone Encounter (Signed)
Pt will have her cardiologist at [facility] take care of amiodarone 200 mg refill

## 2018-03-24 DIAGNOSIS — M545 Low back pain: Secondary | ICD-10-CM | POA: Diagnosis not present

## 2018-03-24 DIAGNOSIS — M47816 Spondylosis without myelopathy or radiculopathy, lumbar region: Secondary | ICD-10-CM | POA: Diagnosis not present

## 2018-03-24 DIAGNOSIS — M5416 Radiculopathy, lumbar region: Secondary | ICD-10-CM | POA: Diagnosis not present

## 2018-03-26 ENCOUNTER — Other Ambulatory Visit: Payer: Self-pay | Admitting: Family Medicine

## 2018-03-26 DIAGNOSIS — E785 Hyperlipidemia, unspecified: Secondary | ICD-10-CM

## 2018-04-19 ENCOUNTER — Ambulatory Visit: Payer: Medicare Other | Attending: Physician Assistant | Admitting: Physical Therapy

## 2018-04-19 ENCOUNTER — Other Ambulatory Visit: Payer: Self-pay

## 2018-04-19 DIAGNOSIS — M545 Low back pain: Secondary | ICD-10-CM | POA: Insufficient documentation

## 2018-04-19 DIAGNOSIS — R2689 Other abnormalities of gait and mobility: Secondary | ICD-10-CM | POA: Diagnosis not present

## 2018-04-19 DIAGNOSIS — G8929 Other chronic pain: Secondary | ICD-10-CM | POA: Insufficient documentation

## 2018-04-19 DIAGNOSIS — M6281 Muscle weakness (generalized): Secondary | ICD-10-CM | POA: Insufficient documentation

## 2018-04-19 DIAGNOSIS — R262 Difficulty in walking, not elsewhere classified: Secondary | ICD-10-CM | POA: Diagnosis not present

## 2018-04-19 NOTE — Patient Instructions (Signed)
Access Code: [email or web address]  URL: [email or web address]  Date: 04/19/2018  Prepared by: Sheria Lang   Exercises  Supine Lower Trunk Rotation - 6 reps - 10 hold - 1x daily - 7x weekly  Supine Posterior Pelvic Tilt - 20 reps - 1x daily - 7x weekly

## 2018-04-20 NOTE — Therapy (Signed)
[facility] MAIN [facility] SERVICES [address] [place], [place], [address] Phone: [phone]   Fax:  [phone]  Physical Therapy Evaluation  Patient Details  Name: Sharon Bullock MRN: [id number] Date of Birth: June 01, 1933 Referring Provider (PT): Faythe Ghee, [place]   Encounter Date: 04/19/2018  PT End of Session - 04/19/18 1547    Visit Number  1    Number of Visits  17    Date for PT Re-Evaluation  06/14/18    Authorization Type  1/10 (eval 04/19/2018)    PT Start Time  1000    PT Stop Time  1102    PT Time Calculation (min)  62 min    Equipment Utilized During Treatment  Gait belt    Activity Tolerance  Patient limited by pain    Behavior During Therapy  [facility] for tasks assessed/performed       Past Medical History:  Diagnosis Date  . Allergy   . Enlarged heart 2000  . GERD (gastroesophageal reflux disease)   . Hypertension   . Insomnia   . Vertigo 2006    Past Surgical History:  Procedure Laterality Date  . BLADDER REPAIR  2014   tact    There were no vitals filed for this visit.   Subjective Assessment - 04/19/18 1009    Subjective  Patient states she has never experienced anything like this before. Patient has midline back pain which radiates down both sides, L >R. Patient is stiff and numb in the morning; patient is very sore and stiff even after moving around. Patient has numbness in the forefoot on the dorsal surface.     Pertinent History  Per MD note: Symptoms started approximately 3 months ago without any inciting event prior to onset. She does recall, however, that she did a lot of cleaning and moving heavy boxes prior to onset but denies any specific injury during this to cause her pain.    Limitations  Standing;Walking;Lifting;House hold activities;Sitting    How long can you stand comfortably?  >10 min.    How long can you walk comfortably?  >5 min.    Patient Stated Goals  walk better, stairs without any  problem    Currently in Pain?  Yes    Pain Score  10-Worst pain ever    Pain Location  Back    Pain Orientation  Lower;Medial;Right;Left    Pain Descriptors / Indicators  Aching;Burning    Pain Type  Chronic pain    Pain Radiating Towards  feet    Pain Onset  More than a month ago    Pain Frequency  Constant    Aggravating Factors   bending, stairs, sit<>stand    Pain Relieving Factors  prop feet up, laying on the back (not ideal because of vertigo)    Effect of Pain on Daily Activities  limited in all areas because of pain    Multiple Pain Sites  Yes    Pain Score  10    Pain Location  Knee    Pain Orientation  Left;Right    Pain Descriptors / Indicators  Aching;Burning    Pain Type  Chronic pain    Pain Onset  More than a month ago    Pain Frequency  Constant         [facility] PT Assessment - 04/19/18 0951      Assessment   Medical Diagnosis  lumbar spondylosis    Referring Provider (PT)  Danilkowicz, Elease Hashimoto, PA-C  Onset Date/Surgical Date  12/18/17    Hand Dominance  Right    Prior Therapy  Yes, diff dx      Precautions   Precautions  Fall      Restrictions   Weight Bearing Restrictions  No      Balance Screen   Has the patient fallen in the past 6 months  No      Home Environment   Living Environment  Private residence    Living Arrangements  Children    Type of Home  House    Home Access  Ramped entrance    Home Layout  One level    Home Equipment  Eagle - single point      Prior Function   Level of Independence  Independent      Cognition   Overall Cognitive Status  Within Functional Limits for tasks assessed      AROM   Lumbar Flexion  33    Lumbar - Right Side Bend  24    Lumbar - Left Side Bend  14      Strength   Right Hip Flexion  3+/5    Right Hip Extension  3-/5    Right Hip External Rotation   3+/5    Right Hip Internal Rotation  4-/5    Right Hip ABduction  3/5    Right Hip ADduction  3/5    Left Hip Flexion  4-/5    Left Hip  Extension  3-/5    Left Hip External Rotation  3/5    Left Hip Internal Rotation  3+/5    Left Hip ABduction  3/5    Left Hip ADduction  3/5    Right Knee Flexion  4/5    Right Knee Extension  5/5    Left Knee Flexion  3+/5    Left Knee Extension  4+/5    Right Ankle Dorsiflexion  4/5    Right Ankle Plantar Flexion  4/5    Left Ankle Dorsiflexion  4/5    Left Ankle Plantar Flexion  4/5      5TSTS: BUE support, 30.6 seconds Gait: genu valgum, shuffling gait, SPC, decreased stride length bilaterally  Objective measurements completed on examination: See above findings.     PT Education - 04/19/18 1546    Education Details  POC, prognosis, examination findings, HEP    Person(s) Educated  Patient    Methods  Explanation;Demonstration    Comprehension  Verbalized understanding;Need further instruction       PT Short Term Goals - 04/20/18 0847      PT SHORT TERM GOAL #1   Title  Patient will be independent with HEP in order to maintain/improve gains made in physical therapy.    Baseline  Provided    Time  2    Period  Weeks    Status  New    Target Date  05/04/18      PT SHORT TERM GOAL #2   Title  Patient will have a NPRS rating of 6/10 or less to represent a clinically significant decrease in pain for improved function at home and in the community.    Baseline  10/10    Time  4    Period  Weeks    Status  New    Target Date  05/18/18        PT Long Term Goals - 04/20/18 0849      PT LONG TERM GOAL #1   Title  Pt will decrease 5TSTS by at least 3 seconds in order to demonstrate clinically significant improvement in LE strength.    Baseline  30.6 sec with BUE support    Time  8    Period  Weeks    Status  New    Target Date  06/15/18      PT LONG TERM GOAL #2   Title  Pt will decrease mODI scoreby at least 13 points in order demonstrate clinically significant reduction in back pain/disability.     Baseline  60%    Time  8    Period  Weeks    Status  New     Target Date  06/15/18      PT LONG TERM GOAL #3   Title  Patient will be able to walk with LRAD for > 5 minutes, symmetrical stride length, and improved foot clearance during swings without increased genu valgum in order to return to her PLOF and decrease risk of falls.    Baseline  Household distances <5 min, SPC, shuffling gait with increased genu valgum    Time  8    Period  Weeks    Status  New    Target Date  06/15/18             Plan - 04/19/18 1547    Clinical Impression Statement  Patient is a pleasant 85 year old female presenting to clinic with complaints of midline low back pain that radiates down BLE (L>R) with reports of numbness on the dorsal aspect of B feet. PT examination revealed deficits in ROM, BLE strength, mobility, pain, and gait. Patient's lumbar flexion is painful at end range in weight bearing, but is pain relieving in supine. Patient's gross strength in BLE is 3+/5. PT examination did not reproduce radilcular symptoms with Slump test or SLR on either limb, however patient had concordant pain with a FABER on the L. Patient has increased genu valgum with limited foot clearance and shortened strides bilaterally during gait with a SPC on the R. Patient's progress may be impacted by the recent loss of her husband, the intensity of her pain, and her increased age; however, patient has social support at home, is independent and highly motivated. Patient will benefit from skilled therapeutic intervention to address deficits in strength, ROM, mobility, activity tolerance, and pain in order to decrease risk of falls and improve overall QOL.    History and Personal Factors relevant to plan of care:  PMH: HTN, anemia, Dyslipidemia, osteopenia, A-fib, enlarged heart, pelvic pain. Social: lost husband of 57 years 12/2017. (+) living with son, motivated    Clinical Presentation  Evolving    Clinical Presentation due to:  Multiple comorbidities: osteopenia, several cardiac ddx,  vertigo, anxiety, depression; chronicity of pain    Clinical Decision Making  Moderate    Rehab Potential  Good    PT Frequency  2x / week    PT Duration  8 weeks    PT Treatment/Interventions  Aquatic Therapy;Cryotherapy;Electrical Stimulation;Moist Heat;Stair training;Neuromuscular re-education;Gait training;Balance training;Therapeutic exercise;Therapeutic activities;Patient/family education;Manual techniques;Taping;Dry needling;Passive range of motion;Vestibular    PT Next Visit Plan  10mWT, strengthening    PT Home Exercise Plan  pelvic tilts, HLTR (see patient instructions)    Consulted and Agree with Plan of Care  Patient       Patient will benefit from skilled therapeutic intervention in order to improve the following deficits and impairments:  Abnormal gait, Improper body mechanics, Pain, Decreased mobility, Postural dysfunction, Decreased  strength, Decreased endurance, Decreased activity tolerance, Decreased balance, Difficulty walking, Impaired sensation  Visit Diagnosis: Chronic midline low back pain, unspecified whether sciatica present  Other abnormalities of gait and mobility  Muscle weakness (generalized)  Difficulty in walking, not elsewhere classified     Problem List Patient Active Problem List   Diagnosis Date Noted  . Thrombocytopenia (HCC) 05/27/2016  . CHF (congestive heart failure) (HCC) 05/26/2016  . Mesenteric vascular insufficiency (HCC) 02/28/2016  . Pancreas cyst 02/28/2016  . Increased endometrial stripe thickness 02/28/2016  . Carotid atherosclerosis, bilateral 01/23/2016  . Hearing loss 08/17/2015  . Mitral regurgitation 04/19/2015  . Pernicious anemia 09/27/2014  . Paroxysmal A-fib (HCC) 09/27/2014  . Benign essential HTN 09/27/2014  . Perennial allergic rhinitis 09/27/2014  . Chronic anemia 09/27/2014  . Insomnia, persistent 09/27/2014  . Hypertensive pulmonary vascular disease (HCC) 09/27/2014  . B12 deficiency 09/27/2014  .  Diverticulosis of colon 09/27/2014  . Dyslipidemia 09/27/2014  . Edema extremities 09/27/2014  . Gastric reflux 09/27/2014  . Bilateral hearing loss 09/27/2014  . Polypharmacy 09/27/2014  . Pelvic pain in female 09/27/2014  . MI (mitral incompetence) 09/27/2014  . Internal hemorrhoids 09/27/2014  . Osteopenia 09/27/2014  . Arthralgia of shoulder 09/27/2014  . Finger tendinitis 09/27/2014  . Urge incontinence 09/27/2014  . Cervical prolapse 09/27/2014  . Vertigo 09/27/2014  . Vitamin D deficiency 09/27/2014  . Bladder cystocele 04/15/2012  . Asymmetric septal hypertrophy (HCC) 05/19/2011  . Hypertrophic cardiomyopathy (HCC) 05/19/2011   Sheria Lang PT, DPT [phone] 04/20/2018, 8:54 AM  [place] [facility] MAIN [facility] SERVICES [address] [place], [place], [address] Phone: [phone]   Fax:  [phone]  Name: Sharon Bullock MRN: [id number] Date of Birth: [birth year]

## 2018-04-23 ENCOUNTER — Encounter: Payer: Self-pay | Admitting: Family Medicine

## 2018-04-23 ENCOUNTER — Ambulatory Visit (INDEPENDENT_AMBULATORY_CARE_PROVIDER_SITE_OTHER): Payer: Medicare Other | Admitting: Family Medicine

## 2018-04-23 VITALS — BP 118/64 | HR 61 | Temp 97.9°F | Resp 16 | Ht 65.0 in | Wt 161.3 lb

## 2018-04-23 DIAGNOSIS — G4700 Insomnia, unspecified: Secondary | ICD-10-CM | POA: Diagnosis not present

## 2018-04-23 DIAGNOSIS — D692 Other nonthrombocytopenic purpura: Secondary | ICD-10-CM

## 2018-04-23 DIAGNOSIS — E785 Hyperlipidemia, unspecified: Secondary | ICD-10-CM | POA: Diagnosis not present

## 2018-04-23 DIAGNOSIS — I480 Paroxysmal atrial fibrillation: Secondary | ICD-10-CM

## 2018-04-23 DIAGNOSIS — I10 Essential (primary) hypertension: Secondary | ICD-10-CM | POA: Diagnosis not present

## 2018-04-23 DIAGNOSIS — E538 Deficiency of other specified B group vitamins: Secondary | ICD-10-CM | POA: Diagnosis not present

## 2018-04-23 DIAGNOSIS — K551 Chronic vascular disorders of intestine: Secondary | ICD-10-CM

## 2018-04-23 LAB — LIPID PANEL
CHOLESTEROL: 168 mg/dL (ref <200)
HDL: 74 mg/dL (ref >50)
LDL Cholesterol (Calc): 78 mg/dL (calc)
Non-HDL Cholesterol (Calc): 94 mg/dL (calc) (ref <130)
Total CHOL/HDL Ratio: 2.3 (calc) (ref <5.0)
Triglycerides: 75 mg/dL (ref <150)

## 2018-04-23 MED ORDER — ZOLPIDEM TARTRATE 5 MG PO TABS: 5.0000 mg | ORAL_TABLET | Freq: Every evening | ORAL | 0 refills | Status: DC | PRN

## 2018-04-23 NOTE — Progress Notes (Signed)
Name: Sharon Bullock   MRN: [id number]    DOB: [birth year]   Date:04/23/2018       Progress Note  Subjective  Chief Complaint  Chief Complaint  Patient presents with  . Medication Refill    3 month F/U  . Hypertension    Denies any symptoms  . B12 Deficiency  . Grieving  . Left Lower Back Pain    Has been bothering her more- has been doing PT   . Carotid atherosclerosis    HPI  HTN: bp isslightly low today, no orthostatic changes, chest pain, SOB or palpitation. She states edema of lower legs got a little worse since she went to PT, not wearing her compression stocking hoses   Carotid atherosclerosis: found during MRI head done at [facility] at [facility] for evaluation of vertigo in 12/2015. On medical management at this time. She also has mesenteric insufficiency, she denies abdominal pain or blood in stools.Taking Crestor and Pradaxa daily . No side effects of medication   B12 deficiency with a previous history of pernicious anemia: she is receiving monthly B12 shots and has been doing well. We will give her another B12 injection today   Afib: she sees a cardiologist - Dr. Regino Schultze at [facility]. She has been on Pradaxa, she has occasional fluttering ( she states only when has significant allergy symptoms)  she has lower extremity  edema, she deniesdecrease in exercise tolerance. Unchanged   Insomnia: She tried Trazodone without help, only able to sleep one hour per night,she is on Ambien 10 mg, tried Ambien 5 mg without much help,but insurance stopped paying for it, so she is back on 5 mg.   Hypertrophic cardiomyopathy and hypertensive pulmonary vascular disease: she sees Dr. Regino Schultze, no chest pain, SOB orparoxysmal nocturnal dyspnea, the bilateral swelling is a little worse today. She is on ARB, beta-blockerand Pradaxa for afib. Unchanged   Dyslipidemia: she is on Crestor and CoQ 10, reviewed labs and they are at goal, denies side effects of medication . Time to recheck labs today    Thrombocytopenia: seeing hematologist- Dr. Teena Irani the past, we will recheck CBC today, MM evaluation was negative . Last CBC was back to normal , anemia stable   Grieving: lost her husband April 2019, doing okay  Left lower back pain: she got worse, seen by Maurice Small, seeing PT now, using a cane, she states PT makes her sore. She still has 8 weeks to go and if not better she will have steroids and possible surgery . Seeing PA - Ortho at [facility]    Patient Active Problem List   Diagnosis Date Noted  . Thrombocytopenia (HCC) 05/27/2016  . CHF (congestive heart failure) (HCC) 05/26/2016  . Mesenteric vascular insufficiency (HCC) 02/28/2016  . Pancreas cyst 02/28/2016  . Increased endometrial stripe thickness 02/28/2016  . Carotid atherosclerosis, bilateral 01/23/2016  . Hearing loss 08/17/2015  . Mitral regurgitation 04/19/2015  . Pernicious anemia 09/27/2014  . Paroxysmal A-fib (HCC) 09/27/2014  . Benign essential HTN 09/27/2014  . Perennial allergic rhinitis 09/27/2014  . Chronic anemia 09/27/2014  . Insomnia, persistent 09/27/2014  . Hypertensive pulmonary vascular disease (HCC) 09/27/2014  . B12 deficiency 09/27/2014  . Diverticulosis of colon 09/27/2014  . Dyslipidemia 09/27/2014  . Edema extremities 09/27/2014  . Gastric reflux 09/27/2014  . Bilateral hearing loss 09/27/2014  . Polypharmacy 09/27/2014  . Pelvic pain in female 09/27/2014  . MI (mitral incompetence) 09/27/2014  . Internal hemorrhoids 09/27/2014  . Osteopenia 09/27/2014  . Arthralgia of shoulder  09/27/2014  . Finger tendinitis 09/27/2014  . Urge incontinence 09/27/2014  . Cervical prolapse 09/27/2014  . Vertigo 09/27/2014  . Vitamin D deficiency 09/27/2014  . Bladder cystocele 04/15/2012  . Asymmetric septal hypertrophy (HCC) 05/19/2011  . Hypertrophic cardiomyopathy (HCC) 05/19/2011    Past Surgical History:  Procedure Laterality Date  . BLADDER REPAIR  2014   tact    Family History   Problem Relation Age of Onset  . Alzheimer's disease Mother   . Hypertension Mother   . Stroke Father   . Heart disease Father   . Heart disease Brother   . Hypertension Brother   . Cancer Daughter        breast  . Hypertension Daughter   . Breast cancer Daughter   . Diabetes Brother   . Heart disease Brother   . Heart disease Brother   . Diabetes Brother   . Hypertension Brother   . Heart disease Brother   . Hypertension Brother   . Cancer Brother        prostate  . Diabetes Brother   . Heart disease Brother   . Hypertension Brother   . Cancer Brother        prostate    Social History   Socioeconomic History  . Marital status: Widowed    Spouse name: Not on file  . Number of children: 5  . Years of education: some college  . Highest education level: 12th grade  Occupational History  . Occupation: Retired  Engineer, productionocial Needs  . Financial resource strain: Not hard at all  . Food insecurity:    Worry: Never true    Inability: Never true  . Transportation needs:    Medical: No    Non-medical: No  Tobacco Use  . Smoking status: Never Smoker  . Smokeless tobacco: Never Used  . Tobacco comment: smoking cessation materials not required  Substance and Sexual Activity  . Alcohol use: No    Alcohol/week: 0.0 standard drinks  . Drug use: No  . Sexual activity: Not Currently  Lifestyle  . Physical activity:    Days per week: 0 days    Minutes per session: 0 min  . Stress: To some extent  Relationships  . Social connections:    Talks on phone: Patient refused    Gets together: Patient refused    Attends religious service: Patient refused    Active member of club or organization: Patient refused    Attends meetings of clubs or organizations: Patient refused    Relationship status: Widowed  . Intimate partner violence:    Fear of current or ex partner: No    Emotionally abused: No    Physically abused: No    Forced sexual activity: No  Other Topics Concern  . Not  on file  Social History Narrative   Lost husband 07/18/2017   Grown son still living with her      Current Outpatient Medications:  .  amiodarone (PACERONE) 200 MG tablet, Take 1 tablet by mouth daily., Disp: , Rfl:  .  amLODipine (NORVASC) 10 MG tablet, Take 1 tablet (10 mg total) by mouth daily., Disp: 90 tablet, Rfl: 1 .  azelastine (ASTELIN) 0.1 % nasal spray, Place 2 sprays into both nostrils 2 (two) times daily. Use in each nostril as directed, Disp: 30 mL, Rfl: 12 .  calcium-vitamin D (OSCAL WITH D) 500-200 MG-UNIT tablet, Take 1 tablet by mouth 2 (two) times daily., Disp: , Rfl:  .  carvedilol (  COREG) 25 MG tablet, Take 1 tablet (25 mg total) by mouth 2 (two) times daily with a meal., Disp: 180 tablet, Rfl: 1 .  COENZYME Q10 PO, Take 30 mg by mouth daily., Disp: , Rfl:  .  diclofenac sodium (VOLTAREN) 1 % GEL, APPLY TO WRIST TWICE DAILY, Disp: 100 g, Rfl: 0 .  ferrous gluconate (FERGON) 324 MG tablet, Take 324 mg by mouth daily with breakfast., Disp: , Rfl:  .  fluticasone (FLONASE) 50 MCG/ACT nasal spray, Place 2 sprays into both nostrils daily., Disp: 16 g, Rfl: 6 .  folic acid (FOLVITE) 400 MCG tablet, Take 1 tablet by mouth daily. Reported on 04/19/2015, Disp: , Rfl:  .  irbesartan (AVAPRO) 300 MG tablet, TAKE 1 TABLET BY MOUTH EVERY DAY FOR BLOOD PRESSURE, Disp: 90 tablet, Rfl: 1 .  omeprazole (PRILOSEC) 40 MG capsule, Take 1 capsule (40 mg total) by mouth daily., Disp: 90 capsule, Rfl: 1 .  PRADAXA 150 MG CAPS capsule, Take 1 capsule (150 mg total) by mouth 2 (two) times daily., Disp: 180 capsule, Rfl: 1 .  rosuvastatin (CRESTOR) 10 MG tablet, TAKE 1 TABLET BY MOUTH DAILY WITH CO-Q10, Disp: 90 tablet, Rfl: 1 .  Vitamin D, Cholecalciferol, 1000 UNITS TABS, Take 1 tablet by mouth daily., Disp: , Rfl:  .  zolpidem (AMBIEN) 5 MG tablet, Take 1 tablet (5 mg total) by mouth at bedtime as needed for sleep., Disp: 90 tablet, Rfl: 0  Current Facility-Administered Medications:  .   cyanocobalamin ((VITAMIN B-12)) injection 1,000 mcg, 1,000 mcg, Intramuscular, Q30 days, Alba Cory, MD, 1,000 mcg at 04/23/18 1020  Allergies  Allergen Reactions  . Nickel Dermatitis and Swelling  . Other Anaphylaxis    Uncoded Allergy. Allergen: SHELLFISH  . Ace Inhibitors Cough    I personally reviewed active problem list, medication list, allergies, family history, social history with the patient/caregiver today.   ROS  Constitutional: Negative for fever , positive for mild weight change.  Respiratory: Negative for cough and shortness of breath.   Cardiovascular: Negative for chest pain or palpitations.  Gastrointestinal: Negative for abdominal pain, no bowel changes.  Musculoskeletal: Positive  for gait problem but no  joint swelling.  Skin: Negative for rash.  Neurological: Negative for dizziness or headache.  No other specific complaints in a complete review of systems (except as listed in HPI above).  Objective  Vitals:   04/23/18 1010  BP: 118/64  Pulse: 61  Resp: 16  Temp: 97.9 F (36.6 C)  TempSrc: Oral  SpO2: 97%  Weight: 161 lb 4.8 oz (73.2 kg)  Height: 5\' 5"  (1.651 m)    Body mass index is 26.84 kg/m.  Physical Exam  Constitutional: Patient appears well-developed and well-nourished. Overweight.  No distress.  HEENT: head atraumatic, normocephalic, pupils equal and reactive to light, neck supple, throat within normal limits Cardiovascular: Normal rate, regular rhythm and normal heart sounds.  2/6 systolic  murmur heard. trace  BLE edema. Pulmonary/Chest: Effort normal and breath sounds normal. No respiratory distress. Abdominal: Soft.  There is no tenderness. Muscular skeletal: pain during palpation of left  lumbar spine, using a cane, negative straight leg raise Psychiatric: Patient has a normal mood and affect. behavior is normal. Judgment and thought content normal.  PHQ2/9: Depression screen [place] 2/9 04/23/2018 03/04/2018 01/12/2018 01/12/2018  10/07/2017  Decreased Interest 0 0 2 2 -  Down, Depressed, Hopeless 0 0 2 1 -  PHQ - 2 Score 0 0 4 3 -  Altered sleeping 3  0 2 2 -  Tired, decreased energy 1 0 2 2 -  Change in appetite 0 0 3 1 -  Feeling bad or failure about yourself  1 0 0 0 -  Trouble concentrating 0 0 0 0 -  Moving slowly or fidgety/restless 3 0 1 2 -  Suicidal thoughts 0 0 0 0 -  PHQ-9 Score 8 0 12 10 -  Difficult doing work/chores Very difficult Not difficult at all Not difficult at all Somewhat difficult (No Data)     Fall Risk: Fall Risk  04/23/2018 03/04/2018 01/12/2018 01/12/2018 10/07/2017  Falls in the past year? 0 0 No No No  Number falls in past yr: 0 - - - -  Injury with Fall? 0 0 - - -  Risk for fall due to : - - Impaired vision;Impaired balance/gait;Medication side effect;History of fall(s);Mental status change - -  Risk for fall due to: Comment - - ambualtes with cane, new pain to L extremity resulting in referral to ortho; wears eyeglasses; recent depression resulting in start of Zoloft - -  Follow up - - - - -     Functional Status Survey: Is the patient deaf or have difficulty hearing?: Yes Does the patient have difficulty seeing, even when wearing glasses/contacts?: Yes Does the patient have difficulty concentrating, remembering, or making decisions?: No Does the patient have difficulty walking or climbing stairs?: Yes Does the patient have difficulty dressing or bathing?: No Does the patient have difficulty doing errands alone such as visiting a doctor's office or shopping?: No    Assessment & Plan  1. Paroxysmal A-fib (HCC)  Keep follow up with Dr. Regino Schultze  2. Benign essential HTN  Keep monitoring, she states bp goes up and down, usually in the 140's  3. Dyslipidemia  - Lipid panel  4. Mesenteric vascular insufficiency (HCC)  Continue medication, no symptoms   5. B12 deficiency  B12 today   6. Insomnia, persistent  - zolpidem (AMBIEN) 5 MG tablet; Take 1 tablet (5 mg total)  by mouth at bedtime as needed for sleep.  Dispense: 90 tablet; Refill: 0  7. Senile purpura (HCC)  stable

## 2018-04-27 ENCOUNTER — Ambulatory Visit: Payer: Medicare Other

## 2018-04-27 DIAGNOSIS — M545 Low back pain, unspecified: Secondary | ICD-10-CM

## 2018-04-27 DIAGNOSIS — R2689 Other abnormalities of gait and mobility: Secondary | ICD-10-CM

## 2018-04-27 DIAGNOSIS — G8929 Other chronic pain: Secondary | ICD-10-CM

## 2018-04-27 DIAGNOSIS — M6281 Muscle weakness (generalized): Secondary | ICD-10-CM

## 2018-04-27 DIAGNOSIS — R262 Difficulty in walking, not elsewhere classified: Secondary | ICD-10-CM | POA: Diagnosis not present

## 2018-04-27 NOTE — Therapy (Signed)
[facility] MAIN [facility] SERVICES [address] [place], [place], [address] Phone: [phone]   Fax:  [phone]  Physical Therapy Treatment  Patient Details  Name: Sharon Bullock MRN: [id number] Date of Birth: 18-Oct-1933 Referring Provider (PT): Faythe Ghee, [place]   Encounter Date: 04/27/2018  PT End of Session - 04/27/18 1633    Visit Number  2    Number of Visits  17    Date for PT Re-Evaluation  06/14/18    Authorization Type  2/10 (eval 04/19/2018)    PT Start Time  0931    PT Stop Time  1015    PT Time Calculation (min)  44 min    Equipment Utilized During Treatment  Gait belt    Activity Tolerance  Patient limited by pain    Behavior During Therapy  [facility] for tasks assessed/performed       Past Medical History:  Diagnosis Date  . Allergy   . Enlarged heart 2000  . GERD (gastroesophageal reflux disease)   . Hypertension   . Insomnia   . Vertigo 2006    Past Surgical History:  Procedure Laterality Date  . BLADDER REPAIR  2014   tact    There were no vitals filed for this visit.   Subjective Assessment - 04/27/18 1631    Subjective  Patient reports her back feels a little better since her evaluation. Patient reports she had an x ray  last week which shows nerve pinch in R hip.     Pertinent History  Per MD note: Symptoms started approximately 3 months ago without any inciting event prior to onset. She does recall, however, that she did a lot of cleaning and moving heavy boxes prior to onset but denies any specific injury during this to cause her pain.    Limitations  Standing;Walking;Lifting;House hold activities;Sitting    How long can you stand comfortably?  >10 min.    How long can you walk comfortably?  >5 min.    Patient Stated Goals  walk better, stairs without any problem    Currently in Pain?  Yes    Pain Score  7     Pain Location  Back    Pain Orientation  Lower    Pain Descriptors / Indicators   Aching;Contraction;Discomfort    Pain Type  Chronic pain    Pain Radiating Towards  left thigh    Pain Onset  More than a month ago    Pain Frequency  Constant    Aggravating Factors   bending, stairs sit to stand    Pain Relieving Factors  laying down       Review HEP: Posterior pelvic tilts 20x  Supine lower trunk rotations 6 reps 10 second holds  Supine:  Hamstring stretch BLE 2x 60 seconds each LE, patient leg on PT shoulder guiding into progressive lengthening holds   Nerve glides, SLR with leg on PT shoulder, ankle df and pf 20x each LE  LE rotation for reduction of back pain 2 minutes  Piriformis stretch/figure four stretch: relieving of pain on R, painful on L requiring re-adjustment to reduce pain and improve stretch 60 second holds  Short arc distraction with belt: knee/hip flexed inferior distraction: 5x20 second holds ; One leg at a time, BLE  Long arc distraction with belt: inferior distraction 6x20 second holds, one leg at a time, BLE   Prone:  Hip flexor stretch 2x 30 seconds  Roller to paraspinal musculature and piriformis  for muscle tissue lengthening x 5 minutes  Gentle grade I-II CPAs and UPAs to lumbar region for pain reduction x20 seconds each level 3x each level.   Patient requires Mod A for bed mobility, unable to move LE's independently to transition prone<>supine   Pain reduced to 5/10 by end of session:                         PT Education - 04/27/18 1633    Education Details  HEP compliance, manual,     Person(s) Educated  Patient    Methods  Explanation;Demonstration;Tactile cues;Verbal cues    Comprehension  Verbalized understanding;Returned demonstration;Tactile cues required;Need further instruction;Verbal cues required       PT Short Term Goals - 04/20/18 0847      PT SHORT TERM GOAL #1   Title  Patient will be independent with HEP in order to maintain/improve gains made in physical therapy.    Baseline   Provided    Time  2    Period  Weeks    Status  New    Target Date  05/04/18      PT SHORT TERM GOAL #2   Title  Patient will have a NPRS rating of 6/10 or less to represent a clinically significant decrease in pain for improved function at home and in the community.    Baseline  10/10    Time  4    Period  Weeks    Status  New    Target Date  05/18/18        PT Long Term Goals - 04/20/18 0849      PT LONG TERM GOAL #1   Title  Pt will decrease 5TSTS by at least 3 seconds in order to demonstrate clinically significant improvement in LE strength.    Baseline  30.6 sec with BUE support    Time  8    Period  Weeks    Status  New    Target Date  06/15/18      PT LONG TERM GOAL #2   Title  Pt will decrease mODI scoreby at least 13 points in order demonstrate clinically significant reduction in back pain/disability.     Baseline  60%    Time  8    Period  Weeks    Status  New    Target Date  06/15/18      PT LONG TERM GOAL #3   Title  Patient will be able to walk with LRAD for > 5 minutes, symmetrical stride length, and improved foot clearance during swings without increased genu valgum in order to return to her PLOF and decrease risk of falls.    Baseline  Household distances <5 min, SPC, shuffling gait with increased genu valgum    Time  8    Period  Weeks    Status  New    Target Date  06/15/18            Plan - 04/27/18 1636    Clinical Impression Statement  Patient presents with limited muscle tissue length of paraspinals and LE's that improved throughout the session. Distraction reduced pain and tingling in LE's however LLE tolerated less than right at this time. Patient given another copy of her HEP for compliance and educated on why therapy will help her back and why she needs to perform her HEP. Patient will benefit from skilled therapeutic intervention to address deficits in strength, ROM, mobility,  activity tolerance, and pain in order to decrease risk of falls  and improve overall QOL.    Rehab Potential  Good    PT Frequency  2x / week    PT Duration  8 weeks    PT Treatment/Interventions  Aquatic Therapy;Cryotherapy;Electrical Stimulation;Moist Heat;Stair training;Neuromuscular re-education;Gait training;Balance training;Therapeutic exercise;Therapeutic activities;Patient/family education;Manual techniques;Taping;Dry needling;Passive range of motion;Vestibular    PT Next Visit Plan  , strengthening    PT Home Exercise Plan  pelvic tilts, HLTR (see patient instructions)    Consulted and Agree with Plan of Care  Patient       Patient will benefit from skilled therapeutic intervention in order to improve the following deficits and impairments:  Abnormal gait, Improper body mechanics, Pain, Decreased mobility, Postural dysfunction, Decreased strength, Decreased endurance, Decreased activity tolerance, Decreased balance, Difficulty walking, Impaired sensation  Visit Diagnosis: Chronic midline low back pain, unspecified whether sciatica present  Other abnormalities of gait and mobility  Muscle weakness (generalized)     Problem List Patient Active Problem List   Diagnosis Date Noted  . Thrombocytopenia (HCC) 05/27/2016  . CHF (congestive heart failure) (HCC) 05/26/2016  . Mesenteric vascular insufficiency (HCC) 02/28/2016  . Pancreas cyst 02/28/2016  . Increased endometrial stripe thickness 02/28/2016  . Carotid atherosclerosis, bilateral 01/23/2016  . Hearing loss 08/17/2015  . Mitral regurgitation 04/19/2015  . Pernicious anemia 09/27/2014  . Paroxysmal A-fib (HCC) 09/27/2014  . Benign essential HTN 09/27/2014  . Perennial allergic rhinitis 09/27/2014  . Chronic anemia 09/27/2014  . Insomnia, persistent 09/27/2014  . Hypertensive pulmonary vascular disease (HCC) 09/27/2014  . B12 deficiency 09/27/2014  . Diverticulosis of colon 09/27/2014  . Dyslipidemia 09/27/2014  . Edema extremities 09/27/2014  . Gastric reflux 09/27/2014   . Bilateral hearing loss 09/27/2014  . Polypharmacy 09/27/2014  . Pelvic pain in female 09/27/2014  . MI (mitral incompetence) 09/27/2014  . Internal hemorrhoids 09/27/2014  . Osteopenia 09/27/2014  . Arthralgia of shoulder 09/27/2014  . Finger tendinitis 09/27/2014  . Urge incontinence 09/27/2014  . Cervical prolapse 09/27/2014  . Vertigo 09/27/2014  . Vitamin D deficiency 09/27/2014  . Bladder cystocele 04/15/2012  . Asymmetric septal hypertrophy (HCC) 05/19/2011  . Hypertrophic cardiomyopathy (HCC) 05/19/2011   Precious Bard, PT, DPT   04/27/2018, 4:37 PM  [place] [facility] MAIN [facility] SERVICES [address] [place], [place], [address] Phone: [phone]   Fax:  [phone]  Name: Veryle Dethloff MRN: [id number] Date of Birth: Jul 31, 1933

## 2018-05-04 ENCOUNTER — Ambulatory Visit: Payer: Medicare Other

## 2018-05-04 DIAGNOSIS — R2689 Other abnormalities of gait and mobility: Secondary | ICD-10-CM | POA: Diagnosis not present

## 2018-05-04 DIAGNOSIS — G8929 Other chronic pain: Secondary | ICD-10-CM | POA: Diagnosis not present

## 2018-05-04 DIAGNOSIS — M6281 Muscle weakness (generalized): Secondary | ICD-10-CM | POA: Diagnosis not present

## 2018-05-04 DIAGNOSIS — R262 Difficulty in walking, not elsewhere classified: Secondary | ICD-10-CM

## 2018-05-04 DIAGNOSIS — M545 Low back pain, unspecified: Secondary | ICD-10-CM

## 2018-05-04 NOTE — Therapy (Signed)
[place] [facility] MAIN [facility] [address] [place], [place], [address] Phone: [phone]   Fax:  [phone]  Physical Therapy Treatment  Patient Details  Name: Sharon FlockZeola Bullock MRN: [id number] Date of Birth: [birth year] Referring Provider (PT): Faythe Gheeanilkowicz, Patricia, [place]   Encounter Date: 05/04/2018  PT End of Session - 05/04/18 0925    Visit Number  3    Number of Visits  17    Date for PT Re-Evaluation  06/14/18    Authorization Type  3/10 (eval 04/19/2018)    PT Start Time  0930    PT Stop Time  1014    PT Time Calculation (min)  44 min    Equipment Utilized During Treatment  Gait belt    Activity Tolerance  Patient limited by pain    Behavior During Therapy  [facility] for tasks assessed/performed       Past Medical History:  Diagnosis Date  . Allergy   . Enlarged heart 2000  . GERD (gastroesophageal reflux disease)   . Hypertension   . Insomnia   . Vertigo 2006    Past Surgical History:  Procedure Laterality Date  . BLADDER REPAIR  2014   tact    There were no vitals filed for this visit.  Subjective Assessment - 05/04/18 0936    Subjective  Patient reports that her vertigo came on when doing her exercises since last session. Reports her pain is mostly in her legs and hips not as much in her back.     Pertinent History  Per MD note: Symptoms started approximately 3 months ago without any inciting event prior to onset. She does recall, however, that she did a lot of cleaning and moving heavy boxes prior to onset but denies any specific injury during this to cause her pain.    Limitations  Standing;Walking;Lifting;House hold activities;Sitting    How long can you stand comfortably?  >10 min.    How long can you walk comfortably?  >5 min.    Patient Stated Goals  walk better, stairs without any problem    Currently in Pain?  Yes    Pain Score  6     Pain Location  Leg    Pain Orientation  Right;Left    Pain Descriptors /  Indicators  Aching    Pain Type  Chronic pain    Pain Onset  More than a month ago    Pain Frequency  Constant      Supine:  Hamstring stretch BLE 2x 60 seconds each LE, patient leg on PT shoulder guiding into progressive lengthening holds    Nerve glides, SLR with leg on PT shoulder, ankle df and pf 20x each LE   LE rotation for reduction of back pain 2 minutes   Short arc distraction with belt: knee/hip flexed inferior distraction: 5x20 second holds ; One leg at a time, BLE   Long arc distraction with belt: inferior distraction 6x20 second holds, one leg at a time, BLE   TrA activation 10x 3 second holds, tactile cueing for activation with verbal encouragement  TrA activation with swiss ball squeeze between knees and hands for isometric control 10x 3 second holds.   Adduction ball squeezes 10x 3 second holds in hooklying position  Abduction RTB 10x 3 second holds in hooklying position,  Verbal cues for body mechanics and tactile assistance for range of motion.     Sidelying.:  Hip flexor stretch 2x 30 seconds   STM paraspinal  musculature and piriformis for muscle tissue lengthening x 5 minutes   Gentle grade I-II CPAs and UPAs to lumbar region for pain reduction x20 seconds each level 3x each level.    Seated: PT guiding into gentle trunk flexion via UE's then back into upright position 10x for back muscle tissue lengthening.   Patient requires Mod A for bed mobility, unable to move LE's independently to transition prone<>supine                         PT Education - 05/04/18 0925    Education Details  HEP compliance, manual, exercise technique     Person(s) Educated  Patient    Methods  Explanation;Demonstration;Tactile cues;Verbal cues    Comprehension  Verbalized understanding;Returned demonstration;Verbal cues required;Tactile cues required;Need further instruction       PT Short Term Goals - 04/20/18 0847      PT SHORT TERM GOAL #1   Title   Patient will be independent with HEP in order to maintain/improve gains made in physical therapy.    Baseline  Provided    Time  2    Period  Weeks    Status  New    Target Date  05/04/18      PT SHORT TERM GOAL #2   Title  Patient will have a NPRS rating of 6/10 or less to represent a clinically significant decrease in pain for improved function at home and in the community.    Baseline  10/10    Time  4    Period  Weeks    Status  New    Target Date  05/18/18        PT Long Term Goals - 04/20/18 0849      PT LONG TERM GOAL #1   Title  Pt will decrease 5TSTS by at least 3 seconds in order to demonstrate clinically significant improvement in LE strength.    Baseline  30.6 sec with BUE support    Time  8    Period  Weeks    Status  New    Target Date  06/15/18      PT LONG TERM GOAL #2   Title  Pt will decrease mODI scoreby at least 13 points in order demonstrate clinically significant reduction in back pain/disability.     Baseline  60%    Time  8    Period  Weeks    Status  New    Target Date  06/15/18      PT LONG TERM GOAL #3   Title  Patient will be able to walk with LRAD for > 5 minutes, symmetrical stride length, and improved foot clearance during swings without increased genu valgum in order to return to her PLOF and decrease risk of falls.    Baseline  Household distances <5 min, SPC, shuffling gait with increased genu valgum    Time  8    Period  Weeks    Status  New    Target Date  06/15/18            Plan - 05/04/18 1130    Clinical Impression Statement  Patient continues to require education on how/why physical therapy will help with her pain/leg weakness but that it will require her compliance at home as well as coming to therapy. Patient is challenged with bed mobility and transfers due to weakened LE's and core. Patient will continue to benefit from skilled therapeutic intervention  to address deficits in strength, ROM, mobility, activity tolerance,  and pain in order to decrease risk of falls and improve overall QOL.    Rehab Potential  Good    PT Frequency  2x / week    PT Duration  8 weeks    PT Treatment/Interventions  Aquatic Therapy;Cryotherapy;Electrical Stimulation;Moist Heat;Stair training;Neuromuscular re-education;Gait training;Balance training;Therapeutic exercise;Therapeutic activities;Patient/family education;Manual techniques;Taping;Dry needling;Passive range of motion;Vestibular    PT Next Visit Plan  add more HEP     PT Home Exercise Plan  pelvic tilts, HLTR (see patient instructions)    Consulted and Agree with Plan of Care  Patient       Patient will benefit from skilled therapeutic intervention in order to improve the following deficits and impairments:  Abnormal gait, Improper body mechanics, Pain, Decreased mobility, Postural dysfunction, Decreased strength, Decreased endurance, Decreased activity tolerance, Decreased balance, Difficulty walking, Impaired sensation  Visit Diagnosis: Chronic midline low back pain, unspecified whether sciatica present  Other abnormalities of gait and mobility  Muscle weakness (generalized)  Difficulty in walking, not elsewhere classified     Problem List Patient Active Problem List   Diagnosis Date Noted  . Thrombocytopenia (HCC) 05/27/2016  . CHF (congestive heart failure) (HCC) 05/26/2016  . Mesenteric vascular insufficiency (HCC) 02/28/2016  . Pancreas cyst 02/28/2016  . Increased endometrial stripe thickness 02/28/2016  . Carotid atherosclerosis, bilateral 01/23/2016  . Hearing loss 08/17/2015  . Mitral regurgitation 04/19/2015  . Pernicious anemia 09/27/2014  . Paroxysmal A-fib (HCC) 09/27/2014  . Benign essential HTN 09/27/2014  . Perennial allergic rhinitis 09/27/2014  . Chronic anemia 09/27/2014  . Insomnia, persistent 09/27/2014  . Hypertensive pulmonary vascular disease (HCC) 09/27/2014  . B12 deficiency 09/27/2014  . Diverticulosis of colon 09/27/2014  .  Dyslipidemia 09/27/2014  . Edema extremities 09/27/2014  . Gastric reflux 09/27/2014  . Bilateral hearing loss 09/27/2014  . Polypharmacy 09/27/2014  . Pelvic pain in female 09/27/2014  . MI (mitral incompetence) 09/27/2014  . Internal hemorrhoids 09/27/2014  . Osteopenia 09/27/2014  . Arthralgia of shoulder 09/27/2014  . Finger tendinitis 09/27/2014  . Urge incontinence 09/27/2014  . Cervical prolapse 09/27/2014  . Vertigo 09/27/2014  . Vitamin D deficiency 09/27/2014  . Bladder cystocele 04/15/2012  . Asymmetric septal hypertrophy (HCC) 05/19/2011  . Hypertrophic cardiomyopathy (HCC) 05/19/2011   Precious Bard, PT, DPT   05/04/2018, 11:34 AM  [place] [facility] MAIN [facility] SERVICES [address] [place], [place], [address] Phone: [phone]   Fax:  [phone]  Name: Sharon Bullock MRN: [id number] Date of Birth: [birth year]

## 2018-05-06 ENCOUNTER — Ambulatory Visit: Payer: Medicare Other

## 2018-05-07 DIAGNOSIS — K862 Cyst of pancreas: Secondary | ICD-10-CM | POA: Diagnosis not present

## 2018-05-07 DIAGNOSIS — Z79899 Other long term (current) drug therapy: Secondary | ICD-10-CM | POA: Diagnosis not present

## 2018-05-11 ENCOUNTER — Ambulatory Visit: Payer: Medicare Other

## 2018-05-11 DIAGNOSIS — R2689 Other abnormalities of gait and mobility: Secondary | ICD-10-CM | POA: Diagnosis not present

## 2018-05-11 DIAGNOSIS — M6281 Muscle weakness (generalized): Secondary | ICD-10-CM | POA: Diagnosis not present

## 2018-05-11 DIAGNOSIS — G8929 Other chronic pain: Secondary | ICD-10-CM | POA: Diagnosis not present

## 2018-05-11 DIAGNOSIS — M545 Low back pain, unspecified: Secondary | ICD-10-CM

## 2018-05-11 DIAGNOSIS — R262 Difficulty in walking, not elsewhere classified: Secondary | ICD-10-CM

## 2018-05-11 NOTE — Therapy (Signed)
[facility] MAIN [facility] SERVICES [address] [place], [place], [address] Phone: [phone]   Fax:  [phone]  Physical Therapy Treatment  Patient Details  Name: Sharon Bullock MRN: [id number] Date of Birth: [birth year] Referring Provider (PT): Faythe Ghee, [place]   Encounter Date: 05/11/2018  PT End of Session - 05/11/18 1248    Visit Number  4    Number of Visits  17    Date for PT Re-Evaluation  06/14/18    Authorization Type  4/10 (eval 04/19/2018)    PT Start Time  0930    PT Stop Time  1014    PT Time Calculation (min)  44 min    Equipment Utilized During Treatment  Gait belt    Activity Tolerance  Patient limited by pain    Behavior During Therapy  [facility] for tasks assessed/performed       Past Medical History:  Diagnosis Date  . Allergy   . Enlarged heart 2000  . GERD (gastroesophageal reflux disease)   . Hypertension   . Insomnia   . Vertigo 2006    Past Surgical History:  Procedure Laterality Date  . BLADDER REPAIR  2014   tact    There were no vitals filed for this visit.  Subjective Assessment - 05/11/18 0932    Subjective  Patient reports back pain has reduced but her hips have been feeling worse. Reports its more soreness. Reports compliance with HEP.     Pertinent History  Per MD note: Symptoms started approximately 3 months ago without any inciting event prior to onset. She does recall, however, that she did a lot of cleaning and moving heavy boxes prior to onset but denies any specific injury during this to cause her pain.    Limitations  Standing;Walking;Lifting;House hold activities;Sitting    How long can you stand comfortably?  >10 min.    How long can you walk comfortably?  >5 min.    Patient Stated Goals  walk better, stairs without any problem    Currently in Pain?  Yes    Pain Score  7     Pain Location  Hip    Pain Orientation  Right;Left    Pain Descriptors / Indicators  Aching;Sore    Pain  Type  Chronic pain    Pain Onset  More than a month ago    Pain Frequency  Intermittent    Aggravating Factors   walking    Pain Relieving Factors  laying down        Supine:  Hamstring stretch BLE 2x 60 seconds each LE, patient leg on PT shoulder guiding into progressive lengthening holds   Piriformis stretch: figure 4 modified L and R with opp LE over knee, support to LE via PT hands and knees for stability x 60 seconds each LE,   Opp knee to chest each LE, extra support provided for patient relaxation with improved muscle tissue lengthening with prolonged hold.   Nerve glides, SLR with leg on PT shoulder, ankle df and pf 20x each LE   LE rotation for reduction of back pain 2 minutes   Short arc distraction with belt: knee/hip flexed inferior distraction: 5x20 second holds ; One leg at a time, BLE   Long arc distraction with belt: inferior distraction 6x20 second holds, one leg at a time, BLE   TrA activation 10x 3 second holds, tactile cueing for activation with verbal encouragement  Posterior pelvic tilts 10x 5 second holds.  Sidelying.:    STM paraspinal musculature and piriformis for muscle tissue lengthening x 5 minutes     Seated: PT guiding into gentle trunk flexion via UE's then back into upright position 10x for back muscle tissue lengthening.   Piriformis stretch via modified figure four positioning with limitations L>R. 60 second holds. BLE    Patient requires Mod A for bed mobility, unable to move LE's independently to transition prone<>supine                         PT Education - 05/11/18 0934    Education Details  HEP compliance, manual, exercise technique     Person(s) Educated  Patient    Methods  Explanation;Demonstration;Tactile cues;Verbal cues    Comprehension  Verbalized understanding;Returned demonstration;Tactile cues required;Need further instruction;Verbal cues required       PT Short Term Goals - 04/20/18 0847       PT SHORT TERM GOAL #1   Title  Patient will be independent with HEP in order to maintain/improve gains made in physical therapy.    Baseline  Provided    Time  2    Period  Weeks    Status  New    Target Date  05/04/18      PT SHORT TERM GOAL #2   Title  Patient will have a NPRS rating of 6/10 or less to represent a clinically significant decrease in pain for improved function at home and in the community.    Baseline  10/10    Time  4    Period  Weeks    Status  New    Target Date  05/18/18        PT Long Term Goals - 04/20/18 0849      PT LONG TERM GOAL #1   Title  Pt will decrease 5TSTS by at least 3 seconds in order to demonstrate clinically significant improvement in LE strength.    Baseline  30.6 sec with BUE support    Time  8    Period  Weeks    Status  New    Target Date  06/15/18      PT LONG TERM GOAL #2   Title  Pt will decrease mODI scoreby at least 13 points in order demonstrate clinically significant reduction in back pain/disability.     Baseline  60%    Time  8    Period  Weeks    Status  New    Target Date  06/15/18      PT LONG TERM GOAL #3   Title  Patient will be able to walk with LRAD for > 5 minutes, symmetrical stride length, and improved foot clearance during swings without increased genu valgum in order to return to her PLOF and decrease risk of falls.    Baseline  Household distances <5 min, SPC, shuffling gait with increased genu valgum    Time  8    Period  Weeks    Status  New    Target Date  06/15/18            Plan - 05/11/18 1249    Clinical Impression Statement  Patient presents with improved back pain allowing focus to be shifting towards symptoms that indicate piriformis involvement. Patient does not tolerate standard piriformis stretches requiring modifications. Patient will continue to benefit from skilled therapeutic intervention to address deficits in strength, ROM, mobility, activity tolerance, and pain in order to  decrease risk of falls and improve overall QOL.    Rehab Potential  Good    PT Frequency  2x / week    PT Duration  8 weeks    PT Treatment/Interventions  Aquatic Therapy;Cryotherapy;Electrical Stimulation;Moist Heat;Stair training;Neuromuscular re-education;Gait training;Balance training;Therapeutic exercise;Therapeutic activities;Patient/family education;Manual techniques;Taping;Dry needling;Passive range of motion;Vestibular    PT Next Visit Plan  add more HEP     PT Home Exercise Plan  pelvic tilts, HLTR (see patient instructions)    Consulted and Agree with Plan of Care  Patient       Patient will benefit from skilled therapeutic intervention in order to improve the following deficits and impairments:  Abnormal gait, Improper body mechanics, Pain, Decreased mobility, Postural dysfunction, Decreased strength, Decreased endurance, Decreased activity tolerance, Decreased balance, Difficulty walking, Impaired sensation  Visit Diagnosis: Chronic midline low back pain, unspecified whether sciatica present  Other abnormalities of gait and mobility  Muscle weakness (generalized)  Difficulty in walking, not elsewhere classified     Problem List Patient Active Problem List   Diagnosis Date Noted  . Thrombocytopenia (HCC) 05/27/2016  . CHF (congestive heart failure) (HCC) 05/26/2016  . Mesenteric vascular insufficiency (HCC) 02/28/2016  . Pancreas cyst 02/28/2016  . Increased endometrial stripe thickness 02/28/2016  . Carotid atherosclerosis, bilateral 01/23/2016  . Hearing loss 08/17/2015  . Mitral regurgitation 04/19/2015  . Pernicious anemia 09/27/2014  . Paroxysmal A-fib (HCC) 09/27/2014  . Benign essential HTN 09/27/2014  . Perennial allergic rhinitis 09/27/2014  . Chronic anemia 09/27/2014  . Insomnia, persistent 09/27/2014  . Hypertensive pulmonary vascular disease (HCC) 09/27/2014  . B12 deficiency 09/27/2014  . Diverticulosis of colon 09/27/2014  . Dyslipidemia  09/27/2014  . Edema extremities 09/27/2014  . Gastric reflux 09/27/2014  . Bilateral hearing loss 09/27/2014  . Polypharmacy 09/27/2014  . Pelvic pain in female 09/27/2014  . MI (mitral incompetence) 09/27/2014  . Internal hemorrhoids 09/27/2014  . Osteopenia 09/27/2014  . Arthralgia of shoulder 09/27/2014  . Finger tendinitis 09/27/2014  . Urge incontinence 09/27/2014  . Cervical prolapse 09/27/2014  . Vertigo 09/27/2014  . Vitamin D deficiency 09/27/2014  . Bladder cystocele 04/15/2012  . Asymmetric septal hypertrophy (HCC) 05/19/2011  . Hypertrophic cardiomyopathy (HCC) 05/19/2011   Precious Bard, PT, DPT   05/11/2018, 12:50 PM  [facility] MAIN [facility] SERVICES [address] [place], [place], [address] Phone: [phone]   Fax:  [phone]  Name: Sharon Bullock MRN: [id number] Date of Birth: 16-Apr-1933

## 2018-05-18 ENCOUNTER — Ambulatory Visit: Payer: Medicare Other | Attending: Physician Assistant

## 2018-05-18 DIAGNOSIS — G8929 Other chronic pain: Secondary | ICD-10-CM | POA: Insufficient documentation

## 2018-05-18 DIAGNOSIS — M6281 Muscle weakness (generalized): Secondary | ICD-10-CM

## 2018-05-18 DIAGNOSIS — R2689 Other abnormalities of gait and mobility: Secondary | ICD-10-CM | POA: Insufficient documentation

## 2018-05-18 DIAGNOSIS — M545 Low back pain, unspecified: Secondary | ICD-10-CM

## 2018-05-18 DIAGNOSIS — R262 Difficulty in walking, not elsewhere classified: Secondary | ICD-10-CM | POA: Diagnosis not present

## 2018-05-18 NOTE — Therapy (Signed)
[facility] MAIN [facility] SERVICES [address] [place], [place], [address] Phone: [phone]   Fax:  [phone]  Physical Therapy Treatment  Patient Details  Name: Sharon Bullock MRN: [id number] Date of Birth: March 28, 1934 Referring Provider (PT): Faythe Ghee, [place]   Encounter Date: 05/18/2018  PT End of Session - 05/18/18 1025    Visit Number  5    Number of Visits  17    Date for PT Re-Evaluation  06/14/18    Authorization Type  5/10 (eval 04/19/2018)    PT Start Time  0931    PT Stop Time  1016    PT Time Calculation (min)  45 min    Equipment Utilized During Treatment  Gait belt    Activity Tolerance  Patient limited by pain    Behavior During Therapy  [facility] for tasks assessed/performed       Past Medical History:  Diagnosis Date  . Allergy   . Enlarged heart 2000  . GERD (gastroesophageal reflux disease)   . Hypertension   . Insomnia   . Vertigo 2006    Past Surgical History:  Procedure Laterality Date  . BLADDER REPAIR  2014   tact    There were no vitals filed for this visit.  Subjective Assessment - 05/18/18 1024    Subjective  Patient reports her back pain is no longer there but the front of each hip (groinish region) is painful upon initial standing. Improved with prolonged walking.     Pertinent History  Per MD note: Symptoms started approximately 3 months ago without any inciting event prior to onset. She does recall, however, that she did a lot of cleaning and moving heavy boxes prior to onset but denies any specific injury during this to cause her pain.    Limitations  Standing;Walking;Lifting;House hold activities;Sitting    How long can you stand comfortably?  >10 min.    How long can you walk comfortably?  >5 min.    Patient Stated Goals  walk better, stairs without any problem    Pain Score  4     Pain Location  Hip    Pain Orientation  Right;Left;Anterior    Pain Descriptors / Indicators  Aching    Pain  Type  Chronic pain    Pain Onset  More than a month ago    Pain Frequency  Intermittent    Aggravating Factors   walking        Supine:    Short arc distraction with belt: knee/hip flexed inferior distraction: 4x20 second holds ; One leg at a time, BLE   Long arc distraction with belt: inferior distraction 4x20 second holds, one leg at a time, BLE   TrA activation with heel slides: verbal and tactile cueing for body  Mechanics and sequencing for quadriceps and hip flexor stretching. 10x each side  TrA activation with hip abduction. Tactile cueing for arc of movement. Patient challenged with maintaining neutral hip alignment resulting in frequent toe out for hip flexor compensatory activation. 10x each LE.    Sidelying.  Hip flexor stretch: support provided to knee with gradual increase in tension provided with slight bend of knee x 60 seconds each LE  Clamshells: tactile cueing for arc of movement, stabilization provided to pelvis, 10x each side.  PA mobilizations hip. Torsion provided to knee with concurrent PA mobilization for optimal roll and glide mechanics Grade I-II. 2 minutes each side  Standing: Hip flexor lunge stretch in //  bars with verbal cueing for positioning and CGA 2x 30 seconds each LE  Ambulate 46 ft with CGA and Hurrycane, improved gait mechanics with increased upright posture, step length and velocity s/p hip flexor lunge stretch.                         PT Education - 05/18/18 1025    Education Details  manual, exercise, hip flexor stretch     Person(s) Educated  Patient    Methods  Explanation;Demonstration;Tactile cues;Verbal cues    Comprehension  Verbalized understanding;Returned demonstration;Verbal cues required;Tactile cues required       PT Short Term Goals - 04/20/18 0847      PT SHORT TERM GOAL #1   Title  Patient will be independent with HEP in order to maintain/improve gains made in physical therapy.    Baseline   Provided    Time  2    Period  Weeks    Status  New    Target Date  05/04/18      PT SHORT TERM GOAL #2   Title  Patient will have a NPRS rating of 6/10 or less to represent a clinically significant decrease in pain for improved function at home and in the community.    Baseline  10/10    Time  4    Period  Weeks    Status  New    Target Date  05/18/18        PT Long Term Goals - 04/20/18 0849      PT LONG TERM GOAL #1   Title  Pt will decrease 5TSTS by at least 3 seconds in order to demonstrate clinically significant improvement in LE strength.    Baseline  30.6 sec with BUE support    Time  8    Period  Weeks    Status  New    Target Date  06/15/18      PT LONG TERM GOAL #2   Title  Pt will decrease mODI scoreby at least 13 points in order demonstrate clinically significant reduction in back pain/disability.     Baseline  60%    Time  8    Period  Weeks    Status  New    Target Date  06/15/18      PT LONG TERM GOAL #3   Title  Patient will be able to walk with LRAD for > 5 minutes, symmetrical stride length, and improved foot clearance during swings without increased genu valgum in order to return to her PLOF and decrease risk of falls.    Baseline  Household distances <5 min, SPC, shuffling gait with increased genu valgum    Time  8    Period  Weeks    Status  New    Target Date  06/15/18            Plan - 05/18/18 1026    Clinical Impression Statement  Patient demonstrates improved mobility with Mod I for bed mobility this session. Hip flexor tightness combined with weak abductors/gluteals/adductors result in frequent compensatory mechanisms during interventions. Patient will continue to benefit from skilled therapeutic intervention to address deficits in strength, ROM, mobility, activity tolerance, and pain in order to decrease risk of falls and improve overall QOL.    Rehab Potential  Good    PT Frequency  2x / week    PT Duration  8 weeks    PT  Treatment/Interventions  Aquatic  Therapy;Cryotherapy;Electrical Stimulation;Moist Heat;Stair training;Neuromuscular re-education;Gait training;Balance training;Therapeutic exercise;Therapeutic activities;Patient/family education;Manual techniques;Taping;Dry needling;Passive range of motion;Vestibular    PT Next Visit Plan  add more HEP     PT Home Exercise Plan  pelvic tilts, HLTR (see patient instructions)    Consulted and Agree with Plan of Care  Patient       Patient will benefit from skilled therapeutic intervention in order to improve the following deficits and impairments:  Abnormal gait, Improper body mechanics, Pain, Decreased mobility, Postural dysfunction, Decreased strength, Decreased endurance, Decreased activity tolerance, Decreased balance, Difficulty walking, Impaired sensation  Visit Diagnosis: Chronic midline low back pain, unspecified whether sciatica present  Other abnormalities of gait and mobility  Muscle weakness (generalized)     Problem List Patient Active Problem List   Diagnosis Date Noted  . Thrombocytopenia (HCC) 05/27/2016  . CHF (congestive heart failure) (HCC) 05/26/2016  . Mesenteric vascular insufficiency (HCC) 02/28/2016  . Pancreas cyst 02/28/2016  . Increased endometrial stripe thickness 02/28/2016  . Carotid atherosclerosis, bilateral 01/23/2016  . Hearing loss 08/17/2015  . Mitral regurgitation 04/19/2015  . Pernicious anemia 09/27/2014  . Paroxysmal A-fib (HCC) 09/27/2014  . Benign essential HTN 09/27/2014  . Perennial allergic rhinitis 09/27/2014  . Chronic anemia 09/27/2014  . Insomnia, persistent 09/27/2014  . Hypertensive pulmonary vascular disease (HCC) 09/27/2014  . B12 deficiency 09/27/2014  . Diverticulosis of colon 09/27/2014  . Dyslipidemia 09/27/2014  . Edema extremities 09/27/2014  . Gastric reflux 09/27/2014  . Bilateral hearing loss 09/27/2014  . Polypharmacy 09/27/2014  . Pelvic pain in female 09/27/2014  . MI (mitral  incompetence) 09/27/2014  . Internal hemorrhoids 09/27/2014  . Osteopenia 09/27/2014  . Arthralgia of shoulder 09/27/2014  . Finger tendinitis 09/27/2014  . Urge incontinence 09/27/2014  . Cervical prolapse 09/27/2014  . Vertigo 09/27/2014  . Vitamin D deficiency 09/27/2014  . Bladder cystocele 04/15/2012  . Asymmetric septal hypertrophy (HCC) 05/19/2011  . Hypertrophic cardiomyopathy (HCC) 05/19/2011   Precious BardMarina Raihan Kimmel, PT, DPT   05/18/2018, 10:27 AM  [facility] MAIN [facility] [address] [place], [place], [address] Phone: [phone]   Fax:  [phone]  Name: Beryle FlockZeola Bullock MRN: [id number] Date of Birth: 05/20/1933

## 2018-05-20 ENCOUNTER — Encounter: Payer: Self-pay | Admitting: Physical Therapy

## 2018-05-20 ENCOUNTER — Ambulatory Visit: Payer: Medicare Other | Admitting: Physical Therapy

## 2018-05-20 DIAGNOSIS — M6281 Muscle weakness (generalized): Secondary | ICD-10-CM

## 2018-05-20 DIAGNOSIS — M545 Low back pain: Principal | ICD-10-CM

## 2018-05-20 DIAGNOSIS — G8929 Other chronic pain: Secondary | ICD-10-CM

## 2018-05-20 DIAGNOSIS — R2689 Other abnormalities of gait and mobility: Secondary | ICD-10-CM

## 2018-05-20 DIAGNOSIS — R262 Difficulty in walking, not elsewhere classified: Secondary | ICD-10-CM | POA: Diagnosis not present

## 2018-05-20 NOTE — Patient Instructions (Signed)
Lunge: Anterior    Arms holding, lunge forward on left leg, keeping knee above ankle, back leg straight, heel on ground. Keep trunk in line with back leg, hold for 30 seconds  Push off front leg to return. Repeat _3___ times per set. Do _1___ sets per session. Do _7___ sessions per week. Use __0Hip Flexor Stretch: Lunge Pose (Wall, Block)    Position block so shin is vertical to heel. Pelvic tilt engaged, reach into back leg, press front knee into block. Hold for _30___ breaths. Repeat ___3_ times each leg.  Copyright  VHI. All rights reserved.  Hip Flexor Stretch    Lying on back near edge of bed, bend one leg, foot flat. Hang other leg over edge, relaxed, thigh resting entirely on bed for __30__ seconds. Repeat __3__ times. Do ___1_ sessions per day. Advanced Exercise: Bend knee back keeping thigh in contact with bed.  http://gt2.exer.us/347   Copyright  VHI. All rights reserved.  __ lb weights.  Copyright  VHI. All rights reserved.

## 2018-05-20 NOTE — Therapy (Signed)
[facility] MAIN [facility] SERVICES [address] [place], [place], [address] Phone: [phone]   Fax:  [phone]  Physical Therapy Treatment  Patient Details  Name: Sharon Bullock MRN: [id number] Date of Birth: 26-Sep-1933 Referring Provider (PT): Faythe Ghee, [place]   Encounter Date: 05/20/2018  PT End of Session - 05/20/18 1013    Visit Number  6    Number of Visits  17    Date for PT Re-Evaluation  06/14/18    Authorization Type  6/10 (eval 04/19/2018)    PT Start Time  1000    PT Stop Time  1045    PT Time Calculation (min)  45 min    Equipment Utilized During Treatment  Gait belt    Activity Tolerance  Patient limited by pain    Behavior During Therapy  [facility] for tasks assessed/performed       Past Medical History:  Diagnosis Date  . Allergy   . Enlarged heart 2000  . GERD (gastroesophageal reflux disease)   . Hypertension   . Insomnia   . Vertigo 2006    Past Surgical History:  Procedure Laterality Date  . BLADDER REPAIR  2014   tact    There were no vitals filed for this visit.  Subjective Assessment - 05/20/18 1011    Subjective  Patient reports her back pain is no longer there but the front of each hip R>L (groinish region) is painful upon initial standing. Improved with prolonged walking.     Pertinent History  Per MD note: Symptoms started approximately 3 months ago without any inciting event prior to onset. She does recall, however, that she did a lot of cleaning and moving heavy boxes prior to onset but denies any specific injury during this to cause her pain.    Limitations  Standing;Walking;Lifting;House hold activities;Sitting    How long can you stand comfortably?  >10 min.    How long can you walk comfortably?  >5 min.    Patient Stated Goals  walk better, stairs without any problem    Currently in Pain?  Yes    Pain Score  6     Pain Location  Hip    Pain Orientation  Right    Pain Descriptors / Indicators   Sore    Pain Type  Chronic pain    Pain Onset  More than a month ago    Pain Onset  More than a month ago            Prone :  Hip flexor stretch: support provided to keep hip from lifting up,  bend  knee x 30 seconds x 3 each LE  Hip flexor stretch: towel under knee,  support provided to keep hip from lifting up,  bend  kne x 30 seconds x 3 each LE  Thomas test position for B hip flex stretch x 30 sec x 3 sets  Supine;  Hooklying hip abd/ER, GTB  x 40   Hooklying marching x 40 BLE   Standing:  Hip flexor lunge stretch in // bars with verbal cueing for positioning and CGA 2x 30 seconds each LE  Ambulate 75 ft with CGA and Hurricane, improved gait mechanics with increased upright posture, step length and velocity s/p hip flexor lunge stretch.    Patient reports B hip pain decreasing following stretching 4/10 , mod VCs for positioning to isolate hip extension for stretching correctly  PT Education - 05/20/18 1013    Education Details  HEP    Person(s) Educated  Patient    Methods  Explanation    Comprehension  Verbalized understanding;Need further instruction;Returned demonstration       PT Short Term Goals - 04/20/18 0847      PT SHORT TERM GOAL #1   Title  Patient will be independent with HEP in order to maintain/improve gains made in physical therapy.    Baseline  Provided    Time  2    Period  Weeks    Status  New    Target Date  05/04/18      PT SHORT TERM GOAL #2   Title  Patient will have a NPRS rating of 6/10 or less to represent a clinically significant decrease in pain for improved function at home and in the community.    Baseline  10/10    Time  4    Period  Weeks    Status  New    Target Date  05/18/18        PT Long Term Goals - 04/20/18 0849      PT LONG TERM GOAL #1   Title  Pt will decrease 5TSTS by at least 3 seconds in order to demonstrate clinically significant improvement in LE strength.     Baseline  30.6 sec with BUE support    Time  8    Period  Weeks    Status  New    Target Date  06/15/18      PT LONG TERM GOAL #2   Title  Pt will decrease mODI scoreby at least 13 points in order demonstrate clinically significant reduction in back pain/disability.     Baseline  60%    Time  8    Period  Weeks    Status  New    Target Date  06/15/18      PT LONG TERM GOAL #3   Title  Patient will be able to walk with LRAD for > 5 minutes, symmetrical stride length, and improved foot clearance during swings without increased genu valgum in order to return to her PLOF and decrease risk of falls.    Baseline  Household distances <5 min, SPC, shuffling gait with increased genu valgum    Time  8    Period  Weeks    Status  New    Target Date  06/15/18            Plan - 05/20/18 1036    Clinical Impression Statement  Patient demonstrates improved stability and strength allowing patient to perform short duration standing interventions with rest periods.  Patient performs standing and supine stretching for improved hip mobility and decreasing pain in B hips.  . Patient fatigues quickly with exercises requiring rest breaks at this time. Patient will continue to benefit from skilled physical therapy to improve pain and mobility.    Rehab Potential  Good    PT Frequency  2x / week    PT Duration  8 weeks    PT Treatment/Interventions  Aquatic Therapy;Cryotherapy;Electrical Stimulation;Moist Heat;Stair training;Neuromuscular re-education;Gait training;Balance training;Therapeutic exercise;Therapeutic activities;Patient/family education;Manual techniques;Taping;Dry needling;Passive range of motion;Vestibular    PT Next Visit Plan  add more HEP     PT Home Exercise Plan  pelvic tilts, HLTR (see patient instructions)    Consulted and Agree with Plan of Care  Patient       Patient will benefit from skilled therapeutic intervention in  order to improve the following deficits and impairments:   Abnormal gait, Improper body mechanics, Pain, Decreased mobility, Postural dysfunction, Decreased strength, Decreased endurance, Decreased activity tolerance, Decreased balance, Difficulty walking, Impaired sensation  Visit Diagnosis: Chronic midline low back pain, unspecified whether sciatica present  Other abnormalities of gait and mobility  Muscle weakness (generalized)  Difficulty in walking, not elsewhere classified     Problem List Patient Active Problem List   Diagnosis Date Noted  . Thrombocytopenia (HCC) 05/27/2016  . CHF (congestive heart failure) (HCC) 05/26/2016  . Mesenteric vascular insufficiency (HCC) 02/28/2016  . Pancreas cyst 02/28/2016  . Increased endometrial stripe thickness 02/28/2016  . Carotid atherosclerosis, bilateral 01/23/2016  . Hearing loss 08/17/2015  . Mitral regurgitation 04/19/2015  . Pernicious anemia 09/27/2014  . Paroxysmal A-fib (HCC) 09/27/2014  . Benign essential HTN 09/27/2014  . Perennial allergic rhinitis 09/27/2014  . Chronic anemia 09/27/2014  . Insomnia, persistent 09/27/2014  . Hypertensive pulmonary vascular disease (HCC) 09/27/2014  . B12 deficiency 09/27/2014  . Diverticulosis of colon 09/27/2014  . Dyslipidemia 09/27/2014  . Edema extremities 09/27/2014  . Gastric reflux 09/27/2014  . Bilateral hearing loss 09/27/2014  . Polypharmacy 09/27/2014  . Pelvic pain in female 09/27/2014  . MI (mitral incompetence) 09/27/2014  . Internal hemorrhoids 09/27/2014  . Osteopenia 09/27/2014  . Arthralgia of shoulder 09/27/2014  . Finger tendinitis 09/27/2014  . Urge incontinence 09/27/2014  . Cervical prolapse 09/27/2014  . Vertigo 09/27/2014  . Vitamin D deficiency 09/27/2014  . Bladder cystocele 04/15/2012  . Asymmetric septal hypertrophy (HCC) 05/19/2011  . Hypertrophic cardiomyopathy (HCC) 05/19/2011    Ezekiel Ina , PT DPT 05/20/2018, 10:50 AM  [facility] MAIN [facility]  SERVICES [address] [place], [place], [address] Phone: [phone]   Fax:  [phone]  Name: Sharon Bullock MRN: [id number] Date of Birth: Aug 31, 1933

## 2018-05-25 ENCOUNTER — Ambulatory Visit: Payer: Medicare Other

## 2018-05-25 DIAGNOSIS — M545 Low back pain, unspecified: Secondary | ICD-10-CM

## 2018-05-25 DIAGNOSIS — M6281 Muscle weakness (generalized): Secondary | ICD-10-CM

## 2018-05-25 DIAGNOSIS — R262 Difficulty in walking, not elsewhere classified: Secondary | ICD-10-CM | POA: Diagnosis not present

## 2018-05-25 DIAGNOSIS — R2689 Other abnormalities of gait and mobility: Secondary | ICD-10-CM

## 2018-05-25 DIAGNOSIS — G8929 Other chronic pain: Secondary | ICD-10-CM | POA: Diagnosis not present

## 2018-05-25 NOTE — Therapy (Signed)
[facility] MAIN [facility] SERVICES [address] [place], [place], [address] Phone: [phone]   Fax:  [phone]  Physical Therapy Treatment  Patient Details  Name: Sharon Bullock MRN: [id number] Date of Birth: April 06, 1934 Referring Provider (PT): Faythe Ghee, [place]   Encounter Date: 05/25/2018  PT End of Session - 05/25/18 1739    Visit Number  7    Number of Visits  17    Date for PT Re-Evaluation  06/14/18    Authorization Type  7/10 (eval 04/19/2018)    PT Start Time  1346    PT Stop Time  1430    PT Time Calculation (min)  44 min    Equipment Utilized During Treatment  Gait belt    Activity Tolerance  Patient limited by pain    Behavior During Therapy  [facility] for tasks assessed/performed       Past Medical History:  Diagnosis Date  . Allergy   . Enlarged heart 2000  . GERD (gastroesophageal reflux disease)   . Hypertension   . Insomnia   . Vertigo 2006    Past Surgical History:  Procedure Laterality Date  . BLADDER REPAIR  2014   tact    There were no vitals filed for this visit.  Subjective Assessment - 05/25/18 1713    Subjective  Patient reports her back pain continues to be resolved but her hips and lateral pelvis is still sore.     Pertinent History  Per MD note: Symptoms started approximately 3 months ago without any inciting event prior to onset. She does recall, however, that she did a lot of cleaning and moving heavy boxes prior to onset but denies any specific injury during this to cause her pain.    Limitations  Standing;Walking;Lifting;House hold activities;Sitting    How long can you stand comfortably?  >10 min.    How long can you walk comfortably?  >5 min.    Patient Stated Goals  walk better, stairs without any problem    Pain Onset  More than a month ago    Pain Onset  More than a month ago      TREATMENT:  Sit to stand from raised plinth table. CGA. Holding onto PT's hands for UE support and relieve  pain in L and R hip flexors. 2x 10 with decreased pain in hip from elevated surface.   Standing weight shift in // bars: BUE support CGA. Slightly more pain on right side. 2 minutes, cues for keeping feet equal for maximal weight shift.   In // bars: forward step and backwards BUE support 10x one side prior to switching to opposite LE, 10x, CGA with verbal cueing for step length for maximal muscle tissue lengthening and contracting.   Ambulate 46 ft with hurrycane and CGA with improved gait mechanics after // bars resulting in increased step length bilaterally and improved upright trunk posture. 2 trials.   supine piriformis stretch; 2x 60 second holds each LE, challenging to find position to stretch yet is painfree initially, able to find with gradual increase to muscle tension   Supine hamstring stretch 2x 60 second holds each LE   Supine LAD: 5x 20 second holds in multiple plains for pain relief. BLE   Patient requires verbal and visual cueing for body mechanics, posture, and safety awareness with CGA for all standing interventions.                      PT  Education - 05/25/18 1738    Education Details  exercise technique, stability, hip mechanics     Person(s) Educated  Patient    Methods  Explanation;Demonstration;Tactile cues;Verbal cues    Comprehension  Verbalized understanding;Returned demonstration;Verbal cues required;Tactile cues required;Need further instruction       PT Short Term Goals - 04/20/18 0847      PT SHORT TERM GOAL #1   Title  Patient will be independent with HEP in order to maintain/improve gains made in physical therapy.    Baseline  Provided    Time  2    Period  Weeks    Status  New    Target Date  05/04/18      PT SHORT TERM GOAL #2   Title  Patient will have a NPRS rating of 6/10 or less to represent a clinically significant decrease in pain for improved function at home and in the community.    Baseline  10/10    Time  4     Period  Weeks    Status  New    Target Date  05/18/18        PT Long Term Goals - 04/20/18 0849      PT LONG TERM GOAL #1   Title  Pt will decrease 5TSTS by at least 3 seconds in order to demonstrate clinically significant improvement in LE strength.    Baseline  30.6 sec with BUE support    Time  8    Period  Weeks    Status  New    Target Date  06/15/18      PT LONG TERM GOAL #2   Title  Pt will decrease mODI scoreby at least 13 points in order demonstrate clinically significant reduction in back pain/disability.     Baseline  60%    Time  8    Period  Weeks    Status  New    Target Date  06/15/18      PT LONG TERM GOAL #3   Title  Patient will be able to walk with LRAD for > 5 minutes, symmetrical stride length, and improved foot clearance during swings without increased genu valgum in order to return to her PLOF and decrease risk of falls.    Baseline  Household distances <5 min, SPC, shuffling gait with increased genu valgum    Time  8    Period  Weeks    Status  New    Target Date  06/15/18            Plan - 05/25/18 1746    Clinical Impression Statement  Patient progressing to standing interventions with continued focus on stability, LE strength, and pain reduction. Patient demonstrated ability to perform sit to stand from raised surface with minimal pain however upon lowering surface pain returned to hip from increased congruency of joint. Patient will continue to benefit from skilled therapeutic intervention to address deficits in strength, ROM, mobility, activity tolerance, and pain in order to decrease risk of falls and improve overall QOL.    Rehab Potential  Good    PT Frequency  2x / week    PT Duration  8 weeks    PT Treatment/Interventions  Aquatic Therapy;Cryotherapy;Electrical Stimulation;Moist Heat;Stair training;Neuromuscular re-education;Gait training;Balance training;Therapeutic exercise;Therapeutic activities;Patient/family education;Manual  techniques;Taping;Dry needling;Passive range of motion;Vestibular    PT Next Visit Plan  add more HEP     PT Home Exercise Plan  pelvic tilts, HLTR (see patient instructions)  Consulted and Agree with Plan of Care  Patient       Patient will benefit from skilled therapeutic intervention in order to improve the following deficits and impairments:  Abnormal gait, Improper body mechanics, Pain, Decreased mobility, Postural dysfunction, Decreased strength, Decreased endurance, Decreased activity tolerance, Decreased balance, Difficulty walking, Impaired sensation  Visit Diagnosis: Chronic midline low back pain, unspecified whether sciatica present  Other abnormalities of gait and mobility  Muscle weakness (generalized)  Difficulty in walking, not elsewhere classified     Problem List Patient Active Problem List   Diagnosis Date Noted  . Thrombocytopenia (HCC) 05/27/2016  . CHF (congestive heart failure) (HCC) 05/26/2016  . Mesenteric vascular insufficiency (HCC) 02/28/2016  . Pancreas cyst 02/28/2016  . Increased endometrial stripe thickness 02/28/2016  . Carotid atherosclerosis, bilateral 01/23/2016  . Hearing loss 08/17/2015  . Mitral regurgitation 04/19/2015  . Pernicious anemia 09/27/2014  . Paroxysmal A-fib (HCC) 09/27/2014  . Benign essential HTN 09/27/2014  . Perennial allergic rhinitis 09/27/2014  . Chronic anemia 09/27/2014  . Insomnia, persistent 09/27/2014  . Hypertensive pulmonary vascular disease (HCC) 09/27/2014  . B12 deficiency 09/27/2014  . Diverticulosis of colon 09/27/2014  . Dyslipidemia 09/27/2014  . Edema extremities 09/27/2014  . Gastric reflux 09/27/2014  . Bilateral hearing loss 09/27/2014  . Polypharmacy 09/27/2014  . Pelvic pain in female 09/27/2014  . MI (mitral incompetence) 09/27/2014  . Internal hemorrhoids 09/27/2014  . Osteopenia 09/27/2014  . Arthralgia of shoulder 09/27/2014  . Finger tendinitis 09/27/2014  . Urge incontinence  09/27/2014  . Cervical prolapse 09/27/2014  . Vertigo 09/27/2014  . Vitamin D deficiency 09/27/2014  . Bladder cystocele 04/15/2012  . Asymmetric septal hypertrophy (HCC) 05/19/2011  . Hypertrophic cardiomyopathy (HCC) 05/19/2011   Precious Bard, PT, DPT   05/25/2018, 5:54 PM  [facility] MAIN [facility] SERVICES [address] [place], [place], [address] Phone: [phone]   Fax:  [phone]  Name: Sharon Bullock MRN: [id number] Date of Birth: [birth year]

## 2018-05-27 ENCOUNTER — Ambulatory Visit: Payer: Medicare Other

## 2018-05-27 DIAGNOSIS — M545 Low back pain: Secondary | ICD-10-CM | POA: Diagnosis not present

## 2018-05-27 DIAGNOSIS — M6281 Muscle weakness (generalized): Secondary | ICD-10-CM | POA: Diagnosis not present

## 2018-05-27 DIAGNOSIS — G8929 Other chronic pain: Secondary | ICD-10-CM

## 2018-05-27 DIAGNOSIS — R262 Difficulty in walking, not elsewhere classified: Secondary | ICD-10-CM | POA: Diagnosis not present

## 2018-05-27 DIAGNOSIS — R2689 Other abnormalities of gait and mobility: Secondary | ICD-10-CM | POA: Diagnosis not present

## 2018-05-27 NOTE — Therapy (Addendum)
[facility] MAIN [facility] [address] [place], [place], [address] Phone: [phone]   Fax:  [phone]  Physical Therapy Treatment  Patient Details  Name: Sharon Bullock MRN: [id number] Date of Birth: 08/20/1933 Referring Provider (PT): Faythe Gheeanilkowicz, Patricia, [place]   Encounter Date: 05/27/2018  PT End of Session - 05/27/18 1441    Visit Number  8    Number of Visits  17    Date for PT Re-Evaluation  06/14/18    Authorization Type  8/10 (eval 04/19/2018)    PT Start Time  1348    PT Stop Time  1430    PT Time Calculation (min)  42 min    Equipment Utilized During Treatment  Gait belt    Activity Tolerance  Patient tolerated treatment well;Patient limited by pain    Behavior During Therapy  [facility] tasks assessed/per[facility]       Past Medical History:  Diagnosis Date  . Allergy   . Enlarged heart 2000  . GERD (gastroesophageal reflux disease)   . Hypertension   . Insomnia   . Vertigo 2006    Past Surgical History:  Procedure Laterality Date  . BLADDER REPAIR  2014   tact    There were no vitals filed for this visit.  Subjective Assessment - 05/27/18 1438    Subjective  The patient reports that she is doing well today. She thinks that some of her R hip pain is because she is sleeping on her right side (used to have L hip pain from sleeping on her left side). She also reports intermittent bilateral leg cramping when she lays down (onset > 6 weeks), and questions if she is drinking enough water. Patient denies stumbles/falls since last visit. At the end of the session, patient reports that her R hip pain has decreased to 3/10 and that she feels some relief. Patient denies back pain today.    Pertinent History  Per MD note: Symptoms started approximately 3 months ago without any inciting event prior to onset. She does recall, however, that she did a lot of cleaning and moving heavy boxes prior to onset but denies any specific injury  during this to cause her pain.    Limitations  Standing;Walking;Lifting;House hold activities;Sitting    How long can you stand comfortably?  >10 min.    How long can you walk comfortably?  >5 min.    Patient Stated Goals  walk better, stairs without any problem    Currently in Pain?  Yes    Pain Score  6     Pain Location  Hip    Pain Orientation  Right    Pain Descriptors / Indicators  Aching;Sore    Pain Type  Chronic pain    Pain Onset  More than a month ago    Pain Frequency  Intermittent    Aggravating Factors   walking/physical activity    Pain Onset  More than a month ago        Therapeutic Exercise (all performed in parallel bars with CGA for balance/stability):   -Standing hip flexor stretch with bilateral UE support for balance 1x60 seconds each leg -Standing abductor stretch with bilateral UE support for balance 2x45 seconds each leg  -Standing weight shift in // bars: No reports of increased pain on either side; 1x2 minutes, cues for keeping feet equal for maximal weight shift; patient benefits from verbal cues to slowly increase amount of weight shifted from side to side  throughout duration of exercise  -In // bars: forward step and backwards BUE support 10x one side prior to switching to opposite LE, 10x; patient benefits from verbal cueing for step length for maximal muscle tissue lengthening and contracting, as well as verbal cues to keep head up and eyes forward (noted slightly decreased step length with LLE as compared to RLE)  -Airex pad: normal stance balance for hip stability 1x45 seconds (patient reports that it is easy) --> Airex pad: feet together stance balance for hip stability 1x45 seconds (patient reports that it is medium difficulty); patient benefits from intermittent verbal cues to keep head up/eyes forward  -Lateral stepping in parallel bars 3x down and back for hip abductor strength and to promote lateral weight shifting; patient benefits from  intermittent verbal cues to keep head up/eyes forward  -Walk backwards in parallel bars 2x down and back for hip extensor strength; patient reports that it is initially difficult because her brain 'wants her to go forward', but patient gains a good rhythm by end of first lap; patient requires intermittent verbal cue to take larger backwards step with LLE  -Supine hamstring stretch 1x 60 second holds each LE -Supine hamstring stretch with patient performing dorsiflexion/plantarflexion for nerve floss 1x60 second each LE; patient states that it feels 'good'   -Supine LAD: 1x40 second hold BLE for pain relief.                  PT Education - 05/27/18 1440    Education Details  exercise technique, stability, keeping head up with mobility, sleeping with pillow between knees for hip relief    Person(s) Educated  Patient    Methods  Explanation;Demonstration;Tactile cues;Verbal cues;Handout    Comprehension  Verbalized understanding;Returned demonstration;Verbal cues required;Tactile cues required;Need further instruction       PT Short Term Goals - 04/20/18 0847      PT SHORT TERM GOAL #1   Title  Patient will be independent with HEP in order to maintain/improve gains made in physical therapy.    Baseline  Provided    Time  2    Period  Weeks    Status  New    Target Date  05/04/18      PT SHORT TERM GOAL #2   Title  Patient will have a NPRS rating of 6/10 or less to represent a clinically significant decrease in pain for improved function at home and in the community.    Baseline  10/10    Time  4    Period  Weeks    Status  New    Target Date  05/18/18        PT Long Term Goals - 04/20/18 0849      PT LONG TERM GOAL #1   Title  Pt will decrease 5TSTS by at least 3 seconds in order to demonstrate clinically significant improvement in LE strength.    Baseline  30.6 sec with BUE support    Time  8    Period  Weeks    Status  New    Target Date  06/15/18       PT LONG TERM GOAL #2   Title  Pt will decrease mODI scoreby at least 13 points in order demonstrate clinically significant reduction in back pain/disability.     Baseline  60%    Time  8    Period  Weeks    Status  New    Target Date  06/15/18  PT LONG TERM GOAL #3   Title  Patient will be able to walk with LRAD for > 5 minutes, symmetrical stride length, and improved foot clearance during swings without increased genu valgum in order to return to her PLOF and decrease risk of falls.    Baseline  Household distances <5 min, SPC, shuffling gait with increased genu valgum    Time  8    Period  Weeks    Status  New    Target Date  06/15/18            Plan - 05/27/18 1446    Clinical Impression Statement  The patient progressed to more challenging standing interventions this session and tolerated each intervention well without significant fatigue or increased pain. The patient demonstrated increased RLE weight bearing without increased pain during lateral weight shifting, backwards walking, and sidestepping in the parallel bars. However, the patient continues to limited in terms of exercise progression due to pain and general BLE weakness. The patient will continue to benefit from skilled PT in order to work towards goals, to decrease pain, and to improve strength and activity tolerance to maximize safety and independence with functional mobility.    Rehab Potential  Good    PT Frequency  2x / week    PT Duration  8 weeks    PT Treatment/Interventions  Aquatic Therapy;Cryotherapy;Electrical Stimulation;Moist Heat;Stair training;Neuromuscular re-education;Gait training;Balance training;Therapeutic exercise;Therapeutic activities;Patient/family education;Manual techniques;Taping;Dry needling;Passive range of motion;Vestibular    PT Next Visit Plan  add more HEP, calf stretching/strengthening, progress strengthening exercises     PT Home Exercise Plan  pelvic tilts, HLTR (see patient  instructions)    Consulted and Agree with Plan of Care  Patient       Patient will benefit from skilled therapeutic intervention in order to improve the following deficits and impairments:  Abnormal gait, Improper body mechanics, Pain, Decreased mobility, Postural dysfunction, Decreased strength, Decreased endurance, Decreased activity tolerance, Decreased balance, Difficulty walking, Impaired sensation  Visit Diagnosis: Chronic midline low back pain, unspecified whether sciatica present  Other abnormalities of gait and mobility  Muscle weakness (generalized)  Difficulty in walking, not elsewhere classified     Problem List Patient Active Problem List   Diagnosis Date Noted  . Thrombocytopenia (HCC) 05/27/2016  . CHF (congestive heart failure) (HCC) 05/26/2016  . Mesenteric vascular insufficiency (HCC) 02/28/2016  . Pancreas cyst 02/28/2016  . Increased endometrial stripe thickness 02/28/2016  . Carotid atherosclerosis, bilateral 01/23/2016  . Hearing loss 08/17/2015  . Mitral regurgitation 04/19/2015  . Pernicious anemia 09/27/2014  . Paroxysmal A-fib (HCC) 09/27/2014  . Benign essential HTN 09/27/2014  . Perennial allergic rhinitis 09/27/2014  . Chronic anemia 09/27/2014  . Insomnia, persistent 09/27/2014  . Hypertensive pulmonary vascular disease (HCC) 09/27/2014  . B12 deficiency 09/27/2014  . Diverticulosis of colon 09/27/2014  . Dyslipidemia 09/27/2014  . Edema extremities 09/27/2014  . Gastric reflux 09/27/2014  . Bilateral hearing loss 09/27/2014  . Polypharmacy 09/27/2014  . Pelvic pain in female 09/27/2014  . MI (mitral incompetence) 09/27/2014  . Internal hemorrhoids 09/27/2014  . Osteopenia 09/27/2014  . Arthralgia of shoulder 09/27/2014  . Finger tendinitis 09/27/2014  . Urge incontinence 09/27/2014  . Cervical prolapse 09/27/2014  . Vertigo 09/27/2014  . Vitamin D deficiency 09/27/2014  . Bladder cystocele 04/15/2012  . Asymmetric septal  hypertrophy (HCC) 05/19/2011  . Hypertrophic cardiomyopathy (HCC) 05/19/2011   Sandra Cockayne, SPT  This entire session was performed under direct supervision and direction of a licensed therapist/therapist  assistant . I have personally read, edited and approve of the note as written.  Precious Bard, PT, DPT   05/27/2018, 6:31 PM  [place] [facility] MAIN [facility] SERVICES [address] [place], [place], [address] Phone: [phone]   Fax:  [phone]  Name: Sharon Bullock MRN: [id number] Date of Birth: 10-05-1933

## 2018-05-30 ENCOUNTER — Other Ambulatory Visit: Payer: Self-pay | Admitting: Family Medicine

## 2018-05-30 DIAGNOSIS — K219 Gastro-esophageal reflux disease without esophagitis: Secondary | ICD-10-CM

## 2018-05-30 DIAGNOSIS — I10 Essential (primary) hypertension: Secondary | ICD-10-CM

## 2018-06-01 ENCOUNTER — Ambulatory Visit: Payer: Medicare Other

## 2018-06-01 DIAGNOSIS — G8929 Other chronic pain: Secondary | ICD-10-CM

## 2018-06-01 DIAGNOSIS — R2689 Other abnormalities of gait and mobility: Secondary | ICD-10-CM | POA: Diagnosis not present

## 2018-06-01 DIAGNOSIS — M6281 Muscle weakness (generalized): Secondary | ICD-10-CM | POA: Diagnosis not present

## 2018-06-01 DIAGNOSIS — M545 Low back pain, unspecified: Secondary | ICD-10-CM

## 2018-06-01 DIAGNOSIS — R262 Difficulty in walking, not elsewhere classified: Secondary | ICD-10-CM

## 2018-06-01 NOTE — Therapy (Addendum)
[facility] MAIN [facility] SERVICES [address] [place], [place], [address] Phone: [phone]   Fax:  [phone]  Physical Therapy Treatment  Patient Details  Name: Sharon Bullock MRN: [id number] Date of Birth: [birth year] Referring Provider (PT): Faythe Ghee, [place]   Encounter Date: 06/01/2018  PT End of Session - 06/01/18 1443    Visit Number  9    Number of Visits  17    Date for PT Re-Evaluation  06/14/18    Authorization Type  9/10 (eval 04/19/2018)    PT Start Time  1358    PT Stop Time  1430    PT Time Calculation (min)  32 min    Equipment Utilized During Treatment  Gait belt    Activity Tolerance  Patient tolerated treatment well;Patient limited by pain    Behavior During Therapy  [facility] for tasks assessed/performed       Past Medical History:  Diagnosis Date  . Allergy   . Enlarged heart 2000  . GERD (gastroesophageal reflux disease)   . Hypertension   . Insomnia   . Vertigo 2006    Past Surgical History:  Procedure Laterality Date  . BLADDER REPAIR  2014   tact    There were no vitals filed for this visit.  Subjective Assessment - 06/01/18 1439    Subjective  Patient reports that she is doing OK today. She notes that she is still having R hip pain/discomfort, but denies L hip and back pain. She notes that she tried sleeping with a pillow between her knees, and thinks that it has helped a very little bit. She notes that she has difficulty performing sit to stands from different surfaces and wishes to focus on that in future sessions.    Pertinent History  Per MD note: Symptoms started approximately 3 months ago without any inciting event prior to onset. She does recall, however, that she did a lot of cleaning and moving heavy boxes prior to onset but denies any specific injury during this to cause her pain.    Limitations  Standing;Walking;Lifting;House hold activities;Sitting    How long can you stand comfortably?  >10  min.    How long can you walk comfortably?  >5 min.    Patient Stated Goals  walk better, stairs without any problem    Currently in Pain?  Yes    Pain Score  6     Pain Location  Hip    Pain Orientation  Right    Pain Descriptors / Indicators  Aching;Sore    Pain Type  Chronic pain    Pain Onset  More than a month ago    Pain Frequency  Intermittent    Pain Onset  More than a month ago       Therapeutic Exercise   -Supine hamstring stretch 1x 60 second holds each LE  -Supine hamstring stretch with patient performing dorsiflexion/plantarflexion for nerve floss 1x60 second each LE  -Supine long arc hip distraction: 2x30 second hold BLE for pain relief  -Supine short arc hip distraction: 2x30 second hold BLE for pain relief  -Attempted supine LLE piriformis/figure 4 stretch; deferred at this time due to increase L hip pain/discomfort upon achieving position  -Seated clamshells against red TB 2x10  (all of following performed in parallel bars with CGA and gait belt for balance/stability):    -Standing lateral weight: No reports of increased pain on either side; 1x2 minutes, cues for keeping feet equal for maximal  weight shift; patient benefits from verbal cues to slowly increase amount of weight shifted from side to side throughout duration of exercise  -Standing hip abduction 1x10 BLE; patient requires frequent verbal cues to move slowly and to keep toes pointed forward; patient demonstrates increased difficulty with keeping toe forward and increased fatigue with RLE   -Standing hip abductor/TFL stretch 1x60 each side; patient notes that she feels a good stretch during performance on each side, respectively  -Ambulates 2x46 feet in gym with CGA and gait belt donned using SPC; patient demonstrates decreased step length/gait speed and minimal trendelenburg gait from R side due to hip abductor weakness    **Of note, patient presented to session more than 10 minutes late, limiting  session duration.                       PT Education - 06/01/18 1443    Education Details  exercise technique, stretching, stability, weight shifting    Person(s) Educated  Patient    Methods  Explanation;Demonstration;Tactile cues;Verbal cues    Comprehension  Verbalized understanding;Returned demonstration;Verbal cues required;Tactile cues required;Need further instruction       PT Short Term Goals - 04/20/18 0847      PT SHORT TERM GOAL #1   Title  Patient will be independent with HEP in order to maintain/improve gains made in physical therapy.    Baseline  Provided    Time  2    Period  Weeks    Status  New    Target Date  05/04/18      PT SHORT TERM GOAL #2   Title  Patient will have a NPRS rating of 6/10 or less to represent a clinically significant decrease in pain for improved function at home and in the community.    Baseline  10/10    Time  4    Period  Weeks    Status  New    Target Date  05/18/18        PT Long Term Goals - 04/20/18 0849      PT LONG TERM GOAL #1   Title  Pt will decrease 5TSTS by at least 3 seconds in order to demonstrate clinically significant improvement in LE strength.    Baseline  30.6 sec with BUE support    Time  8    Period  Weeks    Status  New    Target Date  06/15/18      PT LONG TERM GOAL #2   Title  Pt will decrease mODI scoreby at least 13 points in order demonstrate clinically significant reduction in back pain/disability.     Baseline  60%    Time  8    Period  Weeks    Status  New    Target Date  06/15/18      PT LONG TERM GOAL #3   Title  Patient will be able to walk with LRAD for > 5 minutes, symmetrical stride length, and improved foot clearance during swings without increased genu valgum in order to return to her PLOF and decrease risk of falls.    Baseline  Household distances <5 min, SPC, shuffling gait with increased genu valgum    Time  8    Period  Weeks    Status  New    Target Date   06/15/18            Plan - 06/01/18 1502    Clinical Impression  Statement  The patient has ultimately progressed to standing interventions with continuing focus on BLE strength, pain reduction, and stability. The patient continues to present to PT sessions with 6/10 R hip pain that decreases to a 3-4/10 by the end of the session, indicating the importance of stretching and strengthening interventions. The patient demonstrates observable weakness and fatigue of R hip abductors during performance of exercises and with ambulation today, and thus strengthening that muscle group should continue to be an area of focus moving forward. The patient will continue to benefit from skilled PT in order to work towards goals, to decrease pain, and to improve strength and activity tolerance to maximize safety and independence with functional mobility.     Rehab Potential  Good    PT Frequency  2x / week    PT Duration  8 weeks    PT Treatment/Interventions  Aquatic Therapy;Cryotherapy;Electrical Stimulation;Moist Heat;Stair training;Neuromuscular re-education;Gait training;Balance training;Therapeutic exercise;Therapeutic activities;Patient/family education;Manual techniques;Taping;Dry needling;Passive range of motion;Vestibular    PT Next Visit Plan  reassess goals; practice and progress sit to stands; progress BLE strength    PT Home Exercise Plan  pelvic tilts, HLTR (see patient instructions)    Consulted and Agree with Plan of Care  Patient       Patient will benefit from skilled therapeutic intervention in order to improve the following deficits and impairments:  Abnormal gait, Improper body mechanics, Pain, Decreased mobility, Postural dysfunction, Decreased strength, Decreased endurance, Decreased activity tolerance, Decreased balance, Difficulty walking, Impaired sensation  Visit Diagnosis: Chronic midline low back pain, unspecified whether sciatica present  Other abnormalities of gait and  mobility  Muscle weakness (generalized)  Difficulty in walking, not elsewhere classified     Problem List Patient Active Problem List   Diagnosis Date Noted  . Thrombocytopenia (HCC) 05/27/2016  . CHF (congestive heart failure) (HCC) 05/26/2016  . Mesenteric vascular insufficiency (HCC) 02/28/2016  . Pancreas cyst 02/28/2016  . Increased endometrial stripe thickness 02/28/2016  . Carotid atherosclerosis, bilateral 01/23/2016  . Hearing loss 08/17/2015  . Mitral regurgitation 04/19/2015  . Pernicious anemia 09/27/2014  . Paroxysmal A-fib (HCC) 09/27/2014  . Benign essential HTN 09/27/2014  . Perennial allergic rhinitis 09/27/2014  . Chronic anemia 09/27/2014  . Insomnia, persistent 09/27/2014  . Hypertensive pulmonary vascular disease (HCC) 09/27/2014  . B12 deficiency 09/27/2014  . Diverticulosis of colon 09/27/2014  . Dyslipidemia 09/27/2014  . Edema extremities 09/27/2014  . Gastric reflux 09/27/2014  . Bilateral hearing loss 09/27/2014  . Polypharmacy 09/27/2014  . Pelvic pain in female 09/27/2014  . MI (mitral incompetence) 09/27/2014  . Internal hemorrhoids 09/27/2014  . Osteopenia 09/27/2014  . Arthralgia of shoulder 09/27/2014  . Finger tendinitis 09/27/2014  . Urge incontinence 09/27/2014  . Cervical prolapse 09/27/2014  . Vertigo 09/27/2014  . Vitamin D deficiency 09/27/2014  . Bladder cystocele 04/15/2012  . Asymmetric septal hypertrophy (HCC) 05/19/2011  . Hypertrophic cardiomyopathy (HCC) 05/19/2011   Sandra Cockayne, SPT  This entire session was performed under direct supervision and direction of a licensed therapist/therapist assistant . I have personally read, edited and approve of the note as written.  Precious Bard, PT, DPT    06/01/2018, 4:08 PM  [place] [facility] [facility] MAIN [facility] SERVICES [address] [place], [place], [address] Phone: [phone]   Fax:  [phone]  Name: Sharon Bullock MRN: [id number] Date of  Birth: November 16, 1933

## 2018-06-03 ENCOUNTER — Encounter: Payer: Self-pay | Admitting: Physical Therapy

## 2018-06-03 ENCOUNTER — Ambulatory Visit: Payer: Medicare Other

## 2018-06-03 DIAGNOSIS — R2689 Other abnormalities of gait and mobility: Secondary | ICD-10-CM | POA: Diagnosis not present

## 2018-06-03 DIAGNOSIS — M6281 Muscle weakness (generalized): Secondary | ICD-10-CM

## 2018-06-03 DIAGNOSIS — M545 Low back pain, unspecified: Secondary | ICD-10-CM

## 2018-06-03 DIAGNOSIS — R262 Difficulty in walking, not elsewhere classified: Secondary | ICD-10-CM

## 2018-06-03 DIAGNOSIS — G8929 Other chronic pain: Secondary | ICD-10-CM

## 2018-06-03 NOTE — Patient Instructions (Signed)
Access Code: [email or web address]  URL: [email or web address]  Date: 06/03/2018  Prepared by: Olga Coaster   Exercises  Supine Lower Trunk Rotation - 6 reps - 10 hold - 1x daily - 7x weekly  Supine Posterior Pelvic Tilt - 20 reps - 1x daily - 7x weekly  Sit to Stand with Armchair - 5 reps - 1 sets - 1x daily - 7x weekly  Seated Hamstring Stretch - 3 reps - 1 sets - 60 hold - 1x daily - 7x weekly  Supine Single Knee to Chest - 3 reps - 1 sets - 30 hold - 1x daily - 7x weekly  Seated March - 10 reps - 1 sets - 1x daily - 7x weekly

## 2018-06-03 NOTE — Therapy (Signed)
[facility] MAIN [facility] SERVICES [address] [place], [place], [address] Phone: [phone]   Fax:  [phone]  Physical Therapy Treatment and Progress Note   Dates of reporting period  04/19/2018   to  06/03/2018   Patient Details  Name: Sharon Bullock MRN: [id number] Date of Birth: 1933-04-17 Referring Provider (PT): Faythe Ghee, [place]   Encounter Date: 06/03/2018  PT End of Session - 06/05/18 0746    Visit Number  10    Number of Visits  17    Date for PT Re-Evaluation  06/14/18    Authorization Type  10/10 (eval 04/19/2018)    Authorization - Visit Number  10    Authorization - Number of Visits  17    PT Start Time  1350    PT Stop Time  1430    PT Time Calculation (min)  40 min    Activity Tolerance  Patient tolerated treatment well    Behavior During Therapy  [facility] for tasks assessed/performed       Past Medical History:  Diagnosis Date  . Allergy   . Enlarged heart 2000  . GERD (gastroesophageal reflux disease)   . Hypertension   . Insomnia   . Vertigo 2006    Past Surgical History:  Procedure Laterality Date  . BLADDER REPAIR  2014   tact    There were no vitals filed for this visit.     06/03/18 1351  Symptoms/Limitations  Subjective Patient complains of R sided hip and back pain. L side does not hurt as bad today.   Pertinent History Per MD note: Symptoms started approximately 3 months ago without any inciting event prior to onset. She does recall, however, that she did a lot of cleaning and moving heavy boxes prior to onset but denies any specific injury during this to cause her pain.  Limitations Standing;Walking;Lifting;House hold activities;Sitting  How long can you stand comfortably? >10 min.  How long can you walk comfortably? >5 min.  Patient Stated Goals walk better, stairs without any problem  Pain Assessment  Currently in Pain? Yes  Pain Score 6  Pain Location Hip  Pain Orientation Right  Pain  Descriptors / Indicators Aching;Burning;Sore  Pain Type Chronic pain  Pain Onset More than a month ago    TREATMENT: Goals reassessed this session. PT and pt also discussed HEP (administered updated hand out), PT role, pt progress  mODI: (~28minutes)    Therapeutic Exercise  Supine Lower Trunk Rotation - 5 reps -3 hold to review form  Supine Posterior Pelvic Tilt - 5 reps to review form Seated Hamstring Stretch - 3 reps x 60sec hold  Supine Single Knee to Chest - 3 reps - 1 sets - 30 hold  Seated March - 10 reps - 1 sets     06/03/18 1350  PT Education  Education Details exercise technique, HEP, POC, progress  Person(s) Educated Patient  Methods Explanation;Demonstration;Verbal cues  Comprehension Verbalized understanding;Returned demonstration       PT Short Term Goals - 06/03/18 1354      PT SHORT TERM GOAL #1   Title  Patient will be independent with HEP in order to maintain/improve gains made in physical therapy.    Baseline  Provided ; updated 06/03/2018    Time  4    Period  Weeks    Status  New    Target Date  07/01/18      PT SHORT TERM GOAL #2   Title  Patient will have a NPRS rating of 6/10 or less to represent a clinically significant decrease in pain for improved function at home and in the community.    Baseline  10/10 ; 6/10 i nthe last 3 weeks    Time  4    Period  Weeks    Status  Achieved        PT Long Term Goals - 06/03/18 1402      PT LONG TERM GOAL #1   Title  Pt will decrease 5TSTS by at least 3 seconds in order to demonstrate clinically significant improvement in LE strength.    Baseline  30.6 sec with BUE support; 25.6 sec with UE support on knees    Time  4    Period  Weeks    Status  On-going    Target Date  07/01/18      PT LONG TERM GOAL #2   Title  Pt will decrease mODI scoreby at least 13 points in order demonstrate clinically significant reduction in back pain/disability.     Baseline  60%; 46% 2/20     Time  4    Period   Weeks    Status  New    Target Date  07/01/18      PT LONG TERM GOAL #3   Title  Patient will be able to walk with LRAD for > 5 minutes, symmetrical stride length, and improved foot clearance during swings without increased genu valgum in order to return to her PLOF and decrease risk of falls.    Baseline  Household distances <5 min, SPC, shuffling gait with increased genu valgum;    Time  4    Period  Weeks    Status  On-going    Target Date  07/01/18          06/03/18 1357  Plan  Clinical Impression Statement Patient goals reassessed this not, demonstrated mild improvement. Patient HEP updated as well this session to promote self maintanence of condition. Overall patient exhibited improved activity tolerance, strength, endurance and decreased overall pain levels. The patient would benefit from further skilled PT to continue to address remaining deficits to continue to improve ability to perform functional activities and pain management.   Pt will benefit from skilled therapeutic intervention in order to improve on the following deficits Abnormal gait;Improper body mechanics;Pain;Decreased mobility;Postural dysfunction;Decreased strength;Decreased endurance;Decreased activity tolerance;Decreased balance;Difficulty walking;Impaired sensation  Rehab Potential Good  PT Frequency 2x / week  PT Duration 8 weeks  PT Treatment/Interventions Aquatic Therapy;Cryotherapy;Electrical Stimulation;Moist Heat;Stair training;Neuromuscular re-education;Gait training;Balance training;Therapeutic exercise;Therapeutic activities;Patient/family education;Manual techniques;Taping;Dry needling;Passive range of motion;Vestibular  PT Next Visit Plan reassess goals; practice and progress sit to stands; progress BLE strength  PT Home Exercise Plan see patient instructions to see HEP update  Consulted and Agree with Plan of Care Patient      Patient will benefit from skilled therapeutic intervention in order to  improve the following deficits and impairments:  Abnormal gait, Improper body mechanics, Pain, Decreased mobility, Postural dysfunction, Decreased strength, Decreased endurance, Decreased activity tolerance, Decreased balance, Difficulty walking, Impaired sensation  Visit Diagnosis: Chronic midline low back pain, unspecified whether sciatica present  Other abnormalities of gait and mobility  Muscle weakness (generalized)  Difficulty in walking, not elsewhere classified     Problem List Patient Active Problem List   Diagnosis Date Noted  . Thrombocytopenia (HCC) 05/27/2016  . CHF (congestive heart failure) (HCC) 05/26/2016  . Mesenteric vascular insufficiency (HCC) 02/28/2016  . Pancreas cyst  02/28/2016  . Increased endometrial stripe thickness 02/28/2016  . Carotid atherosclerosis, bilateral 01/23/2016  . Hearing loss 08/17/2015  . Mitral regurgitation 04/19/2015  . Pernicious anemia 09/27/2014  . Paroxysmal A-fib (HCC) 09/27/2014  . Benign essential HTN 09/27/2014  . Perennial allergic rhinitis 09/27/2014  . Chronic anemia 09/27/2014  . Insomnia, persistent 09/27/2014  . Hypertensive pulmonary vascular disease (HCC) 09/27/2014  . B12 deficiency 09/27/2014  . Diverticulosis of colon 09/27/2014  . Dyslipidemia 09/27/2014  . Edema extremities 09/27/2014  . Gastric reflux 09/27/2014  . Bilateral hearing loss 09/27/2014  . Polypharmacy 09/27/2014  . Pelvic pain in female 09/27/2014  . MI (mitral incompetence) 09/27/2014  . Internal hemorrhoids 09/27/2014  . Osteopenia 09/27/2014  . Arthralgia of shoulder 09/27/2014  . Finger tendinitis 09/27/2014  . Urge incontinence 09/27/2014  . Cervical prolapse 09/27/2014  . Vertigo 09/27/2014  . Vitamin D deficiency 09/27/2014  . Bladder cystocele 04/15/2012  . Asymmetric septal hypertrophy (HCC) 05/19/2011  . Hypertrophic cardiomyopathy (HCC) 05/19/2011    Olga Coaster PT, DPT 8:40 AM,06/05/18 [phone]  [facility]  [facility] MAIN [facility] SERVICES [address] [place], [place], [address] Phone: [phone]   Fax:  [phone]  Name: Sharon Bullock MRN: [id number] Date of Birth: 1933-11-30

## 2018-06-08 ENCOUNTER — Ambulatory Visit: Payer: Medicare Other

## 2018-06-10 ENCOUNTER — Ambulatory Visit: Payer: Medicare Other

## 2018-06-11 ENCOUNTER — Inpatient Hospital Stay: Payer: Medicare Other

## 2018-06-11 ENCOUNTER — Inpatient Hospital Stay: Payer: Medicare Other | Attending: Oncology | Admitting: Oncology

## 2018-06-11 ENCOUNTER — Encounter: Payer: Self-pay | Admitting: Oncology

## 2018-06-11 ENCOUNTER — Other Ambulatory Visit: Payer: Self-pay

## 2018-06-11 VITALS — BP 116/72 | HR 71 | Temp 97.8°F | Ht 65.0 in | Wt 168.0 lb

## 2018-06-11 DIAGNOSIS — M545 Low back pain: Secondary | ICD-10-CM | POA: Diagnosis not present

## 2018-06-11 DIAGNOSIS — G8929 Other chronic pain: Secondary | ICD-10-CM | POA: Diagnosis not present

## 2018-06-11 DIAGNOSIS — Z79899 Other long term (current) drug therapy: Secondary | ICD-10-CM | POA: Insufficient documentation

## 2018-06-11 DIAGNOSIS — D696 Thrombocytopenia, unspecified: Secondary | ICD-10-CM | POA: Insufficient documentation

## 2018-06-11 DIAGNOSIS — I10 Essential (primary) hypertension: Secondary | ICD-10-CM | POA: Insufficient documentation

## 2018-06-11 DIAGNOSIS — Z7901 Long term (current) use of anticoagulants: Secondary | ICD-10-CM | POA: Diagnosis not present

## 2018-06-11 DIAGNOSIS — D649 Anemia, unspecified: Secondary | ICD-10-CM | POA: Diagnosis not present

## 2018-06-11 DIAGNOSIS — Z791 Long term (current) use of non-steroidal anti-inflammatories (NSAID): Secondary | ICD-10-CM | POA: Diagnosis not present

## 2018-06-11 LAB — CBC WITH DIFFERENTIAL/PLATELET
Abs Immature Granulocytes: 0.01 10*3/uL (ref 0.00–0.07)
Basophils Absolute: 0 10*3/uL (ref 0.0–0.1)
Basophils Relative: 0 %
Eosinophils Absolute: 0.1 10*3/uL (ref 0.0–0.5)
Eosinophils Relative: 1 %
HCT: 34.2 % — ABNORMAL LOW (ref 36.0–46.0)
Hemoglobin: 10.8 g/dL — ABNORMAL LOW (ref 12.0–15.0)
Immature Granulocytes: 0 %
Lymphocytes Relative: 28 %
Lymphs Abs: 1.3 10*3/uL (ref 0.7–4.0)
MCH: 30.3 pg (ref 26.0–34.0)
MCHC: 31.6 g/dL (ref 30.0–36.0)
MCV: 96.1 fL (ref 80.0–100.0)
Monocytes Absolute: 0.5 10*3/uL (ref 0.1–1.0)
Monocytes Relative: 10 %
Neutro Abs: 2.8 10*3/uL (ref 1.7–7.7)
Neutrophils Relative %: 61 %
Platelets: 122 10*3/uL — ABNORMAL LOW (ref 150–400)
RBC: 3.56 MIL/uL — ABNORMAL LOW (ref 3.87–5.11)
RDW: 13.7 % (ref 11.5–15.5)
WBC: 4.6 10*3/uL (ref 4.0–10.5)
nRBC: 0 % (ref 0.0–0.2)

## 2018-06-14 ENCOUNTER — Ambulatory Visit: Payer: Medicare Other | Attending: Physician Assistant

## 2018-06-14 DIAGNOSIS — R262 Difficulty in walking, not elsewhere classified: Secondary | ICD-10-CM

## 2018-06-14 DIAGNOSIS — R2689 Other abnormalities of gait and mobility: Secondary | ICD-10-CM | POA: Diagnosis not present

## 2018-06-14 DIAGNOSIS — M6281 Muscle weakness (generalized): Secondary | ICD-10-CM | POA: Insufficient documentation

## 2018-06-14 DIAGNOSIS — M545 Low back pain: Secondary | ICD-10-CM | POA: Diagnosis not present

## 2018-06-14 DIAGNOSIS — G8929 Other chronic pain: Secondary | ICD-10-CM | POA: Diagnosis not present

## 2018-06-14 NOTE — Therapy (Addendum)
[place] [facility] MAIN [facility] SERVICES [address] [place], [place], [address] Phone: [phone]   Fax:  [phone]  Physical Therapy Treatment/Re-Certification  Patient Details  Name: Sharon Bullock MRN: [id number] Date of Birth: 1934-01-08 Referring Provider (PT): Faythe Ghee, [place]   Encounter Date: 06/14/2018  PT End of Session - 06/14/18 1219    Visit Number  11    Number of Visits  19    Date for PT Re-Evaluation  07/12/18    Authorization Type  1/10 start 06/03/2018 (eval 04/19/2018)    Authorization - Visit Number  11    Authorization - Number of Visits  17    PT Start Time  0935    PT Stop Time  1014    PT Time Calculation (min)  39 min    Equipment Utilized During Treatment  Gait belt    Activity Tolerance  Patient tolerated treatment well    Behavior During Therapy  WFL for tasks assessed/performed       Past Medical History:  Diagnosis Date  . Allergy   . Enlarged heart 2000  . GERD (gastroesophageal reflux disease)   . Hypertension   . Insomnia   . Vertigo 2006    Past Surgical History:  Procedure Laterality Date  . BLADDER REPAIR  2014   tact    There were no vitals filed for this visit.  Subjective Assessment - 06/14/18 1214    Subjective  The patient reports that she is doing OK today. She notes that her R hip bothered her a little more last week, which she attributes to sleeping on it all night one night. She notes that she missed one appointment last week because she had the wrong time, and another appointment because she had car problems. She has been performing her new HEP consistently with the exception of a few days that she had increased pain or was busy.    Pertinent History  Per MD note: Symptoms started approximately 3 months ago without any inciting event prior to onset. She does recall, however, that she did a lot of cleaning and moving heavy boxes prior to onset but denies any specific injury during  this to cause her pain.    Limitations  Standing;Walking;Lifting;House hold activities;Sitting    How long can you stand comfortably?  >10 min.    How long can you walk comfortably?  >5 min.    Patient Stated Goals  walk better, stairs without any problem    Currently in Pain?  Yes    Pain Score  5     Pain Location  Hip    Pain Orientation  Right    Pain Descriptors / Indicators  Aching;Sore    Pain Type  Chronic pain    Pain Onset  More than a month ago    Pain Frequency  Intermittent      Review and Performance of new HEP: Exercises   Supine Lower Trunk Rotation - 6 reps - 10 hold - 1x daily - 7x weekly; patient benefits from verbal cue to increase range of performance with increasing repetitions  Supine Posterior Pelvic Tilt - 20 reps - 1x daily - 7x weekly; patient requires verbal cue to slow, patient reports slight concordant R hip soreness  Sit to Stand with Armchair - 5 reps - 1 sets - 1x daily - 7x weekly; patient benefits from verbal cues to stand all the way up and to control descent with last repetitions  Seated Hamstring Stretch - 3 reps - 1 sets - 60 hold - 1x daily - 7x weekly   Supine Single Knee to Chest - 3 reps - 1 sets - 30 hold - 1x daily - 7x weekly; patient benefits from verbal cue to keep opposite leg bent to prevent hip/back pain/discomfort   Seated March - 10 reps - 1 sets - 1x daily - 7x weekly   -Standing hip flexor stretch with BUE support for balance in parallel bars x60 seconds each side  -Standing hip abductor/TFL stretch with BUE support for balance in parallel bar 1x60 each side; patient requires assistance/cueing to modify degree of stretch to appropriate level   -Seated hamstring stretch with patient performing dorsiflexion/plantarflexion for nerve glides/floss x45 seconds each LE  -Supine long arc hip distraction: 2x30 second hold BLE for pain relief   -Supine short arc hip distraction: 2x30 second hold BLE for pain relief  **Patient  reports soreness and is tender to palpation along R hip abductors and hip flexors **At end of session, patient reports some pain relief and notes her R hip pain decreased to 4/10                          PT Education - 06/14/18 1215    Education Details  exercies technique, HEP, sit to stand technique, stretching    Person(s) Educated  Patient    Methods  Explanation;Demonstration;Tactile cues;Verbal cues    Comprehension  Verbalized understanding;Returned demonstration;Verbal cues required;Need further instruction;Tactile cues required       PT Short Term Goals - 06/14/18 1247      PT SHORT TERM GOAL #1   Title  Patient will be independent with HEP in order to maintain/improve gains made in physical therapy.    Baseline  Provided ; updated 06/03/2018    Time  4    Period  Weeks    Status  New    Target Date  07/01/18      PT SHORT TERM GOAL #2   Title  Patient will have a NPRS rating of 6/10 or less to represent a clinically significant decrease in pain for improved function at home and in the community.    Baseline  10/10 ; 6/10 in the last 3 weeks    Time  4    Period  Weeks    Status  Achieved        PT Long Term Goals - 06/14/18 1248      PT LONG TERM GOAL #1   Title  Pt will decrease 5TSTS by at least 3 seconds in order to demonstrate clinically significant improvement in LE strength.    Baseline  30.6 sec with BUE support; 25.6 sec with UE support on knees    Time  4    Period  Weeks    Status  On-going    Target Date  07/12/18      PT LONG TERM GOAL #2   Title  Pt will decrease mODI scoreby at least 13 points in order demonstrate clinically significant reduction in back pain/disability.     Baseline  60%; 46% 2/20     Time  4    Period  Weeks    Status  New    Target Date  07/12/18      PT LONG TERM GOAL #3   Title  Patient will be able to walk with LRAD for > 5 minutes, symmetrical stride length, and  improved foot clearance during  swings without increased genu valgum in order to return to her PLOF and decrease risk of falls.    Baseline  Household distances <5 min, SPC, shuffling gait with increased genu valgum;    Time  4    Period  Weeks    Status  On-going    Target Date  07/12/18            Plan - 06/14/18 1237    Clinical Impression Statement  Goals re-assessed last session on 06/03/2018, and patient is demonstrating progress towards goals and continues to improve from a functional standpoint. Please consult note from 06/03/2018 for additonal information. This session, the patient continues to report decreased R hip pain at the end of her sessions following stretching and strengthening exercise in addition to general physical activity. However, the patient's R hip pain should continue to be monitored moving forward for changes and to ensure improvement/progress. Today's session was focused on decreasing the patient's R hip pain and reviewing patient's new HEP to ensure correct form and understanding of exercises. The patient will continue to benefit from skilled PT in order to work towards goals, to decrease pain, and to improve strength and activity tolerance to maximize safety and independence with functional mobility.     Rehab Potential  Good    PT Frequency  2x / week    PT Duration  8 weeks    PT Treatment/Interventions  Aquatic Therapy;Cryotherapy;Electrical Stimulation;Moist Heat;Stair training;Neuromuscular re-education;Gait training;Balance training;Therapeutic exercise;Therapeutic activities;Patient/family education;Manual techniques;Taping;Dry needling;Passive range of motion;Vestibular    PT Next Visit Plan  practice and progress sit to stands; progress BLE strength    PT Home Exercise Plan  HEP updated 06/03/2018    Consulted and Agree with Plan of Care  Patient       Patient will benefit from skilled therapeutic intervention in order to improve the following deficits and impairments:  Abnormal gait,  Improper body mechanics, Pain, Decreased mobility, Postural dysfunction, Decreased strength, Decreased endurance, Decreased activity tolerance, Decreased balance, Difficulty walking, Impaired sensation  Visit Diagnosis: Chronic midline low back pain, unspecified whether sciatica present  Other abnormalities of gait and mobility  Muscle weakness (generalized)  Difficulty in walking, not elsewhere classified     Problem List Patient Active Problem List   Diagnosis Date Noted  . Thrombocytopenia (HCC) 05/27/2016  . CHF (congestive heart failure) (HCC) 05/26/2016  . Mesenteric vascular insufficiency (HCC) 02/28/2016  . Pancreas cyst 02/28/2016  . Increased endometrial stripe thickness 02/28/2016  . Carotid atherosclerosis, bilateral 01/23/2016  . Hearing loss 08/17/2015  . Mitral regurgitation 04/19/2015  . Pernicious anemia 09/27/2014  . Paroxysmal A-fib (HCC) 09/27/2014  . Benign essential HTN 09/27/2014  . Perennial allergic rhinitis 09/27/2014  . Chronic anemia 09/27/2014  . Insomnia, persistent 09/27/2014  . Hypertensive pulmonary vascular disease (HCC) 09/27/2014  . B12 deficiency 09/27/2014  . Diverticulosis of colon 09/27/2014  . Dyslipidemia 09/27/2014  . Edema extremities 09/27/2014  . Gastric reflux 09/27/2014  . Bilateral hearing loss 09/27/2014  . Polypharmacy 09/27/2014  . Pelvic pain in female 09/27/2014  . MI (mitral incompetence) 09/27/2014  . Internal hemorrhoids 09/27/2014  . Osteopenia 09/27/2014  . Arthralgia of shoulder 09/27/2014  . Finger tendinitis 09/27/2014  . Urge incontinence 09/27/2014  . Cervical prolapse 09/27/2014  . Vertigo 09/27/2014  . Vitamin D deficiency 09/27/2014  . Bladder cystocele 04/15/2012  . Asymmetric septal hypertrophy (HCC) 05/19/2011  . Hypertrophic cardiomyopathy (HCC) 05/19/2011   Sandra Cockayne, SPT  This entire  session was performed under direct supervision and direction of a licensed Veterinary surgeon . I have personally read, edited and approve of the note as written.  Precious Bard, PT, DPT   06/14/2018, 1:13 PM  [place] [facility] MAIN [facility] SERVICES [address] [place], [place], [address] Phone: [phone]   Fax:  [phone]  Name: Sharon Bullock MRN: [id number] Date of Birth: 01-Mar-1934

## 2018-06-15 NOTE — Progress Notes (Signed)
Hematology/Oncology Consult note [facility]  [phone] [phone]  Patient Care Team: Steele Sizer, MD as PCP - General (Family Medicine) Derinda Sis, MD as Consulting Physician (Internal Medicine) Cathi Roan, [facility] as Pharmacist (Pharmacist) Benedetto Goad, RN as Case Manager   Name of the patient: Sharon Bullock  [id number]  1933/11/13   Date of visit: 06/15/18  Diagnosis- 1. Anemia possibly anemia of chronic disease versus MDS 2. Mild thrombocytopenia- ITP versus MDS  Chief complaint/ Reason for visit-routine follow-up of anemia and thrombocytopenia  Heme/Onc history: patient is a 85 year old female with a past medical history significant for hypertension, allergic rhinitis B12 deficiency as well as hypertrophic cardiomyopathy and pulmonary vascular disease. Patient had a recent CBC which showed white count of 4.3, H&H of 10.7/33.5 and a platelet count of 139. She has been sent to [place] for evaluation and management of thrombocytopenia. Prior CBC from May 2017 showed white count of 5, H&H of 11.4/74.9 and a platelet count of 177. Looking at her prior counts from 2007 2017, a she has had normal platelet count of 04/15/2012. Between October and November 2017 patient's platelet count was 128, 116, 141 respectively. Her H&H during this time has been trending between 10.3-11.  Results of blood work from 06/19/2016 were as follows: CBC showed white count of 4.7, H&H of 11.1/33.4 and a platelet count of 128. Review of peripheral smear showed normocytic anemia and thrombocytopenia but no other significant findings. CMP was within normal limits. Ferritin was 58 and iron studies were within normal limits. Folate was within normal limits. TSH was normal. Reticulocyte count was normal at 1.1%. Haptoglobin was less than 10. Hep C antibody was negative. Multiple myeloma panel did not reveal any monoclonal protein. H pylori stool antigen was  negative.  Interval history-her health is remained stable overall in the last 1 year.  Denies any hospitalizations or recurrent infections.  She has chronic low back pain for which she is undergoing physical therapy.  Also reports problems with insomnia for which she is on trazodone.  ECOG PS- 1 Pain scale- 0   Review of systems- Review of Systems  Constitutional: Positive for malaise/fatigue. Negative for chills, fever and weight loss.  HENT: Negative for congestion, ear discharge and nosebleeds.   Eyes: Negative for blurred vision.  Respiratory: Negative for cough, hemoptysis, sputum production, shortness of breath and wheezing.   Cardiovascular: Negative for chest pain, palpitations, orthopnea and claudication.  Gastrointestinal: Negative for abdominal pain, blood in stool, constipation, diarrhea, heartburn, melena, nausea and vomiting.  Genitourinary: Negative for dysuria, flank pain, frequency, hematuria and urgency.  Musculoskeletal: Positive for joint pain. Negative for back pain and myalgias.  Skin: Negative for rash.  Neurological: Negative for dizziness, tingling, focal weakness, seizures, weakness and headaches.  Endo/Heme/Allergies: Does not bruise/bleed easily.  Psychiatric/Behavioral: Negative for depression and suicidal ideas. The patient does not have insomnia.        Allergies  Allergen Reactions  . Nickel Dermatitis and Swelling  . Other Anaphylaxis    Uncoded Allergy. Allergen: SHELLFISH  . Ace Inhibitors Cough     Past Medical History:  Diagnosis Date  . Allergy   . Enlarged heart 2000  . GERD (gastroesophageal reflux disease)   . Hypertension   . Insomnia   . Vertigo 2006     Past Surgical History:  Procedure Laterality Date  . BLADDER REPAIR  2014   tact    Social History   Socioeconomic History  .  Marital status: Widowed    Spouse name: Not on file  . Number of children: 5  . Years of education: some college  . Highest education level:  12th grade  Occupational History  . Occupation: Retired  Scientific laboratory technician  . Financial resource strain: Not hard at all  . Food insecurity:    Worry: Never true    Inability: Never true  . Transportation needs:    Medical: No    Non-medical: No  Tobacco Use  . Smoking status: Never Smoker  . Smokeless tobacco: Never Used  . Tobacco comment: smoking cessation materials not required  Substance and Sexual Activity  . Alcohol use: No    Alcohol/week: 0.0 standard drinks  . Drug use: No  . Sexual activity: Not Currently  Lifestyle  . Physical activity:    Days per week: 0 days    Minutes per session: 0 min  . Stress: To some extent  Relationships  . Social connections:    Talks on phone: Patient refused    Gets together: Patient refused    Attends religious service: Patient refused    Active member of club or organization: Patient refused    Attends meetings of clubs or organizations: Patient refused    Relationship status: Widowed  . Intimate partner violence:    Fear of current or ex partner: No    Emotionally abused: No    Physically abused: No    Forced sexual activity: No  Other Topics Concern  . Not on file  Social History Narrative   Lost husband 07/18/2017   Grown son still living with her     Family History  Problem Relation Age of Onset  . Alzheimer's disease Mother   . Hypertension Mother   . Stroke Father   . Heart disease Father   . Heart disease Brother   . Hypertension Brother   . Cancer Daughter        breast  . Hypertension Daughter   . Breast cancer Daughter   . Diabetes Brother   . Heart disease Brother   . Heart disease Brother   . Diabetes Brother   . Hypertension Brother   . Heart disease Brother   . Hypertension Brother   . Cancer Brother        prostate  . Diabetes Brother   . Heart disease Brother   . Hypertension Brother   . Cancer Brother        prostate     Current Outpatient Medications:  .  amiodarone (PACERONE) 200 MG  tablet, Take 1 tablet by mouth daily., Disp: , Rfl:  .  amLODipine (NORVASC) 10 MG tablet, TAKE 1 TABLET(10 MG) BY MOUTH DAILY, Disp: 90 tablet, Rfl: 1 .  azelastine (ASTELIN) 0.1 % nasal spray, Place 2 sprays into both nostrils 2 (two) times daily. Use in each nostril as directed, Disp: 30 mL, Rfl: 12 .  calcium-vitamin D (OSCAL WITH D) 500-200 MG-UNIT tablet, Take 1 tablet by mouth 2 (two) times daily., Disp: , Rfl:  .  carvedilol (COREG) 25 MG tablet, TAKE 1 TABLET(25 MG) BY MOUTH TWICE DAILY WITH A MEAL, Disp: 180 tablet, Rfl: 1 .  COENZYME Q10 PO, Take 30 mg by mouth daily., Disp: , Rfl:  .  diclofenac sodium (VOLTAREN) 1 % GEL, APPLY TO WRIST TWICE DAILY, Disp: 100 g, Rfl: 0 .  ferrous gluconate (FERGON) 324 MG tablet, Take 324 mg by mouth daily with breakfast., Disp: , Rfl:  .  fluticasone (  FLONASE) 50 MCG/ACT nasal spray, Place 2 sprays into both nostrils daily., Disp: 16 g, Rfl: 6 .  folic acid (FOLVITE) 665 MCG tablet, Take 1 tablet by mouth daily. Reported on 04/19/2015, Disp: , Rfl:  .  irbesartan (AVAPRO) 300 MG tablet, TAKE 1 TABLET BY MOUTH EVERY DAY FOR BLOOD PRESSURE, Disp: 90 tablet, Rfl: 1 .  omeprazole (PRILOSEC) 40 MG capsule, TAKE 1 CAPSULE(40 MG) BY MOUTH DAILY, Disp: 90 capsule, Rfl: 1 .  PRADAXA 150 MG CAPS capsule, Take 1 capsule (150 mg total) by mouth 2 (two) times daily., Disp: 180 capsule, Rfl: 1 .  rosuvastatin (CRESTOR) 10 MG tablet, TAKE 1 TABLET BY MOUTH DAILY WITH CO-Q10, Disp: 90 tablet, Rfl: 1 .  Vitamin D, Cholecalciferol, 1000 UNITS TABS, Take 1 tablet by mouth daily., Disp: , Rfl:  .  zolpidem (AMBIEN) 5 MG tablet, Take 1 tablet (5 mg total) by mouth at bedtime as needed for sleep., Disp: 90 tablet, Rfl: 0  Current Facility-Administered Medications:  .  cyanocobalamin ((VITAMIN B-12)) injection 1,000 mcg, 1,000 mcg, Intramuscular, Q30 days, Steele Sizer, MD, 1,000 mcg at 04/23/18 1020  Physical exam:  Vitals:   06/11/18 1001  BP: 116/72  Pulse: 71   Temp: 97.8 F (36.6 C)  TempSrc: Tympanic  Weight: 168 lb (76.2 kg)  Height: '5\' 5"'  (1.651 m)   Physical Exam Constitutional:      General: She is not in acute distress.    Comments: Sitting in a wheelchair  HENT:     Head: Normocephalic and atraumatic.  Eyes:     Pupils: Pupils are equal, round, and reactive to light.  Neck:     Musculoskeletal: Normal range of motion.  Cardiovascular:     Rate and Rhythm: Normal rate and regular rhythm.     Heart sounds: Murmur present.  Pulmonary:     Effort: Pulmonary effort is normal.     Breath sounds: Normal breath sounds.  Abdominal:     General: Bowel sounds are normal.     Palpations: Abdomen is soft.  Skin:    General: Skin is warm and dry.  Neurological:     Mental Status: She is alert and oriented to person, place, and time.      CMP Latest Ref Rng & Units 01/12/2018  Glucose 65 - 139 mg/dL 102  BUN 7 - 25 mg/dL 11  Creatinine 0.60 - 0.88 mg/dL 0.93(H)  Sodium 135 - 146 mmol/L 139  Potassium 3.5 - 5.3 mmol/L 3.8  Chloride 98 - 110 mmol/L 106  CO2 20 - 32 mmol/L 24  Calcium 8.6 - 10.4 mg/dL 9.1  Total Protein 6.1 - 8.1 g/dL 6.8  Total Bilirubin 0.2 - 1.2 mg/dL 0.3  Alkaline Phos 38 - 126 U/L -  AST 10 - 35 U/L 20  ALT 6 - 29 U/L 13   CBC Latest Ref Rng & Units 06/11/2018  WBC 4.0 - 10.5 K/uL 4.6  Hemoglobin 12.0 - 15.0 g/dL 10.8(L)  Hematocrit 36.0 - 46.0 % 34.2(L)  Platelets 150 - 400 K/uL 122(L)      Assessment and plan- Patient is a 85 y.o. female here for routine follow-up of anemia and thrombocytopenia  1.  Normocytic anemia: Her hemoglobin is remained stable between 10-11 for the last 2 years.  Also she has mild intermittent thrombocytopenia with a platelet count between 120s to 140s which has remained stable.  He has a history of pernicious anemia for which she is on monthly B12 shots.  I  will see her back in 1 years time with a CBC with differential.  Interim CBC with differential in 6 months time    Visit Diagnosis 1. Normocytic anemia   2. Thrombocytopenia ([place])      Dr. Randa Evens, MD, MPH [facility] [phone] 06/15/2018 9:29 AM

## 2018-06-17 ENCOUNTER — Ambulatory Visit: Payer: Medicare Other

## 2018-06-17 DIAGNOSIS — R262 Difficulty in walking, not elsewhere classified: Secondary | ICD-10-CM | POA: Diagnosis not present

## 2018-06-17 DIAGNOSIS — G8929 Other chronic pain: Secondary | ICD-10-CM

## 2018-06-17 DIAGNOSIS — R2689 Other abnormalities of gait and mobility: Secondary | ICD-10-CM

## 2018-06-17 DIAGNOSIS — M6281 Muscle weakness (generalized): Secondary | ICD-10-CM

## 2018-06-17 DIAGNOSIS — M545 Low back pain, unspecified: Secondary | ICD-10-CM

## 2018-06-17 NOTE — Therapy (Signed)
[facility] MAIN [facility] SERVICES [address] [place], [place], [address] Phone: [phone]   Fax:  [phone]  Physical Therapy Treatment  Patient Details  Name: Sharon Bullock MRN: [id number] Date of Birth: 1933-08-02 Referring Provider (PT): Faythe Ghee, [place]   Encounter Date: 06/17/2018  PT End of Session - 06/17/18 0944    Visit Number  12    Number of Visits  19    Date for PT Re-Evaluation  07/12/18    Authorization Type  1/10 start 06/03/2018 (eval 04/19/2018)    Authorization - Visit Number  12    Authorization - Number of Visits  17    PT Start Time  0931    PT Stop Time  1011    PT Time Calculation (min)  40 min    Activity Tolerance  Patient tolerated treatment well    Behavior During Therapy  [facility] for tasks assessed/performed       Past Medical History:  Diagnosis Date  . Allergy   . Enlarged heart 2000  . GERD (gastroesophageal reflux disease)   . Hypertension   . Insomnia   . Vertigo 2006    Past Surgical History:  Procedure Laterality Date  . BLADDER REPAIR  2014   tact    There were no vitals filed for this visit.  Subjective Assessment - 06/17/18 0936    Subjective  Pt reports doing ok today. HEP is being performed as directed without any confusion or hurdles. Pt says ip is feelign painful, but in general less painful than in the past.     Pertinent History  Per MD note: Symptoms started approximately 3 months ago without any inciting event prior to onset. She does recall, however, that she did a lot of cleaning and moving heavy boxes prior to onset but denies any specific injury during this to cause her pain.    Currently in Pain?  Yes    Pain Score  6     Pain Location  --   Right hip; posterolateral gluteal region neal posterior iiliac crest        INTERVENTION THIS DATE:   Therapeutic Exercise:  -STS from chair + airex c BUE support (3x10) (movement remain segmented and poorly controlled due  top weakness.  -chair+airex seated marching with VC for core stabilization 2x20 alternating pattern  -seated LAQ 3x10 3lb ankle weights (extensive verbal cues for instruction each time) 5lbs attempted but too heavy  -Standing heel raises : 2x10  -Seated Deadlit (Double floor touch) pt at edge of chair, feet/knees wide: 1x15 -FWD step up onto airex, BUE support, VC for foot clearance 1x10 bilat  -Lateral side stepping c SPC in // bars 1x58ft bilat (pain in hip c Left side stepping)    PT Short Term Goals - 06/14/18 1247      PT SHORT TERM GOAL #1   Title  Patient will be independent with HEP in order to maintain/improve gains made in physical therapy.    Baseline  Provided ; updated 06/03/2018    Time  4    Period  Weeks    Status  New    Target Date  07/01/18      PT SHORT TERM GOAL #2   Title  Patient will have a NPRS rating of 6/10 or less to represent a clinically significant decrease in pain for improved function at home and in the community.    Baseline  10/10 ; 6/10 in the last 3  weeks    Time  4    Period  Weeks    Status  Achieved        PT Long Term Goals - 06/14/18 1248      PT LONG TERM GOAL #1   Title  Pt will decrease 5TSTS by at least 3 seconds in order to demonstrate clinically significant improvement in LE strength.    Baseline  30.6 sec with BUE support; 25.6 sec with UE support on knees    Time  4    Period  Weeks    Status  On-going    Target Date  07/12/18      PT LONG TERM GOAL #2   Title  Pt will decrease mODI scoreby at least 13 points in order demonstrate clinically significant reduction in back pain/disability.     Baseline  60%; 46% 2/20     Time  4    Period  Weeks    Status  New    Target Date  07/12/18      PT LONG TERM GOAL #3   Title  Patient will be able to walk with LRAD for > 5 minutes, symmetrical stride length, and improved foot clearance during swings without increased genu valgum in order to return to her PLOF and decrease risk of  falls.    Baseline  Household distances <5 min, SPC, shuffling gait with increased genu valgum;    Time  4    Period  Weeks    Status  On-going    Target Date  07/12/18            Plan - 06/17/18 0954    Clinical Impression Statement  Continued with generalized strengthening to address deficits identified on examination. Pt participates in exercises, mostly seated, for strengthening ankles, knees, and hips. Pt requires cues to avoid explosive uncontrolled movements and for performance of slow controlled movement with pauses at end range. Pt able to perform entire session without additional rest breaks. Pt reports improved pain at end of session.     Rehab Potential  Good    PT Frequency  2x / week    PT Duration  8 weeks    PT Treatment/Interventions  Aquatic Therapy;Cryotherapy;Electrical Stimulation;Moist Heat;Stair training;Neuromuscular re-education;Gait training;Balance training;Therapeutic exercise;Therapeutic activities;Patient/family education;Manual techniques;Taping;Dry needling;Passive range of motion;Vestibular    PT Next Visit Plan  practice and progress sit to stands; progress BLE strength    PT Home Exercise Plan  HEP updated 06/03/2018    Consulted and Agree with Plan of Care  Patient       Patient will benefit from skilled therapeutic intervention in order to improve the following deficits and impairments:  Abnormal gait, Improper body mechanics, Pain, Decreased mobility, Postural dysfunction, Decreased strength, Decreased endurance, Decreased activity tolerance, Decreased balance, Difficulty walking, Impaired sensation  Visit Diagnosis: Chronic midline low back pain, unspecified whether sciatica present  Other abnormalities of gait and mobility  Muscle weakness (generalized)  Difficulty in walking, not elsewhere classified     Problem List Patient Active Problem List   Diagnosis Date Noted  . Thrombocytopenia (HCC) 05/27/2016  . CHF (congestive heart  failure) (HCC) 05/26/2016  . Mesenteric vascular insufficiency (HCC) 02/28/2016  . Pancreas cyst 02/28/2016  . Increased endometrial stripe thickness 02/28/2016  . Carotid atherosclerosis, bilateral 01/23/2016  . Hearing loss 08/17/2015  . Mitral regurgitation 04/19/2015  . Pernicious anemia 09/27/2014  . Paroxysmal A-fib (HCC) 09/27/2014  . Benign essential HTN 09/27/2014  . Perennial allergic rhinitis 09/27/2014  .  Chronic anemia 09/27/2014  . Insomnia, persistent 09/27/2014  . Hypertensive pulmonary vascular disease (HCC) 09/27/2014  . B12 deficiency 09/27/2014  . Diverticulosis of colon 09/27/2014  . Dyslipidemia 09/27/2014  . Edema extremities 09/27/2014  . Gastric reflux 09/27/2014  . Bilateral hearing loss 09/27/2014  . Polypharmacy 09/27/2014  . Pelvic pain in female 09/27/2014  . MI (mitral incompetence) 09/27/2014  . Internal hemorrhoids 09/27/2014  . Osteopenia 09/27/2014  . Arthralgia of shoulder 09/27/2014  . Finger tendinitis 09/27/2014  . Urge incontinence 09/27/2014  . Cervical prolapse 09/27/2014  . Vertigo 09/27/2014  . Vitamin D deficiency 09/27/2014  . Bladder cystocele 04/15/2012  . Asymmetric septal hypertrophy (HCC) 05/19/2011  . Hypertrophic cardiomyopathy (HCC) 05/19/2011    10:05 AM, 06/17/18 Rosamaria Lints, PT, DPT Physical Therapist - [facility]  Outpatient Physical Therapy- Main Campus [phone]     Rosamaria Lints 06/17/2018, 9:55 AM  [facility] MAIN [facility] SERVICES [address] [place], [place], [address] Phone: [phone]   Fax:  [phone]  Name: Sherrye Colegrove MRN: [id number] Date of Birth: 01-20-1934

## 2018-06-21 ENCOUNTER — Ambulatory Visit: Payer: Medicare Other

## 2018-06-21 DIAGNOSIS — M545 Low back pain, unspecified: Secondary | ICD-10-CM

## 2018-06-21 DIAGNOSIS — R2689 Other abnormalities of gait and mobility: Secondary | ICD-10-CM

## 2018-06-21 DIAGNOSIS — R262 Difficulty in walking, not elsewhere classified: Secondary | ICD-10-CM

## 2018-06-21 DIAGNOSIS — G8929 Other chronic pain: Secondary | ICD-10-CM | POA: Diagnosis not present

## 2018-06-21 DIAGNOSIS — M6281 Muscle weakness (generalized): Secondary | ICD-10-CM | POA: Diagnosis not present

## 2018-06-21 NOTE — Therapy (Signed)
[facility] MAIN [facility] SERVICES [address] [place], [place], [address] Phone: [phone]   Fax:  [phone]  Physical Therapy Treatment  Patient Details  Name: Sharon Bullock MRN: [id number] Date of Birth: [birth year] Referring Provider (PT): Faythe Ghee, [place]   Encounter Date: 06/21/2018  PT End of Session - 06/21/18 1358    Visit Number  13    Number of Visits  19    Date for PT Re-Evaluation  07/12/18    Authorization Type  2/10 start 06/03/2018 (eval 04/19/2018)    Authorization - Visit Number  13    Authorization - Number of Visits  17    PT Start Time  1353    PT Stop Time  1434    PT Time Calculation (min)  41 min    Equipment Utilized During Treatment  Gait belt    Activity Tolerance  Patient tolerated treatment well    Behavior During Therapy  [facility] for tasks assessed/performed       Past Medical History:  Diagnosis Date  . Allergy   . Enlarged heart 2000  . GERD (gastroesophageal reflux disease)   . Hypertension   . Insomnia   . Vertigo 2006    Past Surgical History:  Procedure Laterality Date  . BLADDER REPAIR  2014   tact    There were no vitals filed for this visit.  Subjective Assessment - 06/21/18 1357    Subjective  Patient reported that she has her usual soreness today, stated that she is doing her exercises at home.    Pertinent History  Per MD note: Symptoms started approximately 3 months ago without any inciting event prior to onset. She does recall, however, that she did a lot of cleaning and moving heavy boxes prior to onset but denies any specific injury during this to cause her pain.    Limitations  Standing;Walking;Lifting;House hold activities;Sitting    How long can you stand comfortably?  >10 min.    How long can you walk comfortably?  >5 min.    Patient Stated Goals  walk better, stairs without any problem    Currently in Pain?  Yes    Pain Score  4     Pain Location  Hip    Pain  Orientation  Right    Pain Descriptors / Indicators  Sore      TREATMENT:  Therapeutic Exercise:  nustep for warmup, cardiovascular challenge, pt response monitored throughout, mild SOB noted x4 mins level 1 -STS from elevated plinth without BUE support x10 -chair seated marching with ankle weights 3#with VC for core stabilization 2x15 alternating pattern  -seated LAQ 3x10 3lb ankle weights (extensive verbal cues for instruction each time)  -Standing heel raises : 2x10 in // bars for safety -Seated Deadlit (Double floor touch) pt at edge of chair, feet/knees wide: 1x15 -FWD step up onto airex, BUE support, VC for foot clearance 1x10 bilat  -standing hip abduction bilaterally with // support 2x10  -stepping through agility ladder one foot per hole in // bars for safety, pt able to progress to no UE support; CGA throughout    PT Education - 06/21/18 1358    Education Details  exercise technique, HEP     Person(s) Educated  Patient    Methods  Explanation;Demonstration;Tactile cues;Verbal cues    Comprehension  Verbalized understanding       PT Short Term Goals - 06/14/18 1247      PT SHORT TERM GOAL #1  Title  Patient will be independent with HEP in order to maintain/improve gains made in physical therapy.    Baseline  Provided ; updated 06/03/2018    Time  4    Period  Weeks    Status  New    Target Date  07/01/18      PT SHORT TERM GOAL #2   Title  Patient will have a NPRS rating of 6/10 or less to represent a clinically significant decrease in pain for improved function at home and in the community.    Baseline  10/10 ; 6/10 in the last 3 weeks    Time  4    Period  Weeks    Status  Achieved        PT Long Term Goals - 06/14/18 1248      PT LONG TERM GOAL #1   Title  Pt will decrease 5TSTS by at least 3 seconds in order to demonstrate clinically significant improvement in LE strength.    Baseline  30.6 sec with BUE support; 25.6 sec with UE support on knees     Time  4    Period  Weeks    Status  On-going    Target Date  07/12/18      PT LONG TERM GOAL #2   Title  Pt will decrease mODI scoreby at least 13 points in order demonstrate clinically significant reduction in back pain/disability.     Baseline  60%; 46% 2/20     Time  4    Period  Weeks    Status  New    Target Date  07/12/18      PT LONG TERM GOAL #3   Title  Patient will be able to walk with LRAD for > 5 minutes, symmetrical stride length, and improved foot clearance during swings without increased genu valgum in order to return to her PLOF and decrease risk of falls.    Baseline  Household distances <5 min, SPC, shuffling gait with increased genu valgum;    Time  4    Period  Weeks    Status  On-going    Target Date  07/12/18            Plan - 06/21/18 1534    Clinical Impression Statement  Session focused on generalized strengthening, pt needed continuous verbal/visual cues for proper exercise technique/form. Pt challenged with agility ladder focusing on step length, able to perform without UE support, CGA, with fair carry over to ambulation with increased gait velocity and step length noted. The patient would benefit from further skilled PT to continue to progress towards goals. Patient also reported pain decreased from 4/10 to 2/10 at end of session.     Rehab Potential  Good    PT Frequency  2x / week    PT Duration  8 weeks    PT Treatment/Interventions  Aquatic Therapy;Cryotherapy;Electrical Stimulation;Moist Heat;Stair training;Neuromuscular re-education;Gait training;Balance training;Therapeutic exercise;Therapeutic activities;Patient/family education;Manual techniques;Taping;Dry needling;Passive range of motion;Vestibular    PT Next Visit Plan  practice and progress sit to stands; progress BLE strength    PT Home Exercise Plan  HEP updated 06/03/2018    Consulted and Agree with Plan of Care  Patient       Patient will benefit from skilled therapeutic intervention  in order to improve the following deficits and impairments:  Abnormal gait, Improper body mechanics, Pain, Decreased mobility, Postural dysfunction, Decreased strength, Decreased endurance, Decreased activity tolerance, Decreased balance, Difficulty walking, Impaired sensation  Visit Diagnosis: Chronic midline low back pain, unspecified whether sciatica present  Other abnormalities of gait and mobility  Muscle weakness (generalized)  Difficulty in walking, not elsewhere classified     Problem List Patient Active Problem List   Diagnosis Date Noted  . Thrombocytopenia (HCC) 05/27/2016  . CHF (congestive heart failure) (HCC) 05/26/2016  . Mesenteric vascular insufficiency (HCC) 02/28/2016  . Pancreas cyst 02/28/2016  . Increased endometrial stripe thickness 02/28/2016  . Carotid atherosclerosis, bilateral 01/23/2016  . Hearing loss 08/17/2015  . Mitral regurgitation 04/19/2015  . Pernicious anemia 09/27/2014  . Paroxysmal A-fib (HCC) 09/27/2014  . Benign essential HTN 09/27/2014  . Perennial allergic rhinitis 09/27/2014  . Chronic anemia 09/27/2014  . Insomnia, persistent 09/27/2014  . Hypertensive pulmonary vascular disease (HCC) 09/27/2014  . B12 deficiency 09/27/2014  . Diverticulosis of colon 09/27/2014  . Dyslipidemia 09/27/2014  . Edema extremities 09/27/2014  . Gastric reflux 09/27/2014  . Bilateral hearing loss 09/27/2014  . Polypharmacy 09/27/2014  . Pelvic pain in female 09/27/2014  . MI (mitral incompetence) 09/27/2014  . Internal hemorrhoids 09/27/2014  . Osteopenia 09/27/2014  . Arthralgia of shoulder 09/27/2014  . Finger tendinitis 09/27/2014  . Urge incontinence 09/27/2014  . Cervical prolapse 09/27/2014  . Vertigo 09/27/2014  . Vitamin D deficiency 09/27/2014  . Bladder cystocele 04/15/2012  . Asymmetric septal hypertrophy (HCC) 05/19/2011  . Hypertrophic cardiomyopathy (HCC) 05/19/2011   Olga Coaster PT, DPT 3:38 PM,06/21/18 [phone]  [facility]  [facility] MAIN [facility] SERVICES [address] [place], [place], [address] Phone: [phone]   Fax:  [phone]  Name: Sharon Bullock MRN: [id number] Date of Birth: 1934-04-14

## 2018-06-25 ENCOUNTER — Ambulatory Visit: Payer: Medicare Other

## 2018-06-25 ENCOUNTER — Other Ambulatory Visit: Payer: Self-pay

## 2018-06-25 DIAGNOSIS — M545 Low back pain, unspecified: Secondary | ICD-10-CM

## 2018-06-25 DIAGNOSIS — R262 Difficulty in walking, not elsewhere classified: Secondary | ICD-10-CM

## 2018-06-25 DIAGNOSIS — G8929 Other chronic pain: Secondary | ICD-10-CM

## 2018-06-25 DIAGNOSIS — R2689 Other abnormalities of gait and mobility: Secondary | ICD-10-CM

## 2018-06-25 DIAGNOSIS — M6281 Muscle weakness (generalized): Secondary | ICD-10-CM | POA: Diagnosis not present

## 2018-06-25 NOTE — Therapy (Signed)
[facility] [facility] MAIN [facility] SERVICES [address] [place], [place], [address] Phone: [phone]   Fax:  [phone]  Physical Therapy Treatment  Patient Details  Name: Sharon Bullock MRN: [id number] Date of Birth: 1933-08-13 Referring Provider (PT): Faythe Ghee, [place]   Encounter Date: 06/25/2018  PT End of Session - 06/25/18 1125    Visit Number  14    Number of Visits  19    Date for PT Re-Evaluation  07/12/18    Authorization Type  2/10 start 06/03/2018 (eval 04/19/2018)    Authorization - Visit Number  14    Authorization - Number of Visits  17    PT Start Time  1111    PT Stop Time  1151    PT Time Calculation (min)  40 min    Activity Tolerance  Patient tolerated treatment well;Patient limited by pain    Behavior During Therapy  [facility] for tasks assessed/performed       Past Medical History:  Diagnosis Date  . Allergy   . Enlarged heart 2000  . GERD (gastroesophageal reflux disease)   . Hypertension   . Insomnia   . Vertigo 2006    Past Surgical History:  Procedure Laterality Date  . BLADDER REPAIR  2014   tact    There were no vitals filed for this visit.  Subjective Assessment - 06/25/18 1123    Subjective  Pt doing well today. Reports she is a little sore from her PT exercises still.     Pertinent History  Per MD note: Symptoms started approximately 3 months ago without any inciting event prior to onset. She does recall, however, that she did a lot of cleaning and moving heavy boxes prior to onset but denies any specific injury during this to cause her pain.    Patient Stated Goals  walk better, stairs without any problem    Currently in Pain?  Yes    Pain Score  3     Pain Location  --   Bilat anterior groin/hip Rt > Lt        INTERVENTION THIS DATE:    Therapeutic Exercise: -STS from elevated plinth without BUE support 3x10 (hands across belly)  -chair+airex seated marching with VC for core stabilization 2x20  alternating pattern  -Standing heel raises : 2x15 (leaning on chair, VC for maximal vertical excursion)  -Seated Deadlit (Double floor touch) pt at edge of chair, feet/knees wide: 2x15  Neuromuscular Reeducation:  -Lateral Side stepping 2x15 bilat MnGuardAssist with SPC  -four square stepping- 5x CW, 5xCCW cSPC minGuard assist; 1 significant LOB requiring minA for recovery -RetroAMB 2x11ft (SPC at minGuard Assist) Noted limitations from Right dynamic knee valgus, medial arch collapse and hip valgus moment.     PT Short Term Goals - 06/14/18 1247      PT SHORT TERM GOAL #1   Title  Patient will be independent with HEP in order to maintain/improve gains made in physical therapy.    Baseline  Provided ; updated 06/03/2018    Time  4    Period  Weeks    Status  New    Target Date  07/01/18      PT SHORT TERM GOAL #2   Title  Patient will have a NPRS rating of 6/10 or less to represent a clinically significant decrease in pain for improved function at home and in the community.    Baseline  10/10 ; 6/10 in the last 3 weeks  Time  4    Period  Weeks    Status  Achieved        PT Long Term Goals - 06/14/18 1248      PT LONG TERM GOAL #1   Title  Pt will decrease 5TSTS by at least 3 seconds in order to demonstrate clinically significant improvement in LE strength.    Baseline  30.6 sec with BUE support; 25.6 sec with UE support on knees    Time  4    Period  Weeks    Status  On-going    Target Date  07/12/18      PT LONG TERM GOAL #2   Title  Pt will decrease mODI scoreby at least 13 points in order demonstrate clinically significant reduction in back pain/disability.     Baseline  60%; 46% 2/20     Time  4    Period  Weeks    Status  New    Target Date  07/12/18      PT LONG TERM GOAL #3   Title  Patient will be able to walk with LRAD for > 5 minutes, symmetrical stride length, and improved foot clearance during swings without increased genu valgum in order to return to  her PLOF and decrease risk of falls.    Baseline  Household distances <5 min, SPC, shuffling gait with increased genu valgum;    Time  4    Period  Weeks    Status  On-going    Target Date  07/12/18            Plan - 06/25/18 1126    Clinical Impression Statement  Continued to progress strengthening activties. Progressed stepping and multiplanar AMB to target motor control in gait. Pt reports she continues to be limited by her weak legs and is easily fatigued by th eend of day.      Rehab Potential  Good    PT Frequency  2x / week    PT Duration  8 weeks    PT Treatment/Interventions  Aquatic Therapy;Cryotherapy;Electrical Stimulation;Moist Heat;Stair training;Neuromuscular re-education;Gait training;Balance training;Therapeutic exercise;Therapeutic activities;Patient/family education;Manual techniques;Taping;Dry needling;Passive range of motion;Vestibular    PT Next Visit Plan  practice and progress sit to stands; progress BLE strength    PT Home Exercise Plan  HEP updated 06/03/2018    Consulted and Agree with Plan of Care  Patient       Patient will benefit from skilled therapeutic intervention in order to improve the following deficits and impairments:  Abnormal gait, Improper body mechanics, Pain, Decreased mobility, Postural dysfunction, Decreased strength, Decreased endurance, Decreased activity tolerance, Decreased balance, Difficulty walking, Impaired sensation  Visit Diagnosis: Chronic midline low back pain, unspecified whether sciatica present  Other abnormalities of gait and mobility  Muscle weakness (generalized)  Difficulty in walking, not elsewhere classified     Problem List Patient Active Problem List   Diagnosis Date Noted  . Thrombocytopenia (HCC) 05/27/2016  . CHF (congestive heart failure) (HCC) 05/26/2016  . Mesenteric vascular insufficiency (HCC) 02/28/2016  . Pancreas cyst 02/28/2016  . Increased endometrial stripe thickness 02/28/2016  . Carotid  atherosclerosis, bilateral 01/23/2016  . Hearing loss 08/17/2015  . Mitral regurgitation 04/19/2015  . Pernicious anemia 09/27/2014  . Paroxysmal A-fib (HCC) 09/27/2014  . Benign essential HTN 09/27/2014  . Perennial allergic rhinitis 09/27/2014  . Chronic anemia 09/27/2014  . Insomnia, persistent 09/27/2014  . Hypertensive pulmonary vascular disease (HCC) 09/27/2014  . B12 deficiency 09/27/2014  . Diverticulosis of  colon 09/27/2014  . Dyslipidemia 09/27/2014  . Edema extremities 09/27/2014  . Gastric reflux 09/27/2014  . Bilateral hearing loss 09/27/2014  . Polypharmacy 09/27/2014  . Pelvic pain in female 09/27/2014  . MI (mitral incompetence) 09/27/2014  . Internal hemorrhoids 09/27/2014  . Osteopenia 09/27/2014  . Arthralgia of shoulder 09/27/2014  . Finger tendinitis 09/27/2014  . Urge incontinence 09/27/2014  . Cervical prolapse 09/27/2014  . Vertigo 09/27/2014  . Vitamin D deficiency 09/27/2014  . Bladder cystocele 04/15/2012  . Asymmetric septal hypertrophy (HCC) 05/19/2011  . Hypertrophic cardiomyopathy (HCC) 05/19/2011   11:50 AM, 06/25/18 Rosamaria Lints, PT, DPT Physical Therapist - [facility]  Outpatient Physical Therapy- Main Campus [phone]    Rosamaria Lints 06/25/2018, 11:32 AM  [facility] MAIN [facility] SERVICES [address] [place], [place], [address] Phone: [phone]   Fax:  [phone]  Name: Ren Nehls MRN: [id number] Date of Birth: [birth year]

## 2018-06-28 ENCOUNTER — Ambulatory Visit: Payer: Medicare Other

## 2018-06-28 ENCOUNTER — Other Ambulatory Visit: Payer: Self-pay

## 2018-06-28 DIAGNOSIS — R262 Difficulty in walking, not elsewhere classified: Secondary | ICD-10-CM

## 2018-06-28 DIAGNOSIS — R2689 Other abnormalities of gait and mobility: Secondary | ICD-10-CM | POA: Diagnosis not present

## 2018-06-28 DIAGNOSIS — M545 Low back pain: Principal | ICD-10-CM

## 2018-06-28 DIAGNOSIS — M6281 Muscle weakness (generalized): Secondary | ICD-10-CM

## 2018-06-28 DIAGNOSIS — G8929 Other chronic pain: Secondary | ICD-10-CM | POA: Diagnosis not present

## 2018-06-28 NOTE — Therapy (Signed)
[facility] MAIN [facility] SERVICES [address] [place], [place], [address] Phone: [phone]   Fax:  [phone]  Physical Therapy Treatment  Patient Details  Name: Sharon Bullock MRN: [id number] Date of Birth: 05-12-1933 Referring Provider (PT): Faythe Ghee, [place]   Encounter Date: 06/28/2018  PT End of Session - 06/28/18 0947    Visit Number  15    Number of Visits  19    Date for PT Re-Evaluation  07/12/18    Authorization Type  2/10 start 06/03/2018 (eval 04/19/2018)    Authorization - Visit Number  15    Authorization - Number of Visits  17    PT Start Time  0940    PT Stop Time  1015    PT Time Calculation (min)  35 min    Activity Tolerance  Patient tolerated treatment well;No increased pain    Behavior During Therapy  WFL for tasks assessed/performed       Past Medical History:  Diagnosis Date  . Allergy   . Enlarged heart 2000  . GERD (gastroesophageal reflux disease)   . Hypertension   . Insomnia   . Vertigo 2006    Past Surgical History:  Procedure Laterality Date  . BLADDER REPAIR  2014   tact    There were no vitals filed for this visit.  Subjective Assessment - 06/28/18 0946    Subjective  Pt is ok todya, no pain this date, she reports her back has been a sore over the weekend but not today. She went to church yesterday.     Pertinent History  Per MD note: Symptoms started approximately 3 months ago without any inciting event prior to onset. She does recall, however, that she did a lot of cleaning and moving heavy boxes prior to onset but denies any specific injury during this to cause her pain.    Currently in Pain?  No/denies       INTERVENTION THIS DATE:   Therapeutic Exercise: -STS fromelevated plinth withoutBUE support 2x10 (hands across belly)  -Seated Deadlit (Double floor touch)pt at edge of chair, feet/knees wide: 1x15 (yellow weighted ball)   Neuromuscular Reeducation:  -Lateral Side  stepping 2x15 bilat MnGuardAssist with SPC  -four square stepping- 5x CW, 5xCCW cSPC minGuard assist; 1 significant LOB requiring minA for recovery -RetroAMB 1x47ft (SPC at minGuard Assist) Noted limitations from Right dynamic knee valgus, medial arch collapse and hip valgus moment.       PT Short Term Goals - 06/14/18 1247      PT SHORT TERM GOAL #1   Title  Patient will be independent with HEP in order to maintain/improve gains made in physical therapy.    Baseline  Provided ; updated 06/03/2018    Time  4    Period  Weeks    Status  New    Target Date  07/01/18      PT SHORT TERM GOAL #2   Title  Patient will have a NPRS rating of 6/10 or less to represent a clinically significant decrease in pain for improved function at home and in the community.    Baseline  10/10 ; 6/10 in the last 3 weeks    Time  4    Period  Weeks    Status  Achieved        PT Long Term Goals - 06/14/18 1248      PT LONG TERM GOAL #1   Title  Pt will decrease 5TSTS by  at least 3 seconds in order to demonstrate clinically significant improvement in LE strength.    Baseline  30.6 sec with BUE support; 25.6 sec with UE support on knees    Time  4    Period  Weeks    Status  On-going    Target Date  07/12/18      PT LONG TERM GOAL #2   Title  Pt will decrease mODI scoreby at least 13 points in order demonstrate clinically significant reduction in back pain/disability.     Baseline  60%; 46% 2/20     Time  4    Period  Weeks    Status  New    Target Date  07/12/18      PT LONG TERM GOAL #3   Title  Patient will be able to walk with LRAD for > 5 minutes, symmetrical stride length, and improved foot clearance during swings without increased genu valgum in order to return to her PLOF and decrease risk of falls.    Baseline  Household distances <5 min, SPC, shuffling gait with increased genu valgum;    Time  4    Period  Weeks    Status  On-going    Target Date  07/12/18            Plan -  06/28/18 0955    Clinical Impression Statement  Continued with strengthening and dynamic balance activity. Pt demonstrates improved performance with multidirectional stepping activity, with fewer LOB. Strengthening performed latter half as not to retract from quality/performance of balance activity.     Rehab Potential  Good    PT Frequency  2x / week    PT Duration  8 weeks    PT Treatment/Interventions  Aquatic Therapy;Cryotherapy;Electrical Stimulation;Moist Heat;Stair training;Neuromuscular re-education;Gait training;Balance training;Therapeutic exercise;Therapeutic activities;Patient/family education;Manual techniques;Taping;Dry needling;Passive range of motion;Vestibular    PT Next Visit Plan  practice and progress sit to stands; progress BLE strength    PT Home Exercise Plan  HEP updated 06/03/2018    Consulted and Agree with Plan of Care  Patient       Patient will benefit from skilled therapeutic intervention in order to improve the following deficits and impairments:  Abnormal gait, Improper body mechanics, Pain, Decreased mobility, Postural dysfunction, Decreased strength, Decreased endurance, Decreased activity tolerance, Decreased balance, Difficulty walking, Impaired sensation  Visit Diagnosis: Chronic midline low back pain, unspecified whether sciatica present  Other abnormalities of gait and mobility  Muscle weakness (generalized)  Difficulty in walking, not elsewhere classified     Problem List Patient Active Problem List   Diagnosis Date Noted  . Thrombocytopenia (HCC) 05/27/2016  . CHF (congestive heart failure) (HCC) 05/26/2016  . Mesenteric vascular insufficiency (HCC) 02/28/2016  . Pancreas cyst 02/28/2016  . Increased endometrial stripe thickness 02/28/2016  . Carotid atherosclerosis, bilateral 01/23/2016  . Hearing loss 08/17/2015  . Mitral regurgitation 04/19/2015  . Pernicious anemia 09/27/2014  . Paroxysmal A-fib (HCC) 09/27/2014  . Benign essential  HTN 09/27/2014  . Perennial allergic rhinitis 09/27/2014  . Chronic anemia 09/27/2014  . Insomnia, persistent 09/27/2014  . Hypertensive pulmonary vascular disease (HCC) 09/27/2014  . B12 deficiency 09/27/2014  . Diverticulosis of colon 09/27/2014  . Dyslipidemia 09/27/2014  . Edema extremities 09/27/2014  . Gastric reflux 09/27/2014  . Bilateral hearing loss 09/27/2014  . Polypharmacy 09/27/2014  . Pelvic pain in female 09/27/2014  . MI (mitral incompetence) 09/27/2014  . Internal hemorrhoids 09/27/2014  . Osteopenia 09/27/2014  . Arthralgia of shoulder 09/27/2014  .  Finger tendinitis 09/27/2014  . Urge incontinence 09/27/2014  . Cervical prolapse 09/27/2014  . Vertigo 09/27/2014  . Vitamin D deficiency 09/27/2014  . Bladder cystocele 04/15/2012  . Asymmetric septal hypertrophy (HCC) 05/19/2011  . Hypertrophic cardiomyopathy (HCC) 05/19/2011   10:12 AM, 06/28/18 Rosamaria Lints, PT, DPT Physical Therapist - [facility]  Outpatient Physical Therapy- Main Campus [phone]     Rosamaria Lints 06/28/2018, 10:12 AM  [place] [facility] MAIN [facility] SERVICES [address] [place], [place], [address] Phone: [phone]   Fax:  [phone]  Name: Sharon Bullock MRN: [id number] Date of Birth: 1933-06-18

## 2018-06-30 ENCOUNTER — Other Ambulatory Visit: Payer: Self-pay

## 2018-06-30 ENCOUNTER — Ambulatory Visit: Payer: Medicare Other

## 2018-06-30 DIAGNOSIS — R262 Difficulty in walking, not elsewhere classified: Secondary | ICD-10-CM

## 2018-06-30 DIAGNOSIS — R2689 Other abnormalities of gait and mobility: Secondary | ICD-10-CM

## 2018-06-30 DIAGNOSIS — M545 Low back pain: Principal | ICD-10-CM

## 2018-06-30 DIAGNOSIS — G8929 Other chronic pain: Secondary | ICD-10-CM | POA: Diagnosis not present

## 2018-06-30 DIAGNOSIS — M6281 Muscle weakness (generalized): Secondary | ICD-10-CM

## 2018-06-30 NOTE — Therapy (Signed)
[place] [facility] MAIN [facility] SERVICES [address] [place], [place], [address] Phone: [phone]   Fax:  [phone]  Physical Therapy Treatment  Patient Details  Name: Sharon Bullock MRN: [id number] Date of Birth: 02/04/1934 Referring Provider (PT): Faythe Ghee, [place]   Encounter Date: 06/30/2018  PT End of Session - 06/30/18 1034    Visit Number  16    Number of Visits  19    Date for PT Re-Evaluation  07/12/18    Authorization Type  2/10 start 06/03/2018 (eval 04/19/2018)    PT Start Time  1029   pt arrived late    PT Stop Time  1059    PT Time Calculation (min)  30 min    Activity Tolerance  Patient tolerated treatment well;No increased pain    Behavior During Therapy  WFL for tasks assessed/performed       Past Medical History:  Diagnosis Date  . Allergy   . Enlarged heart 2000  . GERD (gastroesophageal reflux disease)   . Hypertension   . Insomnia   . Vertigo 2006    Past Surgical History:  Procedure Laterality Date  . BLADDER REPAIR  2014   tact    There were no vitals filed for this visit.  Subjective Assessment - 06/30/18 1034    Subjective  Pt doing ok today. Says she is still constantly sore. No other updates since her last session.     Pertinent History  Per MD note: Symptoms started approximately 3 months ago without any inciting event prior to onset. She does recall, however, that she did a lot of cleaning and moving heavy boxes prior to onset but denies any specific injury during this to cause her pain.    Currently in Pain?  No/denies       INTERVENTION THIS DATE:     Therapeutic Exercise: -STS from chair+airex without BUE support 2x10 (hands across belly)  -Seated Deadlit (Double floor touch) pt at edge of chair, feet/knees wide: 1x15 (yellow weighted ball)  -Seated LAQ 2x10 bilat c 3lb ankle weights    Neuromuscular Reeducation:  -Lateral Side stepping no UE assistance in // bars at supervision level:  2x2ft bilat  -FWD AMB no UE assistance in // bars at supervision level: 1x80ft  -RetroAMB no UE assistance in // bars at supervision level: 2x11ft  -Fwd step taps 2x10 bilat, 2" step     PT Short Term Goals - 06/14/18 1247      PT SHORT TERM GOAL #1   Title  Patient will be independent with HEP in order to maintain/improve gains made in physical therapy.    Baseline  Provided ; updated 06/03/2018    Time  4    Period  Weeks    Status  New    Target Date  07/01/18      PT SHORT TERM GOAL #2   Title  Patient will have a NPRS rating of 6/10 or less to represent a clinically significant decrease in pain for improved function at home and in the community.    Baseline  10/10 ; 6/10 in the last 3 weeks    Time  4    Period  Weeks    Status  Achieved        PT Long Term Goals - 06/14/18 1248      PT LONG TERM GOAL #1   Title  Pt will decrease 5TSTS by at least 3 seconds in order to demonstrate clinically significant  improvement in LE strength.    Baseline  30.6 sec with BUE support; 25.6 sec with UE support on knees    Time  4    Period  Weeks    Status  On-going    Target Date  07/12/18      PT LONG TERM GOAL #2   Title  Pt will decrease mODI scoreby at least 13 points in order demonstrate clinically significant reduction in back pain/disability.     Baseline  60%; 46% 2/20     Time  4    Period  Weeks    Status  New    Target Date  07/12/18      PT LONG TERM GOAL #3   Title  Patient will be able to walk with LRAD for > 5 minutes, symmetrical stride length, and improved foot clearance during swings without increased genu valgum in order to return to her PLOF and decrease risk of falls.    Baseline  Household distances <5 min, SPC, shuffling gait with increased genu valgum;    Time  4    Period  Weeks    Status  On-going    Target Date  07/12/18            Plan - 06/30/18 1037    Clinical Impression Statement  Continued with current POC, session focused on  strengthening BLE and improving dynamic balance with multidirectional balance. Pt requires rest breaks after each exercise to allow for recovery. Added in LAQ this date with ankle weights which really spoke to degree of strength deficits that right knee has in extension. Pt would benefit from more specific targeting of this group in future sessions.     Rehab Potential  Good    PT Frequency  2x / week    PT Duration  8 weeks    PT Treatment/Interventions  Aquatic Therapy;Cryotherapy;Electrical Stimulation;Moist Heat;Stair training;Neuromuscular re-education;Gait training;Balance training;Therapeutic exercise;Therapeutic activities;Patient/family education;Manual techniques;Taping;Dry needling;Passive range of motion;Vestibular    PT Next Visit Plan  practice and progress sit to stands; progress BLE strength    PT Home Exercise Plan  HEP updated 06/03/2018    Consulted and Agree with Plan of Care  Patient       Patient will benefit from skilled therapeutic intervention in order to improve the following deficits and impairments:  Abnormal gait, Improper body mechanics, Pain, Decreased mobility, Postural dysfunction, Decreased strength, Decreased endurance, Decreased activity tolerance, Decreased balance, Difficulty walking, Impaired sensation  Visit Diagnosis: Chronic midline low back pain, unspecified whether sciatica present  Other abnormalities of gait and mobility  Muscle weakness (generalized)  Difficulty in walking, not elsewhere classified     Problem List Patient Active Problem List   Diagnosis Date Noted  . Thrombocytopenia (HCC) 05/27/2016  . CHF (congestive heart failure) (HCC) 05/26/2016  . Mesenteric vascular insufficiency (HCC) 02/28/2016  . Pancreas cyst 02/28/2016  . Increased endometrial stripe thickness 02/28/2016  . Carotid atherosclerosis, bilateral 01/23/2016  . Hearing loss 08/17/2015  . Mitral regurgitation 04/19/2015  . Pernicious anemia 09/27/2014  .  Paroxysmal A-fib (HCC) 09/27/2014  . Benign essential HTN 09/27/2014  . Perennial allergic rhinitis 09/27/2014  . Chronic anemia 09/27/2014  . Insomnia, persistent 09/27/2014  . Hypertensive pulmonary vascular disease (HCC) 09/27/2014  . B12 deficiency 09/27/2014  . Diverticulosis of colon 09/27/2014  . Dyslipidemia 09/27/2014  . Edema extremities 09/27/2014  . Gastric reflux 09/27/2014  . Bilateral hearing loss 09/27/2014  . Polypharmacy 09/27/2014  . Pelvic pain in female 09/27/2014  .  MI (mitral incompetence) 09/27/2014  . Internal hemorrhoids 09/27/2014  . Osteopenia 09/27/2014  . Arthralgia of shoulder 09/27/2014  . Finger tendinitis 09/27/2014  . Urge incontinence 09/27/2014  . Cervical prolapse 09/27/2014  . Vertigo 09/27/2014  . Vitamin D deficiency 09/27/2014  . Bladder cystocele 04/15/2012  . Asymmetric septal hypertrophy (HCC) 05/19/2011  . Hypertrophic cardiomyopathy (HCC) 05/19/2011   10:57 AM, 06/30/18 Rosamaria Lints, PT, DPT Physical Therapist - [facility]  Outpatient Physical Therapy- Main Campus [phone]    Rosamaria Lints 06/30/2018, 10:40 AM  [place] [facility] MAIN [facility] SERVICES [address] [place], [place], [address] Phone: [phone]   Fax:  [phone]  Name: Agustin Barczak MRN: [id number] Date of Birth: July 30, 1933

## 2018-07-05 ENCOUNTER — Encounter: Payer: Self-pay | Admitting: Physical Therapy

## 2018-07-05 NOTE — Therapy (Signed)
[place] [facility] MAIN [facility] SERVICES [address] [place], [place], [address] Phone: [phone]   Fax:  [phone]  Patient Details  Name: Sharon Bullock MRN: [id number] Date of Birth: 12-09-1933 Referring Provider:  No ref. provider found  Encounter Date: 07/05/2018  Patient was called to inform that the clinic is closed due to the pandemic and that she will be called when our clinic re opens Ezekiel Ina, [place] DPT 07/05/2018, 4:20 PM  [place] [facility] MAIN [facility] SERVICES [address] [place], [place], [address] Phone: [phone]   Fax:  [phone]

## 2018-07-06 ENCOUNTER — Ambulatory Visit: Payer: Medicare Other | Admitting: Physical Therapy

## 2018-07-06 ENCOUNTER — Encounter: Payer: Self-pay | Admitting: Physical Therapy

## 2018-07-06 NOTE — Therapy (Signed)
[place] [facility] MAIN [facility] SERVICES [address] [place], [place], [address] Phone: [phone]   Fax:  [phone]  Patient Details  Name: Sharon Bullock MRN: [id number] Date of Birth: 1934/03/20 Referring Provider:  No ref. provider found  Encounter Date: 07/06/2018 Patient was called about the clinic needing to be closed due to the pandemic and that we will be back in touch when we are able to reopen.   Jones Broom S,PT DPT 07/06/2018, 9:14 AM  [place] [facility] MAIN [facility] SERVICES [address] [place], [place], [address] Phone: [phone]   Fax:  [phone]

## 2018-07-08 ENCOUNTER — Ambulatory Visit: Payer: Medicare Other | Admitting: Physical Therapy

## 2018-07-28 NOTE — Therapy (Signed)
[place] [facility] MAIN [facility] SERVICES [address] [place], [place], [address] Phone: [phone]   Fax:  [phone]  Patient Details  Name: Sharon Bullock MRN: [id number] Date of Birth: [birth year] Referring Provider:  No ref. provider found  Encounter Date: 07/28/2018  The [facility] outpatient clinics are closed at this time due to COVID-19. The patient was contacted in regards to therapy services. Education provided about telehealth therapy services. Pt refuses telehealth services at this time. The patient is in agreement that they are safe and consent to being on hold for therapy services until the [facility] outpatient facilities reopen. At which time, the patient will be contacted to schedule an appointment to resume therapy services.   Lynnea Maizes PT, DPT, GCS  Huprich,Jason 07/28/2018, 9:45 AM  [place] [facility] MAIN [facility] SERVICES [address] [place], [place], [address] Phone: [phone]   Fax:  [phone]

## 2018-08-03 ENCOUNTER — Encounter: Payer: Self-pay | Admitting: Family Medicine

## 2018-08-03 ENCOUNTER — Other Ambulatory Visit: Payer: Self-pay

## 2018-08-03 ENCOUNTER — Ambulatory Visit (INDEPENDENT_AMBULATORY_CARE_PROVIDER_SITE_OTHER): Payer: Medicare Other | Admitting: Family Medicine

## 2018-08-03 DIAGNOSIS — I10 Essential (primary) hypertension: Secondary | ICD-10-CM

## 2018-08-03 DIAGNOSIS — E785 Hyperlipidemia, unspecified: Secondary | ICD-10-CM

## 2018-08-03 DIAGNOSIS — F325 Major depressive disorder, single episode, in full remission: Secondary | ICD-10-CM | POA: Diagnosis not present

## 2018-08-03 DIAGNOSIS — G4700 Insomnia, unspecified: Secondary | ICD-10-CM

## 2018-08-03 DIAGNOSIS — I422 Other hypertrophic cardiomyopathy: Secondary | ICD-10-CM

## 2018-08-03 DIAGNOSIS — F321 Major depressive disorder, single episode, moderate: Secondary | ICD-10-CM | POA: Insufficient documentation

## 2018-08-03 DIAGNOSIS — I480 Paroxysmal atrial fibrillation: Secondary | ICD-10-CM | POA: Diagnosis not present

## 2018-08-03 MED ORDER — PRADAXA 150 MG PO CAPS: 150.0000 mg | ORAL_CAPSULE | Freq: Two times a day (BID) | ORAL | 1 refills | Status: DC

## 2018-08-03 MED ORDER — IRBESARTAN 300 MG PO TABS: ORAL_TABLET | ORAL | 1 refills | Status: DC

## 2018-08-03 MED ORDER — ZOLPIDEM TARTRATE 5 MG PO TABS: 5.0000 mg | ORAL_TABLET | Freq: Every evening | ORAL | 0 refills | Status: DC | PRN

## 2018-08-03 MED ORDER — ROSUVASTATIN CALCIUM 10 MG PO TABS: 10.0000 mg | ORAL_TABLET | Freq: Every day | ORAL | 1 refills | Status: DC

## 2018-08-03 NOTE — Progress Notes (Signed)
Name: Sharon Bullock   MRN: [id number]    DOB: Apr 21, 1933   Date:08/03/2018       Progress Note  Subjective  Chief Complaint  Chief Complaint  Patient presents with  . Hypertension  . Atrial Fibrillation  . Congestive Heart Failure  . Gastroesophageal Reflux    I connected with  [name] on 08/03/18 at 10:40 AM EDT by telephone and verified that I am speaking with the correct person using two identifiers.  I discussed the limitations, risks, security and privacy concerns of performing an evaluation and management service by telephone and the availability of in person appointments. Staff also discussed with the patient that there may be a patient responsible charge related to this service. Patient Location: home  Provider Location: [facility]  HPI  ZJI:RCVEL pain, SOB or palpitation.  BP at home has been controlled, today it was 143/70  Carotid atherosclerosis: found during MRI head done at [facility] at [facility] for evaluation of vertigo in 12/2015. On medical management at this time. She also has mesenteric insufficiency, she denies abdominal pain or blood in stools.Taking Crestor and Pradaxa daily, no side effects of medications  Afib: she sees a cardiologist - Dr. Regino Schultze at [facility]. She has been on Pradaxa, she has occasional fluttering ( she states only when has significant allergy symptoms)  she has lower extremity  edema, she deniesdecrease in exercise tolerance.Unchanged   Insomnia: She tried Trazodone without help, only able to sleep one hour per night,she is on Ambien 10 mg, tried Ambien 5 mg without much help,but insurance stopped paying for it, so she is back on 5 mg. She states having urinary frequency again and will go back to Urologist again to get evaluated. No dysuria or blood in urine  Hypertrophic cardiomyopathy and hypertensive pulmonary vascular disease: she sees Dr. Regino Schultze, no chest pain, SOB orparoxysmal nocturnal dyspnea, the bilateral swelling on legs  is stable, better in am and worse at night . She is on ARB, beta-blockerand Pradaxa for afib.   Dyslipidemia: she is on Crestor and CoQ 10, reviewed labs and they are at goal, denies side effects of medication. Unchanged   Thrombocytopenia: seeing hematologist- she went back to Dr. Smith Robert 05/2018 , she is still getting B12 injections because of history of pernicious anemia. She also has normocitic anemia, she denies fatigue  Left lower back pain: she got worse, seen by Maurice Small. She also seen by [facility] and advised to have PT, now on hold secondary to COVID-19, she is still doing some exercises at home Pain on her back has improved on the left side, but now present on the right side   MDD: severe episode last year that lasted months, was the same time that she was grieving but doing well at this time  Patient Active Problem List   Diagnosis Date Noted  . Moderate major depression, single episode (HCC) 08/03/2018  . Thrombocytopenia (HCC) 05/27/2016  . CHF (congestive heart failure) (HCC) 05/26/2016  . Mesenteric vascular insufficiency (HCC) 02/28/2016  . Pancreas cyst 02/28/2016  . Increased endometrial stripe thickness 02/28/2016  . Carotid atherosclerosis, bilateral 01/23/2016  . Hearing loss 08/17/2015  . Mitral regurgitation 04/19/2015  . Pernicious anemia 09/27/2014  . Paroxysmal A-fib (HCC) 09/27/2014  . Benign essential HTN 09/27/2014  . Perennial allergic rhinitis 09/27/2014  . Chronic anemia 09/27/2014  . Insomnia, persistent 09/27/2014  . Hypertensive pulmonary vascular disease (HCC) 09/27/2014  . B12 deficiency 09/27/2014  . Diverticulosis of colon 09/27/2014  .  Dyslipidemia 09/27/2014  . Edema extremities 09/27/2014  . Gastric reflux 09/27/2014  . Bilateral hearing loss 09/27/2014  . Polypharmacy 09/27/2014  . Pelvic pain in female 09/27/2014  . MI (mitral incompetence) 09/27/2014  . Internal hemorrhoids 09/27/2014  . Osteopenia 09/27/2014  . Arthralgia  of shoulder 09/27/2014  . Finger tendinitis 09/27/2014  . Urge incontinence 09/27/2014  . Cervical prolapse 09/27/2014  . Vertigo 09/27/2014  . Vitamin D deficiency 09/27/2014  . Bladder cystocele 04/15/2012  . Asymmetric septal hypertrophy (HCC) 05/19/2011  . Hypertrophic cardiomyopathy (HCC) 05/19/2011    Past Surgical History:  Procedure Laterality Date  . BLADDER REPAIR  2014   tact    Family History  Problem Relation Age of Onset  . Alzheimer's disease Mother   . Hypertension Mother   . Stroke Father   . Heart disease Father   . Heart disease Brother   . Hypertension Brother   . Cancer Daughter        breast  . Hypertension Daughter   . Breast cancer Daughter   . Diabetes Brother   . Heart disease Brother   . Heart disease Brother   . Diabetes Brother   . Hypertension Brother   . Heart disease Brother   . Hypertension Brother   . Cancer Brother        prostate  . Diabetes Brother   . Heart disease Brother   . Hypertension Brother   . Cancer Brother        prostate    Social History   Socioeconomic History  . Marital status: Widowed    Spouse name: Not on file  . Number of children: 5  . Years of education: some college  . Highest education level: 12th grade  Occupational History  . Occupation: Retired  Engineer, production  . Financial resource strain: Not hard at all  . Food insecurity:    Worry: Never true    Inability: Never true  . Transportation needs:    Medical: No    Non-medical: No  Tobacco Use  . Smoking status: Never Smoker  . Smokeless tobacco: Never Used  . Tobacco comment: smoking cessation materials not required  Substance and Sexual Activity  . Alcohol use: No    Alcohol/week: 0.0 standard drinks  . Drug use: No  . Sexual activity: Not Currently  Lifestyle  . Physical activity:    Days per week: 0 days    Minutes per session: 0 min  . Stress: To some extent  Relationships  . Social connections:    Talks on phone: Patient  refused    Gets together: Patient refused    Attends religious service: Patient refused    Active member of club or organization: Patient refused    Attends meetings of clubs or organizations: Patient refused    Relationship status: Widowed  . Intimate partner violence:    Fear of current or ex partner: No    Emotionally abused: No    Physically abused: No    Forced sexual activity: No  Other Topics Concern  . Not on file  Social History Narrative   Lost husband 07/18/2017   Grown son still living with her      Current Outpatient Medications:  .  amiodarone (PACERONE) 200 MG tablet, Take 1 tablet by mouth daily., Disp: , Rfl:  .  amLODipine (NORVASC) 10 MG tablet, TAKE 1 TABLET(10 MG) BY MOUTH DAILY, Disp: 90 tablet, Rfl: 1 .  azelastine (ASTELIN) 0.1 % nasal spray,  Place 2 sprays into both nostrils 2 (two) times daily. Use in each nostril as directed, Disp: 30 mL, Rfl: 12 .  calcium-vitamin D (OSCAL WITH D) 500-200 MG-UNIT tablet, Take 1 tablet by mouth 2 (two) times daily., Disp: , Rfl:  .  carvedilol (COREG) 25 MG tablet, TAKE 1 TABLET(25 MG) BY MOUTH TWICE DAILY WITH A MEAL, Disp: 180 tablet, Rfl: 1 .  COENZYME Q10 PO, Take 30 mg by mouth daily., Disp: , Rfl:  .  diclofenac sodium (VOLTAREN) 1 % GEL, APPLY TO WRIST TWICE DAILY, Disp: 100 g, Rfl: 0 .  ferrous gluconate (FERGON) 324 MG tablet, Take 324 mg by mouth daily with breakfast., Disp: , Rfl:  .  fluticasone (FLONASE) 50 MCG/ACT nasal spray, Place 2 sprays into both nostrils daily., Disp: 16 g, Rfl: 6 .  folic acid (FOLVITE) 400 MCG tablet, Take 1 tablet by mouth daily. Reported on 04/19/2015, Disp: , Rfl:  .  irbesartan (AVAPRO) 300 MG tablet, TAKE 1 TABLET BY MOUTH EVERY DAY FOR BLOOD PRESSURE, Disp: 90 tablet, Rfl: 1 .  omeprazole (PRILOSEC) 40 MG capsule, TAKE 1 CAPSULE(40 MG) BY MOUTH DAILY, Disp: 90 capsule, Rfl: 1 .  PRADAXA 150 MG CAPS capsule, Take 1 capsule (150 mg total) by mouth 2 (two) times daily., Disp: 180  capsule, Rfl: 1 .  rosuvastatin (CRESTOR) 10 MG tablet, TAKE 1 TABLET BY MOUTH DAILY WITH CO-Q10, Disp: 90 tablet, Rfl: 1 .  Vitamin D, Cholecalciferol, 1000 UNITS TABS, Take 1 tablet by mouth daily., Disp: , Rfl:  .  zolpidem (AMBIEN) 5 MG tablet, Take 1 tablet (5 mg total) by mouth at bedtime as needed for sleep., Disp: 90 tablet, Rfl: 0  Current Facility-Administered Medications:  .  cyanocobalamin ((VITAMIN B-12)) injection 1,000 mcg, 1,000 mcg, Intramuscular, Q30 days, Alba Cory, MD, 1,000 mcg at 04/23/18 1020  Allergies  Allergen Reactions  . Nickel Dermatitis and Swelling  . Other Anaphylaxis    Uncoded Allergy. Allergen: SHELLFISH  . Ace Inhibitors Cough    I personally reviewed active problem list, medication list, allergies, family history, social history with the patient/caregiver today.   ROS  Ten systems reviewed and is negative except as mentioned in HPI   Objective  Virtual encounter, vitals obtained at home   Body mass index is 26.29 kg/m.  Physical Exam  Awake, alert and oriented   PHQ2/9: Depression screen [facility] 2/9 08/03/2018 04/23/2018 03/04/2018 01/12/2018 01/12/2018  Decreased Interest 0 0 0 2 2  Down, Depressed, Hopeless 0 0 0 2 1  PHQ - 2 Score 0 0 0 4 3  Altered sleeping 0 3 0 2 2  Tired, decreased energy 0 1 0 2 2  Change in appetite 0 0 0 3 1  Feeling bad or failure about yourself  0 1 0 0 0  Trouble concentrating 0 0 0 0 0  Moving slowly or fidgety/restless 0 3 0 1 2  Suicidal thoughts 0 0 0 0 0  PHQ-9 Score 0 8 0 12 10  Difficult doing work/chores - Very difficult Not difficult at all Not difficult at all Somewhat difficult   PHQ-2/9 Result is negative.    Fall Risk: Fall Risk  04/23/2018 03/04/2018 01/12/2018 01/12/2018 10/07/2017  Falls in the past year? 0 0 No No No  Number falls in past yr: 0 - - - -  Injury with Fall? 0 0 - - -  Risk for fall due to : - - Impaired vision;Impaired balance/gait;Medication side effect;History of  fall(s);Mental status change - -  Risk for fall due to: Comment - - ambualtes with cane, new pain to L extremity resulting in referral to ortho; wears eyeglasses; recent depression resulting in start of Zoloft - -  Follow up - - - - -     Assessment & Plan  1. Benign essential HTN  - irbesartan (AVAPRO) 300 MG tablet; TAKE 1 TABLET BY MOUTH EVERY DAY FOR BLOOD PRESSURE  Dispense: 90 tablet; Refill: 1  2. Paroxysmal A-fib (HCC)  - PRADAXA 150 MG CAPS capsule; Take 1 capsule (150 mg total) by mouth 2 (two) times daily.  Dispense: 180 capsule; Refill: 1  3. Dyslipidemia  - rosuvastatin (CRESTOR) 10 MG tablet  Dispense: 90 tablet; Refill: 1  4. Insomnia, persistent  - zolpidem (AMBIEN) 5 MG tablet; Take 1 tablet (5 mg total) by mouth at bedtime as needed for sleep.  Dispense: 90 tablet; Refill: 0  5. Hypertrophic cardiomyopathy (HCC)   6. Major Depression in remission(HCC)  Doing well   I discussed the assessment and treatment plan with the patient. The patient was provided an opportunity to ask questions and all were answered. The patient agreed with the plan and demonstrated an understanding of the instructions.   The patient was advised to call back or seek an in-person evaluation if the symptoms worsen or if the condition fails to improve as anticipated.  I provided 21 minutes of non-face-to-face time during this encounter.  Ruel FavorsKrichna F Mellany Dinsmore, MD

## 2018-09-27 DIAGNOSIS — I422 Other hypertrophic cardiomyopathy: Secondary | ICD-10-CM | POA: Diagnosis not present

## 2018-09-27 DIAGNOSIS — I10 Essential (primary) hypertension: Secondary | ICD-10-CM | POA: Diagnosis not present

## 2018-09-27 DIAGNOSIS — Z79899 Other long term (current) drug therapy: Secondary | ICD-10-CM | POA: Diagnosis not present

## 2018-09-27 DIAGNOSIS — J302 Other seasonal allergic rhinitis: Secondary | ICD-10-CM | POA: Diagnosis not present

## 2018-09-27 DIAGNOSIS — I480 Paroxysmal atrial fibrillation: Secondary | ICD-10-CM | POA: Diagnosis not present

## 2018-10-01 ENCOUNTER — Telehealth: Payer: Self-pay | Admitting: Family Medicine

## 2018-10-01 DIAGNOSIS — J3089 Other allergic rhinitis: Secondary | ICD-10-CM

## 2018-10-01 NOTE — Telephone Encounter (Signed)
Patient is requesting a referral for  [facility].  [address], [place] [address] [phone] Call back [phone]

## 2018-10-01 NOTE — Telephone Encounter (Signed)
Patient is calling back regarding her request for a referral.  Informed patient of the message from the doctor as to why she needs the referral.  Patient stated that she needs it for her allergies and sinus.  If there is any problem, patient would like a call as soon as possible at [phone]

## 2018-10-05 NOTE — Telephone Encounter (Signed)
Pt calling again to see if there is any update to this referral.

## 2018-10-05 NOTE — Telephone Encounter (Signed)
Pt called stating the reason she is needing to been seen by specialist is because her allergy drainage packs in her chest and affects her breathing. Please advise.

## 2018-10-06 NOTE — Telephone Encounter (Signed)
Patient has been informed that the referral has been sent as requested.

## 2018-11-04 ENCOUNTER — Ambulatory Visit: Payer: Medicare Other | Admitting: Family Medicine

## 2018-11-05 ENCOUNTER — Other Ambulatory Visit: Payer: Self-pay

## 2018-11-05 ENCOUNTER — Encounter: Payer: Self-pay | Admitting: Family Medicine

## 2018-11-05 ENCOUNTER — Ambulatory Visit (INDEPENDENT_AMBULATORY_CARE_PROVIDER_SITE_OTHER): Payer: Medicare Other | Admitting: Family Medicine

## 2018-11-05 VITALS — BP 130/62 | HR 58 | Temp 96.9°F | Resp 16 | Ht 65.0 in | Wt 159.4 lb

## 2018-11-05 DIAGNOSIS — F325 Major depressive disorder, single episode, in full remission: Secondary | ICD-10-CM | POA: Diagnosis not present

## 2018-11-05 DIAGNOSIS — G4700 Insomnia, unspecified: Secondary | ICD-10-CM | POA: Diagnosis not present

## 2018-11-05 DIAGNOSIS — I480 Paroxysmal atrial fibrillation: Secondary | ICD-10-CM

## 2018-11-05 DIAGNOSIS — K219 Gastro-esophageal reflux disease without esophagitis: Secondary | ICD-10-CM | POA: Diagnosis not present

## 2018-11-05 DIAGNOSIS — I422 Other hypertrophic cardiomyopathy: Secondary | ICD-10-CM

## 2018-11-05 DIAGNOSIS — I10 Essential (primary) hypertension: Secondary | ICD-10-CM

## 2018-11-05 DIAGNOSIS — E559 Vitamin D deficiency, unspecified: Secondary | ICD-10-CM | POA: Diagnosis not present

## 2018-11-05 DIAGNOSIS — K551 Chronic vascular disorders of intestine: Secondary | ICD-10-CM

## 2018-11-05 DIAGNOSIS — E785 Hyperlipidemia, unspecified: Secondary | ICD-10-CM

## 2018-11-05 DIAGNOSIS — E538 Deficiency of other specified B group vitamins: Secondary | ICD-10-CM | POA: Diagnosis not present

## 2018-11-05 DIAGNOSIS — R351 Nocturia: Secondary | ICD-10-CM | POA: Diagnosis not present

## 2018-11-05 DIAGNOSIS — D696 Thrombocytopenia, unspecified: Secondary | ICD-10-CM | POA: Diagnosis not present

## 2018-11-05 MED ORDER — SOLIFENACIN SUCCINATE 5 MG PO TABS: 5.0000 mg | ORAL_TABLET | Freq: Every day | ORAL | 2 refills | Status: AC

## 2018-11-05 MED ORDER — CYANOCOBALAMIN 1000 MCG/ML IJ SOLN
1000.0000 ug | Freq: Once | INTRAMUSCULAR | Status: AC
  Administered 2018-11-05: 1000 ug via INTRAMUSCULAR

## 2018-11-05 MED ORDER — CARVEDILOL 25 MG PO TABS: ORAL_TABLET | ORAL | 1 refills | Status: DC

## 2018-11-05 MED ORDER — ZOLPIDEM TARTRATE 5 MG PO TABS: 5.0000 mg | ORAL_TABLET | Freq: Every evening | ORAL | 0 refills | Status: DC | PRN

## 2018-11-05 MED ORDER — OMEPRAZOLE 40 MG PO CPDR: 40.0000 mg | DELAYED_RELEASE_CAPSULE | Freq: Every day | ORAL | 1 refills | Status: DC

## 2018-11-05 MED ORDER — AMLODIPINE BESYLATE 10 MG PO TABS: 10.0000 mg | ORAL_TABLET | Freq: Every day | ORAL | 1 refills | Status: DC

## 2018-11-05 NOTE — Progress Notes (Signed)
Name: Sharon Bullock   MRN: [id number]    DOB: [birth year]   Date:11/05/2018       Progress Note  Subjective  Chief Complaint  Chief Complaint  Patient presents with  . Medication Refill    3 month F/U  . Hypertension    Constant trouble with her feet swelling  . Atrial Fibrillation  . Insomnia    States it is not well as it should be due to urination issues  . Dyslipidemia  . Depression    HPI  HTN: chest pain, SOB or palpitation.  BP at home has been controlled, today it was normal.   Carotid atherosclerosis: found during MRI head done at [facility] for evaluation of vertigo in 12/2015. On medical management at this time. She also has mesenteric insufficiency, she denies abdominal pain or blood in stools.Taking Crestor and Pradaxa daily, no side effects of medications  Afib: she sees a cardiologist - Dr. Regino Schultze at [facility]. She has been on Pradaxa, shehas occasional fluttering ( she states only when has significant allergy symptoms)she has lower extremityedema, she deniesdecrease in exercise tolerance. Unchanged   Insomnia: She tried Trazodone without help, only able to sleep one hour per night,she is on Ambien 10 mg, tried Ambien 5 mg without much help,but insurance stopped paying for it, so she is back on 5 mg. She states having urinary frequency again and would like to resume medication, she states ditropan caused a lot of dry mouth, we will try Vesica  Hypertrophic cardiomyopathy and hypertensive pulmonary vascular disease: she sees Dr. Regino Schultze, no chest pain, SOB orparoxysmal nocturnal dyspnea, the bilateral swelling on legs is stable, better in am and worse at night .She is on ARB, beta-blockerand Pradaxa for afib. Stable   Dyslipidemia: she is on Crestor and CoQ 10, denies side effects of medication. Reviewed last labs   Thrombocytopenia: seeing hematologist- she went back to Dr. Smith Robert 05/2018 , she is still getting B12 injections because of history of pernicious  anemia. She also has normocitic anemia, she denies fatigue  Left lower back pain:she got worse, seen by Maurice Small. She also seen by [facility] and advised to have PT, and symptoms improved, but only on home PT now because of COVID-19 Today left side is doing well, but having some discomfort on right side, mostly on right knee, advised her to go back   MDD: severe episode last year that lasted months, she still has fatigue but is doing well in terms of depression   Nocturia: history of bladder tac and has notice urinary frequency again, she will follow up with Urologist    Patient Active Problem List   Diagnosis Date Noted  . Moderate major depression, single episode (HCC) 08/03/2018  . Thrombocytopenia (HCC) 05/27/2016  . CHF (congestive heart failure) (HCC) 05/26/2016  . Mesenteric vascular insufficiency (HCC) 02/28/2016  . Pancreas cyst 02/28/2016  . Increased endometrial stripe thickness 02/28/2016  . Carotid atherosclerosis, bilateral 01/23/2016  . Hearing loss 08/17/2015  . Mitral regurgitation 04/19/2015  . Pernicious anemia 09/27/2014  . Paroxysmal A-fib (HCC) 09/27/2014  . Benign essential HTN 09/27/2014  . Perennial allergic rhinitis 09/27/2014  . Chronic anemia 09/27/2014  . Insomnia, persistent 09/27/2014  . Hypertensive pulmonary vascular disease (HCC) 09/27/2014  . B12 deficiency 09/27/2014  . Diverticulosis of colon 09/27/2014  . Dyslipidemia 09/27/2014  . Edema extremities 09/27/2014  . Gastric reflux 09/27/2014  . Bilateral hearing loss 09/27/2014  . Polypharmacy 09/27/2014  . Pelvic pain in female 09/27/2014  .  MI (mitral incompetence) 09/27/2014  . Internal hemorrhoids 09/27/2014  . Osteopenia 09/27/2014  . Arthralgia of shoulder 09/27/2014  . Finger tendinitis 09/27/2014  . Urge incontinence 09/27/2014  . Cervical prolapse 09/27/2014  . Vertigo 09/27/2014  . Vitamin D deficiency 09/27/2014  . Bladder cystocele 04/15/2012  . Asymmetric septal  hypertrophy (HCC) 05/19/2011  . Hypertrophic cardiomyopathy (HCC) 05/19/2011    Past Surgical History:  Procedure Laterality Date  . BLADDER REPAIR  2014   tact    Family History  Problem Relation Age of Onset  . Alzheimer's disease Mother   . Hypertension Mother   . Stroke Father   . Heart disease Father   . Heart disease Brother   . Hypertension Brother   . Cancer Daughter        breast  . Hypertension Daughter   . Breast cancer Daughter   . Diabetes Brother   . Heart disease Brother   . Heart disease Brother   . Diabetes Brother   . Hypertension Brother   . Heart disease Brother   . Hypertension Brother   . Cancer Brother        prostate  . Diabetes Brother   . Heart disease Brother   . Hypertension Brother   . Cancer Brother        prostate    Social History   Socioeconomic History  . Marital status: Widowed    Spouse name: Not on file  . Number of children: 5  . Years of education: some college  . Highest education level: 12th grade  Occupational History  . Occupation: Retired  Engineer, productionocial Needs  . Financial resource strain: Not hard at all  . Food insecurity    Worry: Never true    Inability: Never true  . Transportation needs    Medical: No    Non-medical: No  Tobacco Use  . Smoking status: Never Smoker  . Smokeless tobacco: Never Used  . Tobacco comment: smoking cessation materials not required  Substance and Sexual Activity  . Alcohol use: No    Alcohol/week: 0.0 standard drinks  . Drug use: No  . Sexual activity: Not Currently  Lifestyle  . Physical activity    Days per week: 0 days    Minutes per session: 0 min  . Stress: To some extent  Relationships  . Social Musicianconnections    Talks on phone: Patient refused    Gets together: Patient refused    Attends religious service: Patient refused    Active member of club or organization: Patient refused    Attends meetings of clubs or organizations: Patient refused    Relationship status:  Widowed  . Intimate partner violence    Fear of current or ex partner: No    Emotionally abused: No    Physically abused: No    Forced sexual activity: No  Other Topics Concern  . Not on file  Social History Narrative   Lost husband 07/18/2017   Grown son still living with her      Current Outpatient Medications:  .  amiodarone (PACERONE) 200 MG tablet, Take 1 tablet by mouth daily., Disp: , Rfl:  .  amLODipine (NORVASC) 10 MG tablet, TAKE 1 TABLET(10 MG) BY MOUTH DAILY, Disp: 90 tablet, Rfl: 1 .  azelastine (ASTELIN) 0.1 % nasal spray, Place 2 sprays into both nostrils 2 (two) times daily. Use in each nostril as directed, Disp: 30 mL, Rfl: 12 .  calcium-vitamin D (OSCAL WITH D) 500-200 MG-UNIT tablet,  Take 1 tablet by mouth 2 (two) times daily., Disp: , Rfl:  .  carvedilol (COREG) 25 MG tablet, TAKE 1 TABLET(25 MG) BY MOUTH TWICE DAILY WITH A MEAL, Disp: 180 tablet, Rfl: 1 .  COENZYME Q10 PO, Take 30 mg by mouth daily., Disp: , Rfl:  .  ferrous gluconate (FERGON) 324 MG tablet, Take 324 mg by mouth daily with breakfast., Disp: , Rfl:  .  folic acid (FOLVITE) 400 MCG tablet, Take 1 tablet by mouth daily. Reported on 04/19/2015, Disp: , Rfl:  .  irbesartan (AVAPRO) 300 MG tablet, TAKE 1 TABLET BY MOUTH EVERY DAY FOR BLOOD PRESSURE, Disp: 90 tablet, Rfl: 1 .  omeprazole (PRILOSEC) 40 MG capsule, TAKE 1 CAPSULE(40 MG) BY MOUTH DAILY, Disp: 90 capsule, Rfl: 1 .  PRADAXA 150 MG CAPS capsule, Take 1 capsule (150 mg total) by mouth 2 (two) times daily., Disp: 180 capsule, Rfl: 1 .  rosuvastatin (CRESTOR) 10 MG tablet, Take 1 tablet (10 mg total) by mouth daily., Disp: 90 tablet, Rfl: 1 .  Vitamin D, Cholecalciferol, 1000 UNITS TABS, Take 1 tablet by mouth daily., Disp: , Rfl:  .  zolpidem (AMBIEN) 5 MG tablet, Take 1 tablet (5 mg total) by mouth at bedtime as needed for sleep., Disp: 90 tablet, Rfl: 0 .  diclofenac sodium (VOLTAREN) 1 % GEL, APPLY TO WRIST TWICE DAILY (Patient not taking:  Reported on 11/05/2018), Disp: 100 g, Rfl: 0 .  fluticasone (FLONASE) 50 MCG/ACT nasal spray, Place 2 sprays into both nostrils daily. (Patient not taking: Reported on 11/05/2018), Disp: 16 g, Rfl: 6  Current Facility-Administered Medications:  .  cyanocobalamin ((VITAMIN B-12)) injection 1,000 mcg, 1,000 mcg, Intramuscular, Q30 days, Alba CorySowles, Chavis Tessler, MD, 1,000 mcg at 04/23/18 1020  Allergies  Allergen Reactions  . Nickel Dermatitis and Swelling  . Other Anaphylaxis    Uncoded Allergy. Allergen: SHELLFISH  . Ace Inhibitors Cough    I personally reviewed active problem list, medication list, allergies, family history, social history with the patient/caregiver today.   ROS  Constitutional: Negative for fever or weight change.  Respiratory: Negative for cough and shortness of breath.   Cardiovascular: Negative for chest pain or palpitations.  Gastrointestinal: Negative for abdominal pain, no bowel changes.  Musculoskeletal: Negative for gait problem or joint swelling.  Skin: Negative for rash.  Neurological: Negative for dizziness or headache.  No other specific complaints in a complete review of systems (except as listed in HPI above).   Objective  Vitals:   11/05/18 1141  BP: 130/62  Pulse: (!) 58  Resp: 16  Temp: (!) 96.9 F (36.1 C)  TempSrc: Temporal  SpO2: 97%  Weight: 159 lb 6.4 oz (72.3 kg)  Height: 5\' 5"  (1.651 m)    Body mass index is 26.53 kg/m.  Physical Exam  Constitutional: Patient appears well-developed and well-nourished.No distress.  HEENT: head atraumatic, normocephalic, pupils equal and reactive to light,neck supple Cardiovascular: Normal rate, regular rhythm and normal heart sounds.  3/6  murmur heard louder on 2nd right intercostal space. non pitting  BLE edema. Pulmonary/Chest: Effort normal and breath sounds normal. No respiratory distress. Abdominal: Soft.  There is no tenderness. Psychiatric: Patient has a normal mood and affect. behavior is  normal. Judgment and thought content normal.  PHQ2/9: Depression screen [facility] 2/[facility] 11/05/2018 08/03/2018 04/23/2018 03/04/2018 01/12/2018  Decreased Interest 0 0 0 0 2  Down, Depressed, Hopeless 0 0 0 0 2  PHQ - 2 Score 0 0 0 0 4  Altered sleeping  2 0 3 0 2  Tired, decreased energy 1 0 1 0 2  Change in appetite 2 0 0 0 3  Feeling bad or failure about yourself  0 0 1 0 0  Trouble concentrating 0 0 0 0 0  Moving slowly or fidgety/restless 0 0 3 0 1  Suicidal thoughts 0 0 0 0 0  PHQ-9 Score 5 0 8 0 12  Difficult doing work/chores Somewhat difficult - Very difficult Not difficult at all Not difficult at all  Some recent data might be hidden    phq 9 is negative   Fall Risk: Fall Risk  11/05/2018 04/23/2018 03/04/2018 01/12/2018 01/12/2018  Falls in the past year? 0 0 0 No No  Number falls in past yr: 0 0 - - -  Injury with Fall? 0 0 0 - -  Risk for fall due to : - - - Impaired vision;Impaired balance/gait;Medication side effect;History of fall(s);Mental status change -  Risk for fall due to: Comment - - - ambualtes with cane, new pain to L extremity resulting in referral to ortho; wears eyeglasses; recent depression resulting in start of Zoloft -  Follow up - - - - -    Functional Status Survey: Is the patient deaf or have difficulty hearing?: Yes Does the patient have difficulty seeing, even when wearing glasses/contacts?: Yes Does the patient have difficulty concentrating, remembering, or making decisions?: No Does the patient have difficulty walking or climbing stairs?: Yes Does the patient have difficulty dressing or bathing?: No Does the patient have difficulty doing errands alone such as visiting a doctor's office or shopping?: No    Assessment & Plan   1. Thrombocytopenia (HCC)  - CBC with Differential/Platelet  2. B12 deficiency  - cyanocobalamin ((VITAMIN B-12)) injection 1,000 mcg  3. Paroxysmal A-fib (HCC)  Sinus rhythm during exam   4. Benign essential HTN  -  COMPLETE METABOLIC PANEL WITH GFR  5. Dyslipidemia   6. Major depression in remission ([place])   7. Hypertrophic cardiomyopathy ([place])  Under the care of cardiologist   8. Mesenteric vascular insufficiency (HCC)  Current no symptoms   9. Vitamin D deficiency  - VITAMIN D 25 Hydroxy (Vit-D Deficiency, Fractures)  10. Nocturia more than twice per night  - solifenacin (VESICARE) 5 MG tablet; Take 1 tablet (5 mg total) by mouth daily.  Dispense: 30 tablet; Refill: 2 - CULTURE, URINE COMPREHENSIVE

## 2018-11-07 LAB — VITAMIN D 25 HYDROXY (VIT D DEFICIENCY, FRACTURES): Vit D, 25-Hydroxy: 42 ng/mL (ref 30–100)

## 2018-11-07 LAB — CULTURE, URINE COMPREHENSIVE
MICRO NUMBER:: 703847
SPECIMEN QUALITY:: ADEQUATE

## 2018-11-07 LAB — CBC WITH DIFFERENTIAL/PLATELET
Absolute Monocytes: 578 cells/uL (ref 200–950)
Basophils Absolute: 28 cells/uL (ref 0–200)
Basophils Relative: 0.5 %
Eosinophils Absolute: 72 cells/uL (ref 15–500)
Eosinophils Relative: 1.3 %
HCT: 33.6 % — ABNORMAL LOW (ref 35.0–45.0)
Hemoglobin: 11 g/dL — ABNORMAL LOW (ref 11.7–15.5)
Lymphs Abs: 1419 cells/uL (ref 850–3900)
MCH: 30.8 pg (ref 27.0–33.0)
MCHC: 32.7 g/dL (ref 32.0–36.0)
MCV: 94.1 fL (ref 80.0–100.0)
MPV: 11.5 fL (ref 7.5–12.5)
Monocytes Relative: 10.5 %
Neutro Abs: 3405 cells/uL (ref 1500–7800)
Neutrophils Relative %: 61.9 %
Platelets: 141 10*3/uL (ref 140–400)
RBC: 3.57 10*6/uL — ABNORMAL LOW (ref 3.80–5.10)
RDW: 12.9 % (ref 11.0–15.0)
Total Lymphocyte: 25.8 %
WBC: 5.5 10*3/uL (ref 3.8–10.8)

## 2018-11-07 LAB — COMPLETE METABOLIC PANEL WITH GFR
AG Ratio: 1.3 (calc) (ref 1.0–2.5)
ALT: 15 U/L (ref 6–29)
AST: 23 U/L (ref 10–35)
Albumin: 3.9 g/dL (ref 3.6–5.1)
Alkaline phosphatase (APISO): 67 U/L (ref 37–153)
BUN/Creatinine Ratio: 18 (calc) (ref 6–22)
BUN: 19 mg/dL (ref 7–25)
CO2: 23 mmol/L (ref 20–32)
Calcium: 9.2 mg/dL (ref 8.6–10.4)
Chloride: 106 mmol/L (ref 98–110)
Creat: 1.03 mg/dL — ABNORMAL HIGH (ref 0.60–0.88)
GFR, Est African American: 58 mL/min/{1.73_m2} — ABNORMAL LOW (ref >=60)
GFR, Est Non African American: 50 mL/min/{1.73_m2} — ABNORMAL LOW (ref >=60)
Globulin: 3 g/dL (calc) (ref 1.9–3.7)
Glucose, Bld: 84 mg/dL (ref 65–99)
Potassium: 4.4 mmol/L (ref 3.5–5.3)
Sodium: 139 mmol/L (ref 135–146)
Total Bilirubin: 0.4 mg/dL (ref 0.2–1.2)
Total Protein: 6.9 g/dL (ref 6.1–8.1)

## 2018-11-10 ENCOUNTER — Other Ambulatory Visit: Payer: Self-pay | Admitting: Family Medicine

## 2018-11-10 DIAGNOSIS — I10 Essential (primary) hypertension: Secondary | ICD-10-CM

## 2018-11-30 ENCOUNTER — Emergency Department: Payer: Medicare Other

## 2018-11-30 ENCOUNTER — Inpatient Hospital Stay
Admit: 2018-11-30 | Discharge: 2018-11-30 | Disposition: A | Discharge status: Home / Self Care | Payer: Medicare Other | Attending: Nurse Practitioner | Admitting: Nurse Practitioner

## 2018-11-30 ENCOUNTER — Other Ambulatory Visit: Payer: Self-pay

## 2018-11-30 ENCOUNTER — Inpatient Hospital Stay
Admission: EM | Admit: 2018-11-30 | Discharge: 2018-12-02 | DRG: 291 | Disposition: A | Discharge status: Home / Self Care | Payer: Medicare Other | Attending: Internal Medicine | Admitting: Internal Medicine

## 2018-11-30 DIAGNOSIS — R7989 Other specified abnormal findings of blood chemistry: Secondary | ICD-10-CM | POA: Diagnosis not present

## 2018-11-30 DIAGNOSIS — Z8679 Personal history of other diseases of the circulatory system: Secondary | ICD-10-CM | POA: Diagnosis not present

## 2018-11-30 DIAGNOSIS — D638 Anemia in other chronic diseases classified elsewhere: Secondary | ICD-10-CM | POA: Diagnosis present

## 2018-11-30 DIAGNOSIS — I272 Pulmonary hypertension, unspecified: Secondary | ICD-10-CM | POA: Diagnosis present

## 2018-11-30 DIAGNOSIS — R0602 Shortness of breath: Secondary | ICD-10-CM

## 2018-11-30 DIAGNOSIS — Z79899 Other long term (current) drug therapy: Secondary | ICD-10-CM | POA: Diagnosis not present

## 2018-11-30 DIAGNOSIS — Z8249 Family history of ischemic heart disease and other diseases of the circulatory system: Secondary | ICD-10-CM

## 2018-11-30 DIAGNOSIS — E538 Deficiency of other specified B group vitamins: Secondary | ICD-10-CM | POA: Diagnosis present

## 2018-11-30 DIAGNOSIS — K219 Gastro-esophageal reflux disease without esophagitis: Secondary | ICD-10-CM | POA: Diagnosis present

## 2018-11-30 DIAGNOSIS — Z7951 Long term (current) use of inhaled steroids: Secondary | ICD-10-CM | POA: Diagnosis not present

## 2018-11-30 DIAGNOSIS — I5021 Acute systolic (congestive) heart failure: Secondary | ICD-10-CM | POA: Diagnosis not present

## 2018-11-30 DIAGNOSIS — Z20828 Contact with and (suspected) exposure to other viral communicable diseases: Secondary | ICD-10-CM | POA: Diagnosis not present

## 2018-11-30 DIAGNOSIS — K551 Chronic vascular disorders of intestine: Secondary | ICD-10-CM | POA: Diagnosis present

## 2018-11-30 DIAGNOSIS — I422 Other hypertrophic cardiomyopathy: Secondary | ICD-10-CM | POA: Diagnosis present

## 2018-11-30 DIAGNOSIS — J189 Pneumonia, unspecified organism: Secondary | ICD-10-CM | POA: Diagnosis present

## 2018-11-30 DIAGNOSIS — Z888 Allergy status to other drugs, medicaments and biological substances status: Secondary | ICD-10-CM

## 2018-11-30 DIAGNOSIS — R0689 Other abnormalities of breathing: Secondary | ICD-10-CM | POA: Diagnosis not present

## 2018-11-30 DIAGNOSIS — I248 Other forms of acute ischemic heart disease: Secondary | ICD-10-CM | POA: Diagnosis present

## 2018-11-30 DIAGNOSIS — Z7901 Long term (current) use of anticoagulants: Secondary | ICD-10-CM | POA: Diagnosis not present

## 2018-11-30 DIAGNOSIS — R069 Unspecified abnormalities of breathing: Secondary | ICD-10-CM | POA: Diagnosis not present

## 2018-11-30 DIAGNOSIS — I110 Hypertensive heart disease with heart failure: Principal | ICD-10-CM | POA: Diagnosis present

## 2018-11-30 DIAGNOSIS — J9601 Acute respiratory failure with hypoxia: Secondary | ICD-10-CM | POA: Diagnosis not present

## 2018-11-30 DIAGNOSIS — J3089 Other allergic rhinitis: Secondary | ICD-10-CM | POA: Diagnosis present

## 2018-11-30 DIAGNOSIS — R0902 Hypoxemia: Secondary | ICD-10-CM | POA: Diagnosis not present

## 2018-11-30 DIAGNOSIS — E785 Hyperlipidemia, unspecified: Secondary | ICD-10-CM | POA: Diagnosis present

## 2018-11-30 DIAGNOSIS — I480 Paroxysmal atrial fibrillation: Secondary | ICD-10-CM | POA: Diagnosis not present

## 2018-11-30 DIAGNOSIS — I5033 Acute on chronic diastolic (congestive) heart failure: Secondary | ICD-10-CM | POA: Diagnosis not present

## 2018-11-30 DIAGNOSIS — E876 Hypokalemia: Secondary | ICD-10-CM | POA: Diagnosis present

## 2018-11-30 DIAGNOSIS — G4700 Insomnia, unspecified: Secondary | ICD-10-CM | POA: Diagnosis present

## 2018-11-30 DIAGNOSIS — H918X3 Other specified hearing loss, bilateral: Secondary | ICD-10-CM | POA: Diagnosis present

## 2018-11-30 DIAGNOSIS — I10 Essential (primary) hypertension: Secondary | ICD-10-CM | POA: Diagnosis not present

## 2018-11-30 LAB — COMPREHENSIVE METABOLIC PANEL
ALT: 17 U/L (ref 0–44)
AST: 25 U/L (ref 15–41)
Albumin: 3.7 g/dL (ref 3.5–5.0)
Alkaline Phosphatase: 82 U/L (ref 38–126)
Anion gap: 7 (ref 5–15)
BUN: 17 mg/dL (ref 8–23)
CO2: 23 mmol/L (ref 22–32)
Calcium: 8.9 mg/dL (ref 8.9–10.3)
Chloride: 106 mmol/L (ref 98–111)
Creatinine, Ser: 0.99 mg/dL (ref 0.44–1.00)
GFR calc Af Amer: >60 mL/min (ref >60)
GFR calc non Af Amer: 53 mL/min — ABNORMAL LOW (ref >60)
Glucose, Bld: 188 mg/dL — ABNORMAL HIGH (ref 70–99)
Potassium: 3.4 mmol/L — ABNORMAL LOW (ref 3.5–5.1)
Sodium: 136 mmol/L (ref 135–145)
Total Bilirubin: 0.7 mg/dL (ref 0.3–1.2)
Total Protein: 7.4 g/dL (ref 6.5–8.1)

## 2018-11-30 LAB — BLOOD GAS, VENOUS
Acid-Base Excess: 1 mmol/L (ref 0.0–2.0)
Bicarbonate: 27.1 mmol/L (ref 20.0–28.0)
Delivery systems: POSITIVE
FIO2: 1
O2 Saturation: 85.6 %
Patient temperature: 37
pCO2, Ven: 48 mmHg (ref 44.0–60.0)
pH, Ven: 7.36 (ref 7.250–7.430)
pO2, Ven: 53 mmHg — ABNORMAL HIGH (ref 32.0–45.0)

## 2018-11-30 LAB — CBC WITH DIFFERENTIAL/PLATELET
Abs Immature Granulocytes: 0.05 10*3/uL (ref 0.00–0.07)
Basophils Absolute: 0 10*3/uL (ref 0.0–0.1)
Basophils Relative: 0 %
Eosinophils Absolute: 0.1 10*3/uL (ref 0.0–0.5)
Eosinophils Relative: 1 %
HCT: 38.1 % (ref 36.0–46.0)
Hemoglobin: 11.8 g/dL — ABNORMAL LOW (ref 12.0–15.0)
Immature Granulocytes: 1 %
Lymphocytes Relative: 20 %
Lymphs Abs: 1.7 10*3/uL (ref 0.7–4.0)
MCH: 30.1 pg (ref 26.0–34.0)
MCHC: 31 g/dL (ref 30.0–36.0)
MCV: 97.2 fL (ref 80.0–100.0)
Monocytes Absolute: 0.5 10*3/uL (ref 0.1–1.0)
Monocytes Relative: 6 %
Neutro Abs: 6.3 10*3/uL (ref 1.7–7.7)
Neutrophils Relative %: 72 %
Platelets: 125 10*3/uL — ABNORMAL LOW (ref 150–400)
RBC: 3.92 MIL/uL (ref 3.87–5.11)
RDW: 14.2 % (ref 11.5–15.5)
WBC: 8.6 10*3/uL (ref 4.0–10.5)
nRBC: 0 % (ref 0.0–0.2)

## 2018-11-30 LAB — LIPASE, BLOOD: Lipase: 30 U/L (ref 11–51)

## 2018-11-30 LAB — URINALYSIS, COMPLETE (UACMP) WITH MICROSCOPIC
Bacteria, UA: NONE SEEN
Bilirubin Urine: NEGATIVE
Glucose, UA: NEGATIVE mg/dL
Hgb urine dipstick: NEGATIVE
Ketones, ur: NEGATIVE mg/dL
Leukocytes,Ua: NEGATIVE
Nitrite: NEGATIVE
Protein, ur: NEGATIVE mg/dL
Specific Gravity, Urine: 1.012 (ref 1.005–1.030)
pH: 6 (ref 5.0–8.0)

## 2018-11-30 LAB — LACTIC ACID, PLASMA
Lactic Acid, Venous: 1.5 mmol/L (ref 0.5–1.9)
Lactic Acid, Venous: 1.2 mmol/L (ref 0.5–1.9)

## 2018-11-30 LAB — SARS CORONAVIRUS 2 BY RT PCR (HOSPITAL ORDER, PERFORMED IN ~~LOC~~ HOSPITAL LAB): SARS Coronavirus 2: NEGATIVE

## 2018-11-30 LAB — TROPONIN I (HIGH SENSITIVITY)
Troponin I (High Sensitivity): 32 ng/L — ABNORMAL HIGH (ref <18)
Troponin I (High Sensitivity): 43 ng/L — ABNORMAL HIGH (ref <18)

## 2018-11-30 LAB — BRAIN NATRIURETIC PEPTIDE: B Natriuretic Peptide: 411 pg/mL — ABNORMAL HIGH (ref 0.0–100.0)

## 2018-11-30 MED ORDER — AZELASTINE HCL 0.1 % NA SOLN
2.0000 | Freq: Two times a day (BID) | NASAL | Status: DC
  Filled 2018-11-30: qty 30

## 2018-11-30 MED ORDER — CALCIUM CARBONATE-VITAMIN D 500-200 MG-UNIT PO TABS
1.0000 | ORAL_TABLET | Freq: Two times a day (BID) | ORAL | Status: DC
  Administered 2018-11-30 – 2018-12-02 (×4): 1 via ORAL
  Filled 2018-11-30 (×4): qty 1

## 2018-11-30 MED ORDER — SODIUM CHLORIDE 0.9% FLUSH
3.0000 mL | Freq: Two times a day (BID) | INTRAVENOUS | Status: DC
  Administered 2018-11-30 – 2018-12-02 (×4): 3 mL via INTRAVENOUS

## 2018-11-30 MED ORDER — ACETAMINOPHEN 325 MG PO TABS
650.0000 mg | ORAL_TABLET | ORAL | Status: DC | PRN
  Administered 2018-12-02: 650 mg via ORAL
  Filled 2018-11-30: qty 2

## 2018-11-30 MED ORDER — SODIUM CHLORIDE 0.9 % IV SOLN: 250.0000 mL | INTRAVENOUS | Status: DC | PRN

## 2018-11-30 MED ORDER — DARIFENACIN HYDROBROMIDE ER 7.5 MG PO TB24
7.5000 mg | ORAL_TABLET | Freq: Every day | ORAL | Status: DC
  Administered 2018-11-30 – 2018-12-02 (×3): 7.5 mg via ORAL
  Filled 2018-11-30 (×3): qty 1

## 2018-11-30 MED ORDER — AMIODARONE HCL 200 MG PO TABS
200.0000 mg | ORAL_TABLET | Freq: Every day | ORAL | Status: DC
  Administered 2018-11-30 – 2018-12-02 (×3): 200 mg via ORAL
  Filled 2018-11-30 (×3): qty 1

## 2018-11-30 MED ORDER — VITAMIN D3 25 MCG (1000 UNIT) PO TABS
1000.0000 [IU] | ORAL_TABLET | Freq: Every day | ORAL | Status: DC
  Administered 2018-11-30 – 2018-12-02 (×3): 1000 [IU] via ORAL
  Filled 2018-11-30 (×6): qty 1

## 2018-11-30 MED ORDER — CARVEDILOL 25 MG PO TABS
25.0000 mg | ORAL_TABLET | Freq: Two times a day (BID) | ORAL | Status: DC
  Administered 2018-11-30 – 2018-12-02 (×4): 25 mg via ORAL
  Filled 2018-11-30 (×4): qty 1

## 2018-11-30 MED ORDER — ONDANSETRON HCL 4 MG/2ML IJ SOLN: 4.0000 mg | Freq: Four times a day (QID) | INTRAMUSCULAR | Status: DC | PRN

## 2018-11-30 MED ORDER — DABIGATRAN ETEXILATE MESYLATE 150 MG PO CAPS
150.0000 mg | ORAL_CAPSULE | Freq: Two times a day (BID) | ORAL | Status: DC
  Administered 2018-11-30 – 2018-12-02 (×4): 150 mg via ORAL
  Filled 2018-11-30 (×6): qty 1

## 2018-11-30 MED ORDER — FUROSEMIDE 10 MG/ML IJ SOLN: 40.0000 mg | Freq: Two times a day (BID) | INTRAMUSCULAR | Status: DC

## 2018-11-30 MED ORDER — ROSUVASTATIN CALCIUM 10 MG PO TABS
10.0000 mg | ORAL_TABLET | Freq: Every day | ORAL | Status: DC
  Administered 2018-11-30 – 2018-12-02 (×3): 10 mg via ORAL
  Filled 2018-11-30 (×3): qty 1

## 2018-11-30 MED ORDER — CYANOCOBALAMIN 1000 MCG/ML IJ SOLN: 1000.0000 ug | INTRAMUSCULAR | Status: DC

## 2018-11-30 MED ORDER — FOLIC ACID 1 MG PO TABS
500.0000 ug | ORAL_TABLET | Freq: Every day | ORAL | Status: DC
  Administered 2018-11-30 – 2018-12-02 (×3): 0.5 mg via ORAL
  Filled 2018-11-30 (×3): qty 1

## 2018-11-30 MED ORDER — SODIUM CHLORIDE 0.9% FLUSH: 3.0000 mL | INTRAVENOUS | Status: DC | PRN

## 2018-11-30 MED ORDER — FERROUS GLUCONATE 324 (38 FE) MG PO TABS
324.0000 mg | ORAL_TABLET | Freq: Every day | ORAL | Status: DC
  Administered 2018-12-01 – 2018-12-02 (×2): 324 mg via ORAL
  Filled 2018-11-30 (×2): qty 1

## 2018-11-30 MED ORDER — PANTOPRAZOLE SODIUM 40 MG PO TBEC
40.0000 mg | DELAYED_RELEASE_TABLET | Freq: Every day | ORAL | Status: DC
  Administered 2018-11-30 – 2018-12-02 (×3): 40 mg via ORAL
  Filled 2018-11-30 (×3): qty 1

## 2018-11-30 MED ORDER — COENZYME Q10 30 MG PO CAPS: 30.0000 mg | ORAL_CAPSULE | Freq: Every day | ORAL | Status: DC

## 2018-11-30 MED ORDER — AMLODIPINE BESYLATE 5 MG PO TABS
10.0000 mg | ORAL_TABLET | Freq: Every day | ORAL | Status: DC
  Administered 2018-11-30 – 2018-12-02 (×3): 10 mg via ORAL
  Filled 2018-11-30 (×3): qty 2

## 2018-11-30 MED ORDER — IRBESARTAN 150 MG PO TABS
300.0000 mg | ORAL_TABLET | Freq: Every day | ORAL | Status: DC
  Administered 2018-11-30 – 2018-12-02 (×3): 300 mg via ORAL
  Filled 2018-11-30 (×3): qty 2

## 2018-11-30 MED ORDER — FUROSEMIDE 10 MG/ML IJ SOLN
20.0000 mg | Freq: Once | INTRAMUSCULAR | Status: AC
  Administered 2018-11-30: 20 mg via INTRAVENOUS
  Filled 2018-11-30: qty 4

## 2018-11-30 MED ORDER — FUROSEMIDE 10 MG/ML IJ SOLN
20.0000 mg | Freq: Two times a day (BID) | INTRAMUSCULAR | Status: DC
  Administered 2018-12-01 – 2018-12-02 (×3): 20 mg via INTRAVENOUS
  Filled 2018-11-30 (×3): qty 2

## 2018-11-30 NOTE — ED Notes (Signed)
Attempted report to ICU; told to request change in level of care. Messaged provider as pt maintaining sats at 95% on 2L [place].

## 2018-11-30 NOTE — ED Notes (Signed)
Pt briefly updated. 

## 2018-11-30 NOTE — Progress Notes (Signed)
Pt was removed from bipap and placed on 2L nasal cannula. Sa02 is 95%. Pt states she is breathing fine.

## 2018-11-30 NOTE — ED Notes (Signed)
Pt being transported to rm 250 at this time via stretcher.

## 2018-11-30 NOTE — ED Notes (Signed)
ED TO INPATIENT HANDOFF REPORT  ED Nurse Name and Phone #: Metta Clines [phone]  S Name/Age/Gender Sharon Bullock 85 y.o. female Room/Bed: ED11A/ED11A  Code Status   Code Status: Not on file  Home/SNF/Other Home Patient oriented to: self, place, time and situation Is this baseline? Yes   Triage Complete: Triage complete  Chief Complaint sob  Triage Note Pt arrives to ED via [facility] from home. Arrives on cpap. Placed on bipap upon arrival. Pt was in 70's on room air. On cpap pt came up to 81%. Upon arrival is 86% on cpap. Upon placement of bipap pt came up to 96%. Pt appears fatigued. Pt legs are swollen. Hx CHF. A&O when asked questions. No fever. EMS reports symptoms began yesterday.    Allergies Allergies  Allergen Reactions  . Nickel Dermatitis and Swelling  . Other Anaphylaxis    Uncoded Allergy. Allergen: SHELLFISH  . Ace Inhibitors Cough    Level of Care/Admitting Diagnosis ED Disposition    ED Disposition Condition Comment   Admit  The patient appears reasonably stabilized for admission considering the current resources, flow, and capabilities available in the ED at this time, and I doubt any other [facility] requiring further screening and/or treatment in the ED prior to admission is  present.       B Medical/Surgery History Past Medical History:  Diagnosis Date  . Allergy   . Enlarged heart 2000  . GERD (gastroesophageal reflux disease)   . Hypertension   . Insomnia   . Vertigo 2006   Past Surgical History:  Procedure Laterality Date  . BLADDER REPAIR  2014   tact     A IV Location/Drains/Wounds Patient Lines/Drains/Airways Status   Active Line/Drains/Airways    Name:   Placement date:   Placement time:   Site:   Days:   Peripheral IV 11/30/18 Left Hand   11/30/18    0735    Hand   less than 1   Peripheral IV 11/30/18 Left Antecubital   11/30/18    0735    Antecubital   less than 1   Peripheral IV 11/30/18 Right Hand   11/30/18    0735    Hand   less than  1   External Urinary Catheter   11/30/18    0741    -   less than 1          Intake/Output Last 24 hours  Intake/Output Summary (Last 24 hours) at 11/30/2018 1108 Last data filed at 11/30/2018 1055 Gross per 24 hour  Intake -  Output 850 ml  Net -850 ml    Labs/Imaging Results for orders placed or performed during the hospital encounter of 11/30/18 (from the past 48 hour(s))  Blood gas, venous     Status: Abnormal   Collection Time: 11/30/18  7:23 AM  Result Value Ref Range   FIO2 1.00    Delivery systems BILEVEL POSITIVE AIRWAY PRESSURE    pH, Ven 7.36 7.250 - 7.430   pCO2, Ven 48 44.0 - 60.0 mmHg   pO2, Ven 53.0 (H) 32.0 - 45.0 mmHg   Bicarbonate 27.1 20.0 - 28.0 mmol/L   Acid-Base Excess 1.0 0.0 - 2.0 mmol/L   O2 Saturation 85.6 %   Patient temperature 37.0    Collection site VEIN    Sample type VENOUS     Comment: Performed at [facility], [address], [place], [place] [address]  Brain natriuretic peptide     Status: Abnormal   Collection  Time: 11/30/18  7:27 AM  Result Value Ref Range   B Natriuretic Peptide 411.0 (H) 0.0 - 100.0 pg/mL    Comment: Performed at [facility], [address] [place], [place] [address]  Lactic acid, plasma     Status: None   Collection Time: 11/30/18  7:27 AM  Result Value Ref Range   Lactic Acid, Venous 1.5 0.5 - 1.9 mmol/L    Comment: Performed at [facility], [address] [place], [place] [address]  Urinalysis, Complete w Microscopic     Status: Abnormal   Collection Time: 11/30/18  7:27 AM  Result Value Ref Range   Color, Urine YELLOW (A) YELLOW   APPearance HAZY (A) CLEAR   Specific Gravity, Urine 1.012 1.005 - 1.030   pH 6.0 5.0 - 8.0   Glucose, UA NEGATIVE NEGATIVE mg/dL   Hgb urine dipstick NEGATIVE NEGATIVE   Bilirubin Urine NEGATIVE NEGATIVE   Ketones, ur NEGATIVE NEGATIVE mg/dL   Protein, ur NEGATIVE NEGATIVE mg/dL   Nitrite NEGATIVE NEGATIVE   Leukocytes,Ua NEGATIVE NEGATIVE    RBC / HPF 0-5 0 - 5 RBC/hpf   WBC, UA 0-5 0 - 5 WBC/hpf   Bacteria, UA NONE SEEN NONE SEEN   Squamous Epithelial / LPF 0-5 0 - 5   Mucus PRESENT    Hyaline Casts, UA PRESENT     Comment: Performed at [facility], [address], [place], [place] [address]  SARS Coronavirus 2 [facility] order, Performed in [facility] hospital lab) Nasopharyngeal Nasopharyngeal Swab     Status: None   Collection Time: 11/30/18  7:27 AM   Specimen: Nasopharyngeal Swab  Result Value Ref Range   SARS Coronavirus 2 NEGATIVE NEGATIVE    Comment: (NOTE) If result is NEGATIVE SARS-CoV-2 target nucleic acids are NOT DETECTED. The SARS-CoV-2 RNA is generally detectable in upper and lower  respiratory specimens during the acute phase of infection. The lowest  concentration of SARS-CoV-2 viral copies this assay can detect is 250  copies / mL. A negative result does not preclude SARS-CoV-2 infection  and should not be used as the sole basis for treatment or other  patient management decisions.  A negative result may occur with  improper specimen collection / handling, submission of specimen other  than nasopharyngeal swab, presence of viral mutation(s) within the  areas targeted by this assay, and inadequate number of viral copies  (<250 copies / mL). A negative result must be combined with clinical  observations, patient history, and epidemiological information. If result is POSITIVE SARS-CoV-2 target nucleic acids are DETECTED. The SARS-CoV-2 RNA is generally detectable in upper and lower  respiratory specimens dur ing the acute phase of infection.  Positive  results are indicative of active infection with SARS-CoV-2.  Clinical  correlation with patient history and other diagnostic information is  necessary to determine patient infection status.  Positive results do  not rule out bacterial infection or co-infection with other viruses. If result is PRESUMPTIVE POSTIVE SARS-CoV-2 nucleic acids MAY BE  PRESENT.   A presumptive positive result was obtained on the submitted specimen  and confirmed on repeat testing.  While 2019 novel coronavirus  (SARS-CoV-2) nucleic acids may be present in the submitted sample  additional confirmatory testing may be necessary for epidemiological  and / or clinical management purposes  to differentiate between  SARS-CoV-2 and other Sarbecovirus currently known to infect humans.  If clinically indicated additional testing with an alternate test  methodology [phone]) is advised. The  SARS-CoV-2 RNA is generally  detectable in upper and lower respiratory sp ecimens during the acute  phase of infection. The expected result is Negative. Fact Sheet for Patients:  BoilerBrush.com.cyhttps://www.fda.gov/media/136312/download Fact Sheet for Healthcare Providers: https://pope.com/https://www.fda.gov/media/136313/download This test is not yet approved or cleared by the [place] FDA and has been authorized for detection and/or diagnosis of SARS-CoV-2 by FDA under an Emergency Use Authorization (EUA).  This EUA will remain in effect (meaning this test can be used) for the duration of the COVID-19 declaration under Section 564(b)(1) of the Act, 21 U.S.C. section 360bbb-3(b)(1), unless the authorization is terminated or revoked sooner. Performed at [facility], [address] [place], [place] [address]   CBC with Differential     Status: Abnormal   Collection Time: 11/30/18  7:28 AM  Result Value Ref Range   WBC 8.6 4.0 - 10.5 K/uL   RBC 3.92 3.87 - 5.11 MIL/uL   Hemoglobin 11.8 (L) 12.0 - 15.0 g/dL   HCT 19.138.1 47.836.0 - 29.546.0 %   MCV 97.2 80.0 - 100.0 fL   MCH 30.1 26.0 - 34.0 pg   MCHC 31.0 30.0 - 36.0 g/dL   RDW 62.114.2 30.811.5 - 65.715.5 %   Platelets 125 (L) 150 - 400 K/uL   nRBC 0.0 0.0 - 0.2 %   Neutrophils Relative % 72 %   Neutro Abs 6.3 1.7 - 7.7 K/uL   Lymphocytes Relative 20 %   Lymphs Abs 1.7 0.7 - 4.0 K/uL   Monocytes Relative 6 %   Monocytes Absolute 0.5 0.1 - 1.0 K/uL    Eosinophils Relative 1 %   Eosinophils Absolute 0.1 0.0 - 0.5 K/uL   Basophils Relative 0 %   Basophils Absolute 0.0 0.0 - 0.1 K/uL   Immature Granulocytes 1 %   Abs Immature Granulocytes 0.05 0.00 - 0.07 K/uL    Comment: Performed at [facility], [address] [place], [place] [address]  Troponin I (High Sensitivity)     Status: Abnormal   Collection Time: 11/30/18  7:28 AM  Result Value Ref Range   Troponin I (High Sensitivity) 32 (H) <18 ng/L    Comment: (NOTE) Elevated high sensitivity troponin I (hsTnI) values and significant  changes across serial measurements may suggest ACS but many other  chronic and acute conditions are known to elevate hsTnI results.  Refer to the "Links" section for chest pain algorithms and additional  guidance. Performed at [facility], [address] [place], [place] [address]   Lipase, blood     Status: None   Collection Time: 11/30/18  7:28 AM  Result Value Ref Range   Lipase 30 11 - 51 U/L    Comment: Performed at [facility], [address] [place], [place] [address]  Comprehensive metabolic panel     Status: Abnormal   Collection Time: 11/30/18  7:28 AM  Result Value Ref Range   Sodium 136 135 - 145 mmol/L   Potassium 3.4 (L) 3.5 - 5.1 mmol/L   Chloride 106 98 - 111 mmol/L   CO2 23 22 - 32 mmol/L   Glucose, Bld 188 (H) 70 - 99 mg/dL   BUN 17 8 - 23 mg/dL   Creatinine, Ser 4.010.99 0.44 - 1.00 mg/dL   Calcium 8.9 8.9 - 02.710.3 mg/dL   Total Protein 7.4 6.5 - 8.1 g/dL   Albumin 3.7 3.5 - 5.0 g/dL   AST 25 15 - 41 U/L   ALT 17 0 - 44 U/L  Alkaline Phosphatase 82 38 - 126 U/L   Total Bilirubin 0.7 0.3 - 1.2 mg/dL   GFR calc non Af Amer 53 (L) >60 mL/min   GFR calc Af Amer >60 >60 mL/min   Anion gap 7 5 - 15    Comment: Performed at [facility], [address] [place], [place] [address]  Lactic acid, plasma     Status: None   Collection Time: 11/30/18  9:53 AM  Result Value Ref Range   Lactic  Acid, Venous 1.2 0.5 - 1.9 mmol/L    Comment: Performed at [facility], [address] [place], [place] [address]  Troponin I (High Sensitivity)     Status: Abnormal   Collection Time: 11/30/18  9:53 AM  Result Value Ref Range   Troponin I (High Sensitivity) 43 (H) <18 ng/L    Comment: (NOTE) Elevated high sensitivity troponin I (hsTnI) values and significant  changes across serial measurements may suggest ACS but many other  chronic and acute conditions are known to elevate hsTnI results.  Refer to the "Links" section for chest pain algorithms and additional  guidance. Performed at [facility], [address] [place], [place] [address]    Dg Chest Port 1 View  Result Date: 11/30/2018 CLINICAL DATA:  Shortness of breath EXAM: PORTABLE CHEST 1 VIEW COMPARISON:  May 26, 2016 FINDINGS: There is cardiomegaly with pulmonary venous hypertension. There is interstitial edema. There is mild patchy alveolar opacity in the mid and lower lung zones as well. No frank consolidation. There is aortic atherosclerosis. No adenopathy. No bone lesions. IMPRESSION: Pulmonary vascular congestion with interstitial and patchy alveolar edema. These are findings felt to be indicative of congestive heart failure. A degree of superimposed pneumonia in the mid lower lung zones cannot be entirely excluded. Aortic Atherosclerosis (ICD10-I70.0). Electronically Signed   By: Bretta BangWilliam  Woodruff III M.D.   On: 11/30/2018 07:52    Pending Labs Unresulted Labs (From admission, onward)    Start     Ordered   11/30/18 0723  Culture, blood (routine x 2)  BLOOD CULTURE X 2,   STAT     11/30/18 0723   11/30/18 0723  Urine culture  Once,   STAT     11/30/18 0723          Vitals/Pain Today's Vitals   11/30/18 0900 11/30/18 0930 11/30/18 1001 11/30/18 1030  BP: (!) 165/76 (!) 160/82 (!) 170/87 (!) 157/67  Pulse: 65 60 62 (!) 59  Resp:      Temp:      TempSrc:      SpO2: 100% 100% 100% 100%   Weight:      Height:      PainSc:        Isolation Precautions No active isolations  Medications Medications  furosemide (LASIX) injection 20 mg (20 mg Intravenous Given 11/30/18 0954)    Mobility walks with device Moderate fall risk   Focused Assessments Cardiac Assessment Handoff:  Cardiac Rhythm: Normal sinus rhythm No results found for: CKTOTAL, CKMB, CKMBINDEX, TROPONINI No results found for: DDIMER Does the Patient currently have chest pain? No      R Recommendations: See Admitting Provider Note  Report given to:   Additional Notes:  2 hand IVs; 1 ac IV; family member at bedside; covid NEGATIVE; mild edema in legs; uses CANE as needed at home; NOT on oxygen at home; currently on CPAP; Heart Failure history: Trop x2 ELEVATED.

## 2018-11-30 NOTE — H&P (Signed)
[facility] [place] [facility]   PATIENT NAME: Sharon Bullock    MR#:  [id number]  DATE OF BIRTH:  05-Aug-1933  DATE OF ADMISSION:  11/30/2018  PRIMARY CARE PHYSICIAN: Alba CorySowles, Krichna, MD   REQUESTING/REFERRING PHYSICIAN: Ileana RoupMcShane, James, MD  CHIEF COMPLAINT:   Chief Complaint  Patient presents with   Shortness of Breath    HISTORY OF PRESENT ILLNESS:   85 year old female with past medical history of HOCM EF of 55% with severe LVH, paroxysmal atrial fibrillation on Pradaxa, GERD, hypertension, thrombocytopenia, hyperlipidemia, and chronic anemia presenting to the ED with worsening shortness of breath.   Patient is currently on BiPAP on my exam so history mostly obtained from ED chart and patient's daughter who is currently at the bedside.  Per patient's daughter, patient lives with her disabled son and she is the primary caregiver.  Apparently patient has been complaining of shortness of breath for the past 2 days with worsening symptoms this morning at 4 AM.  She denies fevers or chills, nausea or vomiting, chest pain or recent sick contacts.  Per patient's daughter, patient's son called EMS due to worsening symptoms.  When EMS arrived patient was found to be hypoxic in the 70s therefore was placed on 2 L of nasal cannula.  On arrival to the ED, he was afebrile with blood pressure 224/114 mm Hg and pulse rate 81 beats/min. There were no focal neurological deficits; she was alert but appeared to be in respiratory distress therefore was placed on BiPAP with improvement in her oxygen saturation noted.  Initial labs revealed elevated BNP of 411, platelets 125 otherwise unremarkable CBC, initial troponin 38, repeat 43, COVID-19 negative, potassium 3.4 and lactic acid 1.5.  UA negative for UTI.  Chest x-ray shows pulmonary vascular congestion with interstitial and patchy alveolar edema.  Given history of hypertrophic cardiomyopathy, small dose of Lasix 20 mg was given in the  ED after consulting with cardiology Dr. Juliann Paresallwood.  Patient will be admitted to the hospitalist service for further management.  PAST MEDICAL HISTORY:   Past Medical History:  Diagnosis Date   Allergy    Enlarged heart 2000   GERD (gastroesophageal reflux disease)    Hypertension    Insomnia    Vertigo 2006    PAST SURGICAL HISTORY:   Past Surgical History:  Procedure Laterality Date   BLADDER REPAIR  2014   tact    SOCIAL HISTORY:   Social History   Tobacco Use   Smoking status: Never Smoker   Smokeless tobacco: Never Used   Tobacco comment: smoking cessation materials not required  Substance Use Topics   Alcohol use: No    Alcohol/week: 0.0 standard drinks    FAMILY HISTORY:   Family History  Problem Relation Age of Onset   Alzheimer's disease Mother    Hypertension Mother    Stroke Father    Heart disease Father    Heart disease Brother    Hypertension Brother    Cancer Daughter        breast   Hypertension Daughter    Breast cancer Daughter    Diabetes Brother    Heart disease Brother    Heart disease Brother    Diabetes Brother    Hypertension Brother    Heart disease Brother    Hypertension Brother    Cancer Brother        prostate   Diabetes Brother    Heart disease Brother    Hypertension Brother  Cancer Brother        prostate    DRUG ALLERGIES:   Allergies  Allergen Reactions   Nickel Dermatitis and Swelling   Other Anaphylaxis    Uncoded Allergy. Allergen: SHELLFISH   Ace Inhibitors Cough    REVIEW OF SYSTEMS:   ROS Unable to obtain from patient due to being on BiPAP MEDICATIONS AT HOME:   Prior to Admission medications   Medication Sig Start Date End Date Taking? Authorizing Provider  amiodarone (PACERONE) 200 MG tablet Take 1 tablet by mouth daily. 07/13/14  Yes [provider]  amLODipine (NORVASC) 10 MG tablet Take 1 tablet (10 mg total) by mouth daily. 11/05/18  Yes Sowles,  Drue Stager, MD  azelastine (ASTELIN) 0.1 % nasal spray Place 2 sprays into both nostrils 2 (two) times daily. Use in each nostril as directed 05/26/16  Yes Sowles, Drue Stager, MD  calcium-vitamin D (OSCAL WITH D) 500-200 MG-UNIT tablet Take 1 tablet by mouth 2 (two) times daily.   Yes [provider]  carvedilol (COREG) 25 MG tablet TAKE 1 TABLET(25 MG) BY MOUTH TWICE DAILY WITH A MEAL Patient taking differently: Take 25 mg by mouth 2 (two) times daily with a meal.  11/05/18  Yes Sowles, Drue Stager, MD  co-enzyme Q-10 30 MG capsule Take 30 mg by mouth daily.    Yes [provider]  ferrous gluconate (FERGON) 324 MG tablet Take 324 mg by mouth daily with breakfast.   Yes [provider]  folic acid (FOLVITE) 967 MCG tablet Take 400 mcg by mouth daily.    Yes [provider]  irbesartan (AVAPRO) 300 MG tablet TAKE 1 TABLET BY MOUTH EVERY DAY FOR BLOOD PRESSURE Patient taking differently: Take 300 mg by mouth daily.  08/03/18  Yes Sowles, Drue Stager, MD  omeprazole (PRILOSEC) 40 MG capsule Take 1 capsule (40 mg total) by mouth daily. 11/05/18  Yes Sowles, Drue Stager, MD  PRADAXA 150 MG CAPS capsule Take 1 capsule (150 mg total) by mouth 2 (two) times daily. 08/03/18  Yes Sowles, Drue Stager, MD  rosuvastatin (CRESTOR) 10 MG tablet Take 1 tablet (10 mg total) by mouth daily. 08/03/18  Yes Sowles, Drue Stager, MD  solifenacin (VESICARE) 5 MG tablet Take 1 tablet (5 mg total) by mouth daily. 11/05/18  Yes Sowles, Drue Stager, MD  Vitamin D, Cholecalciferol, 1000 UNITS TABS Take 1,000 Units by mouth daily.    Yes [provider]  zolpidem (AMBIEN) 5 MG tablet Take 1 tablet (5 mg total) by mouth at bedtime as needed for sleep. 11/05/18  Yes Sowles, Drue Stager, MD  diclofenac sodium (VOLTAREN) 1 % GEL APPLY TO WRIST TWICE DAILY Patient not taking: Reported on 11/05/2018 01/12/18   Steele Sizer, MD  fluticasone (FLONASE) 50 MCG/ACT nasal spray Place 2 sprays into both nostrils daily. Patient not  taking: Reported on 11/05/2018 12/18/16   Steele Sizer, MD      VITAL SIGNS:  Blood pressure (!) 145/67, pulse 66, temperature 97.6 F (36.4 C), temperature source Axillary, resp. rate 14, height 5\' 3"  (1.6 m), weight 74.4 kg, SpO2 95 %.  PHYSICAL EXAMINATION:   Physical Exam  GENERAL:  85 y.o.-year-old patient lying in the bed with no acute distress.  EYES: Pupils equal, round, reactive to light and accommodation. No scleral icterus. Extraocular muscles intact.  HEENT: Head atraumatic, normocephalic. Oropharynx and nasopharynx clear.  NECK:  Supple, no jugular venous distention. No thyroid enlargement, no tenderness.  LUNGS: Decreased breath sounds bilaterally, mild wheezing, rales,rhonchi or crepitation. No use of accessory  muscles of respiration.  CARDIOVASCULAR: S1, S2 normal. No murmurs, rubs, or gallops. Irregular heart rythm ABDOMEN: Soft, nontender, nondistended. Bowel sounds present. No organomegaly or mass.  EXTREMITIES: Bilateral mild pitting edema of lower extremities, cyanosis, or clubbing. No rash or lesions. + pedal pulses MUSCULOSKELETAL: Normal bulk, and power was 5+ grip and elbow, knee, and ankle flexion and extension bilaterally.  NEUROLOGIC:Alert and oriented x 3. CN 2-12 intact. Sensation to light touch and cold stimuli intact bilaterally. Gait not tested due to safety concern. PSYCHIATRIC: The patient is alert and oriented x 3.  SKIN: No obvious rash, lesion, or ulcer.   DATA REVIEWED:  LABORATORY PANEL:   CBC Recent Labs  Lab 11/30/18 0728  WBC 8.6  HGB 11.8*  HCT 38.1  PLT 125*   ------------------------------------------------------------------------------------------------------------------  Chemistries  Recent Labs  Lab 11/30/18 0728  NA 136  K 3.4*  CL 106  CO2 23  GLUCOSE 188*  BUN 17  CREATININE 0.99  CALCIUM 8.9  AST 25  ALT 17  ALKPHOS 82  BILITOT 0.7    ------------------------------------------------------------------------------------------------------------------  Cardiac Enzymes No results for input(s): TROPONINI in the last 168 hours. ------------------------------------------------------------------------------------------------------------------  RADIOLOGY:  Dg Chest Port 1 View  Result Date: 11/30/2018 CLINICAL DATA:  Shortness of breath EXAM: PORTABLE CHEST 1 VIEW COMPARISON:  May 26, 2016 FINDINGS: There is cardiomegaly with pulmonary venous hypertension. There is interstitial edema. There is mild patchy alveolar opacity in the mid and lower lung zones as well. No frank consolidation. There is aortic atherosclerosis. No adenopathy. No bone lesions. IMPRESSION: Pulmonary vascular congestion with interstitial and patchy alveolar edema. These are findings felt to be indicative of congestive heart failure. A degree of superimposed pneumonia in the mid lower lung zones cannot be entirely excluded. Aortic Atherosclerosis (ICD10-I70.0). Electronically Signed   By: Bretta Bang III M.D.   On: 11/30/2018 07:52   EKG:  EKG: normal EKG, normal sinus rhythm, unchanged from previous tracings. Vent. rate 65 BPM PR interval * ms QRS duration 125 ms QT/QTc 477/496 ms P-R-T axes 40 37 80 IMPRESSION AND PLAN:   85 y.o. female with past medical history of HOCM EF of 55% with severe LVH, paroxysmal atrial fibrillation on Pradaxa, GERD, hypertension, thrombocytopenia, hyperlipidemia, and chronic anemia presenting to the ED with worsening shortness of breath.   1.  Acute respiratory failure with hypoxia -likely secondary to CHF exacerbation - Patient initially on BiPAP with significant improvement noted, she has been transitioned to oxygen via nasal cannula with current sats above 95% - Admit to telemetry unit - Continue supplemental oxygen and wean as tolerated  2. Hypertrophic cardiomyopathy: Acute presentation likely due to volume  overload with associated symptoms of SOB, BLE edema, BNP elevated at 411 - Chest x-ray shows pulmonary vascular congestion  - No recent echo cardiogram available, patient sees [facility]. Modesto Charon at [facility] cardiology who had scheduled a Q for September - Will obtain echocardiogram - Continue Coreg and Irbesartan - Hold diuretics given history of hypertrophic cardiomyopathy - Low salt diet  - Check daily weight - Strict I&Os - CHF Teaching - Cardiology Consult   3. Paroxysmal atrial fibrillation -continue anticoagulation with Pradaxa - Continue amiodarone for rate control  4. Elevated troponin -likely due to demand ischemia -Trend troponin  5. HTN  + Goal BP <130/80 - Continue Norvasc, Coreg and Irbesartan  6. HLD + Goal LDL<100 - Rosuvastatin 10mg  PO qhs  7. Hypokalemia -replete and recheck in a.m.+mag  All the records are reviewed and case discussed  with ED provider. Management plans discussed with the patient, family and they are in agreement.  CODE STATUS: FULL  TOTAL TIME TAKING CARE OF THIS PATIENT: 50 minutes.    on 11/30/2018 at 12:29 PM  Webb SilversmithElizabeth Nhat Hearne, DNP, FNP-BC [facility] Nurse Practitioner Between 7am to 6pm - Pager [phone]- [phone]  After 6pm go to [email or web address] - Social research officer, [email or web address] [email or web address]  [facility] [place] [facility]  Office  [phone]  CC: Primary care physician; Alba CorySowles, Krichna, MD

## 2018-11-30 NOTE — ED Triage Notes (Signed)
Pt arrives to ED via [facility] from home. Arrives on cpap. Placed on bipap upon arrival. Pt was in 70's on room air. On cpap pt came up to 81%. Upon arrival is 86% on cpap. Upon placement of bipap pt came up to 96%. Pt appears fatigued. Pt legs are swollen. Hx CHF. A&O when asked questions. No fever. EMS reports symptoms began yesterday.

## 2018-11-30 NOTE — ED Provider Notes (Addendum)
[facility] Emergency Department Provider Note  ____________________________________________   I have reviewed the triage vital signs and the nursing notes. Where available I have reviewed prior notes and, if possible and indicated, outside hospital notes.   Patient seen and evaluated during the coronavirus epidemic during a time with low staffing  Patient seen for the symptoms described in the history of present illness. She was evaluated in the context of the global COVID-19 pandemic, which necessitated consideration that the patient might be at risk for infection with the SARS-CoV-2 virus that causes COVID-19. Institutional protocols and algorithms that pertain to the evaluation of patients at risk for COVID-19 are in a state of rapid change based on information released by regulatory bodies including the CDC and federal and state organizations. These policies and algorithms were followed during the patient's care in the ED.    HISTORY  Chief Complaint Shortness of Breath  Level 5 chart caveat; no further history available due to patient status.     HPI Clifford Coudriet is a 85 y.o. female    with a history of HCM EF of 55%, with severe LVH, paroxysmal atrial fibrillation, reflux disease, thought to be a full code, comes from home, Complaining of shortness of breath.  History is mostly per EMS upon arrival patient is too dyspneic to give much of a history although she did wake up better later.  According to EMS she is not on home oxygen and she agrees with this, she was found to be hypoxic in the 70s on 2 L I placed her on BiPAP and got her into the mid 80s to low 90s prior to arrival.  Patient appeared to be fluid overloaded to them.  Patient is on Pradaxa.   Past Medical History:  Diagnosis Date  . Allergy   . Enlarged heart 2000  . GERD (gastroesophageal reflux disease)   . Hypertension   . Insomnia   . Vertigo 2006    Patient Active Problem List   Diagnosis  Date Noted  . Moderate major depression, single episode ([place]) 08/03/2018  . Thrombocytopenia ([place]) 05/27/2016  . CHF (congestive heart failure) ([place]) 05/26/2016  . Mesenteric vascular insufficiency ([place]) 02/28/2016  . Pancreas cyst 02/28/2016  . Increased endometrial stripe thickness 02/28/2016  . Carotid atherosclerosis, bilateral 01/23/2016  . Hearing loss 08/17/2015  . Mitral regurgitation 04/19/2015  . Pernicious anemia 09/27/2014  . Paroxysmal A-fib ([place]) 09/27/2014  . Benign essential HTN 09/27/2014  . Perennial allergic rhinitis 09/27/2014  . Chronic anemia 09/27/2014  . Insomnia, persistent 09/27/2014  . Hypertensive pulmonary vascular disease ([place]) 09/27/2014  . B12 deficiency 09/27/2014  . Diverticulosis of colon 09/27/2014  . Dyslipidemia 09/27/2014  . Edema extremities 09/27/2014  . Gastric reflux 09/27/2014  . Bilateral hearing loss 09/27/2014  . Polypharmacy 09/27/2014  . Pelvic pain in female 09/27/2014  . MI (mitral incompetence) 09/27/2014  . Internal hemorrhoids 09/27/2014  . Osteopenia 09/27/2014  . Arthralgia of shoulder 09/27/2014  . Finger tendinitis 09/27/2014  . Urge incontinence 09/27/2014  . Cervical prolapse 09/27/2014  . Vertigo 09/27/2014  . Vitamin D deficiency 09/27/2014  . Bladder cystocele 04/15/2012  . Asymmetric septal hypertrophy ([place]) 05/19/2011  . Hypertrophic cardiomyopathy ([place]) 05/19/2011    Past Surgical History:  Procedure Laterality Date  . BLADDER REPAIR  2014   tact    Prior to Admission medications   Medication Sig Start Date End Date Taking? Authorizing Provider  amiodarone (PACERONE) 200 MG tablet Take 1 tablet by mouth  daily. 07/13/14   [provider]  amLODipine (NORVASC) 10 MG tablet Take 1 tablet (10 mg total) by mouth daily. 11/05/18   Alba CorySowles, Krichna, MD  azelastine (ASTELIN) 0.1 % nasal spray Place 2 sprays into both nostrils 2 (two) times daily. Use in each nostril as directed 05/26/16   Alba CorySowles, Krichna,  MD  calcium-vitamin D (OSCAL WITH D) 500-200 MG-UNIT tablet Take 1 tablet by mouth 2 (two) times daily.    [provider]  carvedilol (COREG) 25 MG tablet TAKE 1 TABLET(25 MG) BY MOUTH TWICE DAILY WITH A MEAL 11/05/18   Sowles, Danna HeftyKrichna, MD  COENZYME Q10 PO Take 30 mg by mouth daily.    [provider]  diclofenac sodium (VOLTAREN) 1 % GEL APPLY TO WRIST TWICE DAILY Patient not taking: Reported on 11/05/2018 01/12/18   Alba CorySowles, Krichna, MD  ferrous gluconate (FERGON) 324 MG tablet Take 324 mg by mouth daily with breakfast.    [provider]  fluticasone (FLONASE) 50 MCG/ACT nasal spray Place 2 sprays into both nostrils daily. Patient not taking: Reported on 11/05/2018 12/18/16   Alba CorySowles, Krichna, MD  folic acid (FOLVITE) 400 MCG tablet Take 1 tablet by mouth daily. Reported on 04/19/2015 04/25/08   [provider]  irbesartan (AVAPRO) 300 MG tablet TAKE 1 TABLET BY MOUTH EVERY DAY FOR BLOOD PRESSURE 08/03/18   Alba CorySowles, Krichna, MD  omeprazole (PRILOSEC) 40 MG capsule Take 1 capsule (40 mg total) by mouth daily. 11/05/18   Alba CorySowles, Krichna, MD  PRADAXA 150 MG CAPS capsule Take 1 capsule (150 mg total) by mouth 2 (two) times daily. 08/03/18   Alba CorySowles, Krichna, MD  rosuvastatin (CRESTOR) 10 MG tablet Take 1 tablet (10 mg total) by mouth daily. 08/03/18   Alba CorySowles, Krichna, MD  solifenacin (VESICARE) 5 MG tablet Take 1 tablet (5 mg total) by mouth daily. 11/05/18   Alba CorySowles, Krichna, MD  Vitamin D, Cholecalciferol, 1000 UNITS TABS Take 1 tablet by mouth daily.    [provider]  zolpidem (AMBIEN) 5 MG tablet Take 1 tablet (5 mg total) by mouth at bedtime as needed for sleep. 11/05/18   Alba CorySowles, Krichna, MD    Allergies Nickel, Other, and Ace inhibitors  Family History  Problem Relation Age of Onset  . Alzheimer's disease Mother   . Hypertension Mother   . Stroke Father   . Heart disease Father   . Heart disease Brother   . Hypertension Brother   . Cancer Daughter         breast  . Hypertension Daughter   . Breast cancer Daughter   . Diabetes Brother   . Heart disease Brother   . Heart disease Brother   . Diabetes Brother   . Hypertension Brother   . Heart disease Brother   . Hypertension Brother   . Cancer Brother        prostate  . Diabetes Brother   . Heart disease Brother   . Hypertension Brother   . Cancer Brother        prostate    Social History Social History   Tobacco Use  . Smoking status: Never Smoker  . Smokeless tobacco: Never Used  . Tobacco comment: smoking cessation materials not required  Substance Use Topics  . Alcohol use: No    Alcohol/week: 0.0 standard drinks  . Drug use: No    Review of Systems Constitutional: No fever/chills Eyes: No visual changes. ENT: No sore throat. No stiff neck no neck pain Cardiovascular: Denies chest  pain. Respiratory: + shortness of breath. Gastrointestinal:   no vomiting.  No diarrhea.  No constipation. Genitourinary: Negative for dysuria. Musculoskeletal: Negative lower extremity swelling Skin: Negative for rash. Neurological: Negative for severe headaches, focal weakness or numbness.   ____________________________________________   PHYSICAL EXAM:  VITAL SIGNS: ED Triage Vitals  Enc Vitals Group     BP 11/30/18 0740 (!) 146/75     Pulse Rate 11/30/18 0740 61     Resp 11/30/18 0740 20     Temp 11/30/18 0740 97.6 F (36.4 C)     Temp Source 11/30/18 0740 Axillary     SpO2 11/30/18 0740 100 %     Weight 11/30/18 0737 164 lb 0.4 oz (74.4 kg)     Height 11/30/18 0737 5\' 3"  (1.6 m)     Head Circumference --      Peak Flow --      Pain Score 11/30/18 0737 0     Pain Loc --      Pain Edu? --      Excl. in GC? --     Constitutional: Initially somewhat somnolent, and acute respiratory distress Eyes: Conjunctivae are normal Head: Atraumatic HEENT: No congestion/rhinnorhea. Mucous membranes are moist.  Oropharynx non-erythematous Neck:   Nontender with no meningismus,  no masses, no stridor Cardiovascular: Normal rate, regular rhythm.  Auscultation of heart sounds limited somewhat by BiPAP/PPE good peripheral circulation. Respiratory: Patient with shortness of breath decreased in the bases, limited auscultation of lung fields due to patient inability to be compliant with deep breath Abdominal: Soft and nontender. No distention. No guarding no rebound Back:  There is no focal tenderness or step off.  there is no midline tenderness there are no lesions noted. there is no CVA tenderness Musculoskeletal: No lower extremity tenderness, no upper extremity tenderness. No joint effusions, no DVT signs strong distal pulses bilateral pitting 1-2+ mild edema Neurologic:  Normal speech and language. No gross focal neurologic deficits are appreciated.  Skin:  Skin is warm, dry and intact. No rash noted. Psychiatric: Mood and affect are normal. Speech and behavior are normal.  ____________________________________________   LABS (all labs ordered are listed, but only abnormal results are displayed)  Labs Reviewed  BLOOD GAS, VENOUS - Abnormal; Notable for the following components:      Result Value   pO2, Ven 53.0 (*)    All other components within normal limits  URINALYSIS, COMPLETE (UACMP) WITH MICROSCOPIC - Abnormal; Notable for the following components:   Color, Urine YELLOW (*)    APPearance HAZY (*)    All other components within normal limits  TROPONIN I (HIGH SENSITIVITY) - Abnormal; Notable for the following components:   Troponin I (High Sensitivity) 32 (*)    All other components within normal limits  CULTURE, BLOOD (ROUTINE X 2)  CULTURE, BLOOD (ROUTINE X 2)  URINE CULTURE  SARS CORONAVIRUS 2 (HOSPITAL ORDER, PERFORMED IN [facility])  LACTIC ACID, PLASMA  LIPASE, BLOOD  CBC WITH DIFFERENTIAL/PLATELET  BRAIN NATRIURETIC PEPTIDE  LACTIC ACID, PLASMA  COMPREHENSIVE METABOLIC PANEL    Pertinent labs  results that were available during  my care of the patient were reviewed by me and considered in my medical decision making (see chart for details). ____________________________________________  EKG  I personally interpreted any EKGs ordered by me or triage Sinus rhythm rate 65 bpm, normal axis, IVCD, long PR QTC is 496 no acute ST elevation or depression ____________________________________________  RADIOLOGY  Pertinent labs & imaging results  that were available during my care of the patient were reviewed by me and considered in my medical decision making (see chart for details). If possible, patient and/or family made aware of any abnormal findings.  Dg Chest Port 1 View  Result Date: 11/30/2018 CLINICAL DATA:  Shortness of breath EXAM: PORTABLE CHEST 1 VIEW COMPARISON:  May 26, 2016 FINDINGS: There is cardiomegaly with pulmonary venous hypertension. There is interstitial edema. There is mild patchy alveolar opacity in the mid and lower lung zones as well. No frank consolidation. There is aortic atherosclerosis. No adenopathy. No bone lesions. IMPRESSION: Pulmonary vascular congestion with interstitial and patchy alveolar edema. These are findings felt to be indicative of congestive heart failure. A degree of superimposed pneumonia in the mid lower lung zones cannot be entirely excluded. Aortic Atherosclerosis (ICD10-I70.0). Electronically Signed   By: Bretta Bang III M.D.   On: 11/30/2018 07:52   ____________________________________________    PROCEDURES  Procedure(s) performed: None  Procedures  Critical Care performed: None  ____________________________________________   INITIAL IMPRESSION / ASSESSMENT AND PLAN / ED COURSE  Pertinent labs & imaging results that were available during my care of the patient were reviewed by me and considered in my medical decision making (see chart for details).   Patient here with acute shortness of breath history of HCM.  I did place her on BiPAP and she immediately  looked better.  At this time she is awake and alert and feeling better no chest pain, and her sats are good.  Chest x-ray looks like fluid overload.  Given the history of HCM, I did defer diuretics pending discussion with cardiology.  Patient is doing quite well on BiPAP fortunately.  Blood work is pending, she does not have any chest pain.  She does not seem to have a history of infectious pathology we have however sent coronavirus, and chest x-ray shows mostly overload.  ----------------------------------------- 9:42 AM on 11/30/2018 -----------------------------------------  She continues to do quite well on BiPAP, blood pressure coming down I talked to Dr. Juliann Pares, he agreed with a small dose of Lasix, 20 mg, which I have given, and we will admit to the hospitalist service.    ____________________________________________   FINAL CLINICAL IMPRESSION(S) / ED DIAGNOSES  Final diagnoses:  SOB (shortness of breath)      This chart was dictated using voice recognition software.  Despite best efforts to proofread,  errors can occur which can change meaning.      Jeanmarie Plant, MD 11/30/18 0820    Jeanmarie Plant, MD 11/30/18 [phone]

## 2018-11-30 NOTE — ED Notes (Signed)
RT reports pt taken off BiPAP, doing well on 2L n/c.

## 2018-11-30 NOTE — Plan of Care (Signed)

## 2018-11-30 NOTE — Progress Notes (Signed)
Advanced care plan.  Purpose of the Encounter: CODE STATUS  Parties in Attendance: Patient herself and daughter in room Patient's Decision Capacity: Intact  Subjective/Patient's story: 85 year old female with past medical history of HOCM EF of 55% with severe LVH, paroxysmal atrial fibrillation on Pradaxa, GERD, hypertension, thrombocytopenia, hyperlipidemia, and chronic anemia presenting to the ED with worsening shortness of breath.    Objective/Medical story I discussed with the patient daughter regarding her desires for cardiac and pulmonary resuscitation and a living will and healthcare power of attorney.   Goals of care determination:  Patient states that at this point she is undecided and would like everything to be done  CODE STATUS:  Full code Time spent discussing advanced care planning: 16 minutes

## 2018-11-30 NOTE — Progress Notes (Signed)
PHARMACIST - PHYSICIAN ORDER COMMUNICATION  CONCERNING: P&T Medication Policy on Herbal Medications  DESCRIPTION:  This patient's order for:  Co-enzyme Q10 has been noted.  This product(s) is classified as an "herbal" or natural product. Due to a lack of definitive safety studies or FDA approval, nonstandard manufacturing practices, plus the potential risk of unknown drug-drug interactions while on inpatient medications, the Pharmacy and Therapeutics Committee does not permit the use of "herbal" or natural products of this type within [place].   ACTION TAKEN: The pharmacy department is unable to verify this order at this time and your patient has been informed of this safety policy. Please reevaluate patient's clinical condition at discharge and address if the herbal or natural product(s) should be resumed at that time.   

## 2018-11-30 NOTE — ED Notes (Signed)
Amy RT called to trial pt off BiPAP/CPAP per verbal from admitting provider.

## 2018-12-01 LAB — ECHOCARDIOGRAM COMPLETE
Height: 63 in
Weight: 2499.2 oz

## 2018-12-01 LAB — BLOOD CULTURE ID PANEL (REFLEXED)

## 2018-12-01 LAB — BASIC METABOLIC PANEL
Anion gap: 8 (ref 5–15)
BUN: 16 mg/dL (ref 8–23)
CO2: 27 mmol/L (ref 22–32)
Calcium: 9 mg/dL (ref 8.9–10.3)
Chloride: 103 mmol/L (ref 98–111)
Creatinine, Ser: 0.88 mg/dL (ref 0.44–1.00)
GFR calc Af Amer: >60 mL/min (ref >60)
GFR calc non Af Amer: >60 mL/min (ref >60)
Glucose, Bld: 115 mg/dL — ABNORMAL HIGH (ref 70–99)
Potassium: 2.8 mmol/L — ABNORMAL LOW (ref 3.5–5.1)
Sodium: 138 mmol/L (ref 135–145)

## 2018-12-01 LAB — URINE CULTURE: Culture: NO GROWTH

## 2018-12-01 LAB — MAGNESIUM: Magnesium: 1.9 mg/dL (ref 1.7–2.4)

## 2018-12-01 MED ORDER — ALUM & MAG HYDROXIDE-SIMETH 200-200-20 MG/5ML PO SUSP
15.0000 mL | Freq: Four times a day (QID) | ORAL | Status: DC | PRN
  Administered 2018-12-01 (×2): 15 mL via ORAL
  Filled 2018-12-01 (×2): qty 30

## 2018-12-01 MED ORDER — ENSURE ENLIVE PO LIQD
237.0000 mL | Freq: Two times a day (BID) | ORAL | Status: DC
  Administered 2018-12-01 – 2018-12-02 (×2): 237 mL via ORAL

## 2018-12-01 MED ORDER — POTASSIUM CHLORIDE CRYS ER 20 MEQ PO TBCR
40.0000 meq | EXTENDED_RELEASE_TABLET | Freq: Three times a day (TID) | ORAL | Status: DC
  Administered 2018-12-01 (×3): 40 meq via ORAL
  Filled 2018-12-01 (×3): qty 2

## 2018-12-01 MED ORDER — MAGNESIUM SULFATE 2 GM/50ML IV SOLN
2.0000 g | Freq: Once | INTRAVENOUS | Status: AC
  Administered 2018-12-01: 2 g via INTRAVENOUS
  Filled 2018-12-01: qty 50

## 2018-12-01 NOTE — Progress Notes (Signed)
PHARMACY - PHYSICIAN COMMUNICATION CRITICAL VALUE ALERT - BLOOD CULTURE IDENTIFICATION (BCID)  Sharon Bullock is an 85 y.o. female who presented to [facility] on 11/30/2018 with a chief complaint of respiratory distress.  Assessment:    BCX positive, 1 of 4 w/ staph species, WBC normal, afebrile.  Looks consistent w/ contaminant.  Not on any ABX  Name of physician (or Provider) Contacted: Dr Sidney Ace  Current antibiotics: none  Changes to prescribed antibiotics recommended:  Per Dr Sidney Ace do not start ABX  Results for orders placed or performed during the hospital encounter of 11/30/18  Blood Culture ID Panel (Reflexed) (Collected: 11/30/2018  7:28 AM)  Result Value Ref Range   Enterococcus species NOT DETECTED NOT DETECTED   Listeria monocytogenes NOT DETECTED NOT DETECTED   Staphylococcus species DETECTED (A) NOT DETECTED   Staphylococcus aureus (BCID) NOT DETECTED NOT DETECTED   Methicillin resistance NOT DETECTED NOT DETECTED   Streptococcus species NOT DETECTED NOT DETECTED   Streptococcus agalactiae NOT DETECTED NOT DETECTED   Streptococcus pneumoniae NOT DETECTED NOT DETECTED   Streptococcus pyogenes NOT DETECTED NOT DETECTED   Acinetobacter baumannii NOT DETECTED NOT DETECTED   Enterobacteriaceae species NOT DETECTED NOT DETECTED   Enterobacter cloacae complex NOT DETECTED NOT DETECTED   Escherichia coli NOT DETECTED NOT DETECTED   Klebsiella oxytoca NOT DETECTED NOT DETECTED   Klebsiella pneumoniae NOT DETECTED NOT DETECTED   Proteus species NOT DETECTED NOT DETECTED   Serratia marcescens NOT DETECTED NOT DETECTED   Haemophilus influenzae NOT DETECTED NOT DETECTED   Neisseria meningitidis NOT DETECTED NOT DETECTED   Pseudomonas aeruginosa NOT DETECTED NOT DETECTED   Candida albicans NOT DETECTED NOT DETECTED   Candida glabrata NOT DETECTED NOT DETECTED   Candida krusei NOT DETECTED NOT DETECTED   Candida parapsilosis NOT DETECTED NOT DETECTED   Candida tropicalis NOT  DETECTED NOT DETECTED    Hart Robinsons A 12/01/2018  6:23 AM

## 2018-12-01 NOTE — Progress Notes (Signed)
Pt and pts daughter shown EMMI- CHF video

## 2018-12-01 NOTE — Plan of Care (Signed)
Nutrition Education Note  RD consulted for nutrition education regarding new onset CHF.  85 year old female with past medical history of HOCM EF of 55% with severe LVH, paroxysmal atrial fibrillation on Pradaxa, GERD, hypertension, thrombocytopenia, hyperlipidemia, and chronic anemia presenting to the ED with worsening shortness of breath.   RD provided "Low Sodium Nutrition Therapy" handout from the Academy of [employer]etics. Reviewed patient's dietary recall. Provided examples on ways to decrease sodium intake in diet. Discouraged intake of processed foods and use of salt shaker. Encouraged fresh fruits and vegetables as well as whole grain sources of carbohydrates to maximize fiber intake.   RD discussed why it is important for patient to adhere to diet recommendations, and emphasized the role of fluids, foods to avoid, and importance of weighing self daily. Teach back method used.  Expect good compliance.  Body mass index is 27.69 kg/m. Pt meets criteria for overweight based on current BMI.  Current diet order is 2 gram diet, patient is consuming approximately 100% of meals at this time. Labs and medications reviewed.   Ensure Enlive po BID, each supplement provides 350 kcal and 20 grams of protein  No further nutrition interventions warranted at this time. RD contact information provided. If additional nutrition issues arise, please re-consult RD.   Koleen Distance MS, RD, LDN Pager #- [phone] Office#- [phone] After Hours Pager: [phone]

## 2018-12-01 NOTE — Progress Notes (Signed)
Patient ID: Beryle FlockZeola Comunale, female   DOB: Sep 17, 1933, 85 y.o.   MRN: [id number]  [facility] PROGRESS NOTE  Beryle FlockZeola Barot HQI:[id number] DOB: Sep 17, 1933 DOA: 11/30/2018 PCP: Alba CorySowles, Krichna, MD  HPI/Subjective: Patient feeling better with regards to her breathing.  Came in with shortness of breath.  Last night she had a lot of acid reflux because she took her blood thinner without eating food.  Acid reflux is better today.  Objective: Vitals:   12/01/18 0333 12/01/18 0818  BP: (!) 151/65 (!) 130/58  Pulse: 65 (!) 57  Resp:    Temp: 98.9 F (37.2 C) 98.1 F (36.7 C)  SpO2: 95% 95%    Filed Weights   11/30/18 1423 12/01/18 0333 12/01/18 0441  Weight: 70.9 kg 70.4 kg 70.9 kg    ROS: Review of Systems  Constitutional: Negative for chills and fever.  Eyes: Negative for blurred vision.  Respiratory: Positive for shortness of breath. Negative for cough.   Cardiovascular: Negative for chest pain.  Gastrointestinal: Positive for heartburn. Negative for abdominal pain, constipation, diarrhea, nausea and vomiting.  Genitourinary: Negative for dysuria.  Musculoskeletal: Negative for joint pain.  Neurological: Negative for dizziness and headaches.   Exam: Physical Exam  Constitutional: She is oriented to person, place, and time.  HENT:  Nose: No mucosal edema.  Mouth/Throat: No oropharyngeal exudate or posterior oropharyngeal edema.  Eyes: Pupils are equal, round, and reactive to light. Conjunctivae, EOM and lids are normal.  Neck: No JVD present. Carotid bruit is not present. No edema present. No thyroid mass and no thyromegaly present.  Cardiovascular: S1 normal and S2 normal. Exam reveals no gallop.  Murmur heard.  Systolic murmur is present with a grade of 4/6. Pulses:      Dorsalis pedis pulses are 2+ on the right side and 2+ on the left side.  Respiratory: No respiratory distress. She has decreased breath sounds in the right lower field and the left lower field. She has no  wheezes. She has no rhonchi. She has no rales.  GI: Soft. Bowel sounds are normal. There is no abdominal tenderness.  Musculoskeletal:     Right ankle: She exhibits swelling.     Left ankle: She exhibits swelling.  Lymphadenopathy:    She has no cervical adenopathy.  Neurological: She is alert and oriented to person, place, and time. No cranial nerve deficit.  Skin: Skin is warm. No rash noted. Nails show no clubbing.  Psychiatric: She has a normal mood and affect.      Data Reviewed: Basic Metabolic Panel: Recent Labs  Lab 11/30/18 0728 12/01/18 0520  NA 136 138  K 3.4* 2.8*  CL 106 103  CO2 23 27  GLUCOSE 188* 115*  BUN 17 16  CREATININE 0.99 0.88  CALCIUM 8.9 9.0  MG  --  1.9   Liver Function Tests: Recent Labs  Lab 11/30/18 0728  AST 25  ALT 17  ALKPHOS 82  BILITOT 0.7  PROT 7.4  ALBUMIN 3.7   Recent Labs  Lab 11/30/18 0728  LIPASE 30   No results for input(s): AMMONIA in the last 168 hours. CBC: Recent Labs  Lab 11/30/18 0728  WBC 8.6  NEUTROABS 6.3  HGB 11.8*  HCT 38.1  MCV 97.2  PLT 125*   BNP (last 3 results) Recent Labs    11/30/18 0727  BNP 411.0*     Recent Results (from the past 240 hour(s))  Urine culture     Status: None   Collection Time: 11/30/18  7:27 AM   Specimen: Urine, Random  Result Value Ref Range Status   Specimen Description   Final    URINE, RANDOM Performed at [facility], [address] [place], [place] [address]    Special Requests   Final    NONE Performed at [facility], [address] [place], [place] [address]    Culture   Final    NO GROWTH Performed at [facility], [address] [place], [place] [address]    Report Status 12/01/2018 FINAL  Final  SARS Coronavirus 2 [facility](Hospital order, Performed in [facility] hospital lab) Nasopharyngeal Nasopharyngeal Swab     Status: None   Collection Time: 11/30/18  7:27 AM   Specimen: Nasopharyngeal Swab  Result Value Ref Range  Status   SARS Coronavirus 2 NEGATIVE NEGATIVE Final    Comment: (NOTE) If result is NEGATIVE SARS-CoV-2 target nucleic acids are NOT DETECTED. The SARS-CoV-2 RNA is generally detectable in upper and lower  respiratory specimens during the acute phase of infection. The lowest  concentration of SARS-CoV-2 viral copies this assay can detect is 250  copies / mL. A negative result does not preclude SARS-CoV-2 infection  and should not be used as the sole basis for treatment or other  patient management decisions.  A negative result may occur with  improper specimen collection / handling, submission of specimen other  than nasopharyngeal swab, presence of viral mutation(s) within the  areas targeted by this assay, and inadequate number of viral copies  (<250 copies / mL). A negative result must be combined with clinical  observations, patient history, and epidemiological information. If result is POSITIVE SARS-CoV-2 target nucleic acids are DETECTED. The SARS-CoV-2 RNA is generally detectable in upper and lower  respiratory specimens dur ing the acute phase of infection.  Positive  results are indicative of active infection with SARS-CoV-2.  Clinical  correlation with patient history and other diagnostic information is  necessary to determine patient infection status.  Positive results do  not rule out bacterial infection or co-infection with other viruses. If result is PRESUMPTIVE POSTIVE SARS-CoV-2 nucleic acids MAY BE PRESENT.   A presumptive positive result was obtained on the submitted specimen  and confirmed on repeat testing.  While 2019 novel coronavirus  (SARS-CoV-2) nucleic acids may be present in the submitted sample  additional confirmatory testing may be necessary for epidemiological  and / or clinical management purposes  to differentiate between  SARS-CoV-2 and other Sarbecovirus currently known to infect humans.  If clinically indicated additional testing with an alternate  test  methodology [phone] is advised. The SARS-CoV-2 RNA is generally  detectable in upper and lower respiratory sp ecimens during the acute  phase of infection. The expected result is Negative. Fact Sheet for Patients:  BoilerBrush.com.cyhttps://www.fda.gov/media/136312/download Fact Sheet for Healthcare Providers: https://pope.com/https://www.fda.gov/media/136313/download This test is not yet approved or cleared by the [place] FDA and has been authorized for detection and/or diagnosis of SARS-CoV-2 by FDA under an Emergency Use Authorization (EUA).  This EUA will remain in effect (meaning this test can be used) for the duration of the COVID-19 declaration under Section 564(b)(1) of the Act, 21 U.S.C. section 360bbb-3(b)(1), unless the authorization is terminated or revoked sooner. Performed at [facility], [address] [place], [place] [address]   Culture, blood (routine x 2)     Status: None (Preliminary result)   Collection Time: 11/30/18  7:28 AM   Specimen: BLOOD  Result Value Ref Range Status  Specimen Description   Final    BLOOD LEFT AC Performed at [facility], [address] [place], [place] [address]    Special Requests   Final    BOTTLES DRAWN AEROBIC AND ANAEROBIC Blood Culture adequate volume Performed at [facility], [address] [place], [place] [address]    Culture  Setup Time   Final    Organism ID to follow [place] ONLY CRITICAL RESULT CALLED TO, READ BACK BY AND VERIFIED WITH: [place] @320  12/01/2018 TTG Performed at [facility], [address], [place], [place] [address]    Culture GRAM POSITIVE COCCI  Final   Report Status PENDING  Incomplete  Culture, blood (routine x 2)     Status: None (Preliminary result)   Collection Time: 11/30/18  7:28 AM   Specimen: BLOOD  Result Value Ref Range Status   Specimen Description BLOOD RIGHT HAND  Final   Special Requests   Final    BOTTLES DRAWN AEROBIC AND  ANAEROBIC Blood Culture results may not be optimal due to an excessive volume of blood received in culture bottles   Culture   Final    NO GROWTH < 24 HOURS Performed at [facility], [address] [place], [place] [address]    Report Status PENDING  Incomplete  Blood Culture ID Panel (Reflexed)     Status: Abnormal   Collection Time: 11/30/18  7:28 AM  Result Value Ref Range Status   Enterococcus species NOT DETECTED NOT DETECTED Final   Listeria monocytogenes NOT DETECTED NOT DETECTED Final   Staphylococcus species DETECTED (A) NOT DETECTED Final    Comment: Methicillin (oxacillin) susceptible coagulase negative staphylococcus. Possible blood culture contaminant (unless isolated from more than one blood culture draw or clinical case suggests pathogenicity). No antibiotic treatment is indicated for blood  culture contaminants. CRITICAL RESULT CALLED TO, READ BACK BY AND VERIFIED WITH: SCOTT H[place] @320  12/01/2018 TTG    Staphylococcus aureus (BCID) NOT DETECTED NOT DETECTED Final   Methicillin resistance NOT DETECTED NOT DETECTED Final   Streptococcus species NOT DETECTED NOT DETECTED Final   Streptococcus agalactiae NOT DETECTED NOT DETECTED Final   Streptococcus pneumoniae NOT DETECTED NOT DETECTED Final   Streptococcus pyogenes NOT DETECTED NOT DETECTED Final   Acinetobacter baumannii NOT DETECTED NOT DETECTED Final   Enterobacteriaceae species NOT DETECTED NOT DETECTED Final   Enterobacter cloacae complex NOT DETECTED NOT DETECTED Final   Escherichia coli NOT DETECTED NOT DETECTED Final   Klebsiella oxytoca NOT DETECTED NOT DETECTED Final   Klebsiella pneumoniae NOT DETECTED NOT DETECTED Final   Proteus species NOT DETECTED NOT DETECTED Final   Serratia marcescens NOT DETECTED NOT DETECTED Final   Haemophilus influenzae NOT DETECTED NOT DETECTED Final   Neisseria meningitidis NOT DETECTED NOT DETECTED Final   Pseudomonas aeruginosa NOT DETECTED NOT DETECTED Final    Candida albicans NOT DETECTED NOT DETECTED Final   Candida glabrata NOT DETECTED NOT DETECTED Final   Candida krusei NOT DETECTED NOT DETECTED Final   Candida parapsilosis NOT DETECTED NOT DETECTED Final   Candida tropicalis NOT DETECTED NOT DETECTED Final    Comment: Performed at [facility], [address], [place], [place] [address]     Studies: Dg Chest Port 1 View  Result Date: 11/30/2018 CLINICAL DATA:  Shortness of breath EXAM: PORTABLE CHEST 1 VIEW COMPARISON:  May 26, 2016 FINDINGS: There is cardiomegaly with pulmonary venous hypertension. There is interstitial edema. There is mild patchy  alveolar opacity in the mid and lower lung zones as well. No frank consolidation. There is aortic atherosclerosis. No adenopathy. No bone lesions. IMPRESSION: Pulmonary vascular congestion with interstitial and patchy alveolar edema. These are findings felt to be indicative of congestive heart failure. A degree of superimposed pneumonia in the mid lower lung zones cannot be entirely excluded. Aortic Atherosclerosis (ICD10-I70.0). Electronically Signed   By: Bretta Bang III M.D.   On: 11/30/2018 07:52    Scheduled Meds: . amiodarone  200 mg Oral Daily  . amLODipine  10 mg Oral Daily  . azelastine  2 spray Each Nare BID  . calcium-vitamin D  1 tablet Oral BID  . carvedilol  25 mg Oral BID WC  . cholecalciferol  1,000 Units Oral Daily  . [START ON 12/14/2018] cyanocobalamin  1,000 mcg Intramuscular Q30 days  . dabigatran  150 mg Oral BID  . darifenacin  7.5 mg Oral Daily  . feeding supplement (ENSURE ENLIVE)  237 mL Oral BID BM  . ferrous gluconate  324 mg Oral Q breakfast  . folic acid  500 mcg Oral Daily  . furosemide  20 mg Intravenous Q12H  . irbesartan  300 mg Oral Daily  . pantoprazole  40 mg Oral Daily  . potassium chloride  40 mEq Oral TID  . rosuvastatin  10 mg Oral Daily  . sodium chloride flush  3 mL Intravenous Q12H   Continuous Infusions: . sodium chloride     . magnesium sulfate bolus IVPB      Assessment/Plan:  1. Acute hypoxic respiratory failure secondary to CHF exacerbation.  Patient was initially on BiPAP and now on nasal cannula.  Check pulse ox on room air with ambulation 2. Acute on chronic diastolic congestive heart failure with history of hypertrophic cardiomyopathy.  Continue low-dose IV Lasix.  Follow-up echocardiogram.  Patient also on Coreg and irbesartan 3. Hypokalemia and hypomagnesemia replace potassium orally and magnesium IV. 4. Paroxysmal atrial fibrillation continue anticoagulation with Pradaxa patient on amiodarone and Coreg for rate control 5. Elevated troponin secondary to demand ischemia 6. Essential hypertension continue usual medications  Code Status:     Code Status Orders  (From admission, onward)         Start     Ordered   11/30/18 1112  Full code  Continuous     11/30/18 1113        Code Status History    This patient has a current code status but no historical code status.   Advance Care Planning Activity     Family Communication: Spoke with son on the phone Disposition Plan: To be determined  Consultants:  Cardiology  Time spent: 28 minutes  Thedford Bunton [employer]

## 2018-12-02 LAB — BASIC METABOLIC PANEL
Anion gap: 7 (ref 5–15)
BUN: 19 mg/dL (ref 8–23)
CO2: 25 mmol/L (ref 22–32)
Calcium: 9.1 mg/dL (ref 8.9–10.3)
Chloride: 102 mmol/L (ref 98–111)
Creatinine, Ser: 0.98 mg/dL (ref 0.44–1.00)
GFR calc Af Amer: >60 mL/min (ref >60)
GFR calc non Af Amer: 53 mL/min — ABNORMAL LOW (ref >60)
Glucose, Bld: 115 mg/dL — ABNORMAL HIGH (ref 70–99)
Potassium: 4.4 mmol/L (ref 3.5–5.1)
Sodium: 134 mmol/L — ABNORMAL LOW (ref 135–145)

## 2018-12-02 LAB — CULTURE, BLOOD (ROUTINE X 2): Special Requests: ADEQUATE

## 2018-12-02 LAB — MAGNESIUM: Magnesium: 2.5 mg/dL — ABNORMAL HIGH (ref 1.7–2.4)

## 2018-12-02 MED ORDER — AMOXICILLIN-POT CLAVULANATE 875-125 MG PO TABS: 1.0000 | ORAL_TABLET | Freq: Two times a day (BID) | ORAL | 0 refills | Status: DC

## 2018-12-02 MED ORDER — POTASSIUM CHLORIDE CRYS ER 20 MEQ PO TBCR
20.0000 meq | EXTENDED_RELEASE_TABLET | Freq: Every day | ORAL | Status: DC
  Administered 2018-12-02: 20 meq via ORAL
  Filled 2018-12-02: qty 1

## 2018-12-02 MED ORDER — ENSURE ENLIVE PO LIQD: 237.0000 mL | Freq: Two times a day (BID) | ORAL | 0 refills | Status: DC

## 2018-12-02 MED ORDER — FUROSEMIDE 20 MG PO TABS: 20.0000 mg | ORAL_TABLET | Freq: Every day | ORAL | 0 refills | Status: DC

## 2018-12-02 MED ORDER — POTASSIUM CHLORIDE CRYS ER 20 MEQ PO TBCR: 20.0000 meq | EXTENDED_RELEASE_TABLET | Freq: Every day | ORAL | 0 refills | Status: DC

## 2018-12-02 MED ORDER — AMOXICILLIN-POT CLAVULANATE 875-125 MG PO TABS
1.0000 | ORAL_TABLET | Freq: Two times a day (BID) | ORAL | Status: DC
  Administered 2018-12-02: 1 via ORAL
  Filled 2018-12-02: qty 1

## 2018-12-02 NOTE — Discharge Summary (Signed)
[employer] - [place] at [facility]   PATIENT NAME: Sharon Bullock    MR#:  [id number]  DATE OF BIRTH:  1933/12/14  DATE OF ADMISSION:  11/30/2018 ADMITTING PHYSICIAN: Jimmye NormanElizabeth Achieng Ouma, NP  DATE OF DISCHARGE: 12/02/2018  2:14 PM  PRIMARY CARE PHYSICIAN: Alba CorySowles, Krichna, MD    ADMISSION DIAGNOSIS:  SOB (shortness of breath) [R06.02] B12 deficiency [E53.8] Acute respiratory failure with hypoxia (HCC) [J96.01]  DISCHARGE DIAGNOSIS:  Active Problems:   Acute respiratory failure with hypoxia (HCC)   SECONDARY DIAGNOSIS:   Past Medical History:  Diagnosis Date  . Allergy   . Enlarged heart 2000  . GERD (gastroesophageal reflux disease)   . Hypertension   . Insomnia   . Vertigo 2006    HOSPITAL COURSE:   1.  Acute hypoxic respiratory secondary to CHF exacerbation.  The patient was initially on BiPAP and tapered off oxygen by the time of discharge. 2.  Acute on chronic diastolic congestive heart failure with history of hypertrophic cardiomyopathy.  The patient was on low-dose IV Lasix here in the hospital and diuresed well.  EF greater than 65% moderate concentric left ventricular hypertrophy.  Patient discharged home on Lasix.  Patient already on Coreg and Avapro. 3.  Hypokalemia and hypomagnesemia.  The patient had replacement of potassium and magnesium during the hospital course.  I will give potassium supplementation upon going home 4.  Paroxysmal atrial fibrillation patient on anticoagulation with Pradaxa and rate controlled with amiodarone and Coreg. 5.  Elevated troponin secondary to demand ischemia. 6.  Essential hypertension.  Continue usual medications. 7.  Low-grade fever.  Patient's COVID-19 test was negative.  Looking back at chest x-ray on admission they could not rule out pneumonia.  I did prescribe 5-day course of Augmentin.  1 set of blood cultures growing staph species which is likely a contamination.   DISCHARGE CONDITIONS:    Satisfactory  CONSULTS OBTAINED:  Treatment Team:  Alwyn Peaallwood, Dwayne D, MD  DRUG ALLERGIES:   Allergies  Allergen Reactions  . Nickel Dermatitis and Swelling  . Other Anaphylaxis    Uncoded Allergy. Allergen: SHELLFISH  . Ace Inhibitors Cough    DISCHARGE MEDICATIONS:   Allergies as of 12/02/2018      Reactions   Nickel Dermatitis, Swelling   Other Anaphylaxis   Uncoded Allergy. Allergen: SHELLFISH   Ace Inhibitors Cough      Medication List    STOP taking these medications   diclofenac sodium 1 % Gel Commonly known as: VOLTAREN   fluticasone 50 MCG/ACT nasal spray Commonly known as: FLONASE     TAKE these medications   amiodarone 200 MG tablet Commonly known as: PACERONE Take 1 tablet by mouth daily.   amLODipine 10 MG tablet Commonly known as: NORVASC Take 1 tablet (10 mg total) by mouth daily.   amoxicillin-clavulanate 875-125 MG tablet Commonly known as: AUGMENTIN Take 1 tablet by mouth every 12 (twelve) hours.   azelastine 0.1 % nasal spray Commonly known as: Astelin Place 2 sprays into both nostrils 2 (two) times daily. Use in each nostril as directed   calcium-vitamin D 500-200 MG-UNIT tablet Commonly known as: OSCAL WITH D Take 1 tablet by mouth 2 (two) times daily.   carvedilol 25 MG tablet Commonly known as: COREG TAKE 1 TABLET(25 MG) BY MOUTH TWICE DAILY WITH A MEAL What changed:   how much to take  how to take this  when to take this  additional instructions   co-enzyme Q-10 30 MG capsule  Take 30 mg by mouth daily.   feeding supplement (ENSURE ENLIVE) Liqd Take 237 mLs by mouth 2 (two) times daily between meals.   ferrous gluconate 324 MG tablet Commonly known as: FERGON Take 324 mg by mouth daily with breakfast.   folic acid 400 MCG tablet Commonly known as: FOLVITE Take 400 mcg by mouth daily.   furosemide 20 MG tablet Commonly known as: Lasix Take 1 tablet (20 mg total) by mouth daily.   irbesartan 300 MG  tablet Commonly known as: AVAPRO TAKE 1 TABLET BY MOUTH EVERY DAY FOR BLOOD PRESSURE What changed:   how much to take  how to take this  when to take this  additional instructions   omeprazole 40 MG capsule Commonly known as: PRILOSEC Take 1 capsule (40 mg total) by mouth daily.   potassium chloride SA 20 MEQ tablet Commonly known as: K-DUR Take 1 tablet (20 mEq total) by mouth daily.   Pradaxa 150 MG Caps capsule Generic drug: dabigatran Take 1 capsule (150 mg total) by mouth 2 (two) times daily.   rosuvastatin 10 MG tablet Commonly known as: CRESTOR Take 1 tablet (10 mg total) by mouth daily.   solifenacin 5 MG tablet Commonly known as: VESIcare Take 1 tablet (5 mg total) by mouth daily.   Vitamin D (Cholecalciferol) 25 MCG (1000 UT) Tabs Take 1,000 Units by mouth daily.   zolpidem 5 MG tablet Commonly known as: AMBIEN Take 1 tablet (5 mg total) by mouth at bedtime as needed for sleep.        DISCHARGE INSTRUCTIONS:   Follow-up PMD 5 days Follow-up your cardiologist 1 to 2 weeks Follow-up CHF clinic  If you experience worsening of your admission symptoms, develop shortness of breath, life threatening emergency, suicidal or homicidal thoughts you must seek medical attention immediately by calling 911 or calling your MD immediately  if symptoms less severe.  You Must read complete instructions/literature along with all the possible adverse reactions/side effects for all the Medicines you take and that have been prescribed to you. Take any new Medicines after you have completely understood and accept all the possible adverse reactions/side effects.   Please note  You were cared for by a hospitalist during your hospital stay. If you have any questions about your discharge medications or the care you received while you were in the hospital after you are discharged, you can call the unit and asked to speak with the hospitalist on call if the hospitalist that took  care of you is not available. Once you are discharged, your primary care physician will handle any further medical issues. Please note that NO REFILLS for any discharge medications will be authorized once you are discharged, as it is imperative that you return to your primary care physician (or establish a relationship with a primary care physician if you do not have one) for your aftercare needs so that they can reassess your need for medications and monitor your lab values.    Today   CHIEF COMPLAINT:   Chief Complaint  Patient presents with  . Shortness of Breath    HISTORY OF PRESENT ILLNESS:  Sharon Bullock  is a [age] y.o. female came in with shortness of breath   VITAL SIGNS:  Blood pressure (!) 132/54, pulse (!) 56, temperature 98.6 F (37 C), temperature source Oral, resp. rate 18, height 5\' 3"  (1.6 m), weight 70.4 kg, SpO2 98 %.   PHYSICAL EXAMINATION:  GENERAL:  85 y.o.-year-old patient lying in the  bed with no acute distress.  EYES: Pupils equal, round, reactive to light and accommodation. No scleral icterus. Extraocular muscles intact.  HEENT: Head atraumatic, normocephalic. Oropharynx and nasopharynx clear.  NECK:  Supple, no jugular venous distention. No thyroid enlargement, no tenderness.  LUNGS: Normal breath sounds bilaterally, no wheezing, rales,rhonchi or crepitation. No use of accessory muscles of respiration.  CARDIOVASCULAR: S1, S2 normal. No murmurs, rubs, or gallops.  ABDOMEN: Soft, non-tender, non-distended. Bowel sounds present. No organomegaly or mass.  EXTREMITIES: Trace pedal edema. No cyanosis, or clubbing.  NEUROLOGIC: Cranial nerves II through XII are intact. Muscle strength 5/5 in all extremities. Sensation intact. Gait not checked.  PSYCHIATRIC: The patient is alert and oriented x 3.  SKIN: No obvious rash, lesion, or ulcer.   DATA REVIEW:   CBC Recent Labs  Lab 11/30/18 0728  WBC 8.6  HGB 11.8*  HCT 38.1  PLT 125*    Chemistries  Recent  Labs  Lab 11/30/18 0728  12/02/18 0528  NA 136   < > 134*  K 3.4*   < > 4.4  CL 106   < > 102  CO2 23   < > 25  GLUCOSE 188*   < > 115*  BUN 17   < > 19  CREATININE 0.99   < > 0.98  CALCIUM 8.9   < > 9.1  MG  --    < > 2.5*  AST 25  --   --   ALT 17  --   --   ALKPHOS 82  --   --   BILITOT 0.7  --   --    < > = values in this interval not displayed.    Microbiology Results  Results for orders placed or performed during the hospital encounter of 11/30/18  Urine culture     Status: None   Collection Time: 11/30/18  7:27 AM   Specimen: Urine, Random  Result Value Ref Range Status   Specimen Description   Final    URINE, RANDOM Performed at [facility], [address], [place], [place] [address]    Special Requests   Final    NONE Performed at [facility], [address], [place], [place] [address]    Culture   Final    NO GROWTH Performed at [facility], [place] [address], [place], [place] [address]    Report Status 12/01/2018 FINAL  Final  SARS Coronavirus 2 [facility] order, Performed in [facility] hospital lab) Nasopharyngeal Nasopharyngeal Swab     Status: None   Collection Time: 11/30/18  7:27 AM   Specimen: Nasopharyngeal Swab  Result Value Ref Range Status   SARS Coronavirus 2 NEGATIVE NEGATIVE Final    Comment: (NOTE) If result is NEGATIVE SARS-CoV-2 target nucleic acids are NOT DETECTED. The SARS-CoV-2 RNA is generally detectable in upper and lower  respiratory specimens during the acute phase of infection. The lowest  concentration of SARS-CoV-2 viral copies this assay can detect is 250  copies / mL. A negative result does not preclude SARS-CoV-2 infection  and should not be used as the sole basis for treatment or other  patient management decisions.  A negative result may occur with  improper specimen collection / handling, submission of specimen other  than nasopharyngeal swab, presence of viral mutation(s) within the  areas  targeted by this assay, and inadequate number of viral copies  (<250 copies / mL). A negative result must be combined with clinical  observations, patient history, and epidemiological information. If result is POSITIVE SARS-CoV-2 target nucleic acids are DETECTED. The SARS-CoV-2 RNA is generally detectable in upper and lower  respiratory specimens dur ing the acute phase of infection.  Positive  results are indicative of active infection with SARS-CoV-2.  Clinical  correlation with patient history and other diagnostic information is  necessary to determine patient infection status.  Positive results do  not rule out bacterial infection or co-infection with other viruses. If result is PRESUMPTIVE POSTIVE SARS-CoV-2 nucleic acids MAY BE PRESENT.   A presumptive positive result was obtained on the submitted specimen  and confirmed on repeat testing.  While 2019 novel coronavirus  (SARS-CoV-2) nucleic acids may be present in the submitted sample  additional confirmatory testing may be necessary for epidemiological  and / or clinical management purposes  to differentiate between  SARS-CoV-2 and other Sarbecovirus currently known to infect humans.  If clinically indicated additional testing with an alternate test  methodology [phone]([id number]) is advised. The SARS-CoV-2 RNA is generally  detectable in upper and lower respiratory sp ecimens during the acute  phase of infection. The expected result is Negative. Fact Sheet for Patients:  BoilerBrush.com.cyhttps://www.fda.gov/media/136312/download Fact Sheet for Healthcare Providers: https://pope.com/https://www.fda.gov/media/136313/download This test is not yet approved or cleared by the [place] FDA and has been authorized for detection and/or diagnosis of SARS-CoV-2 by FDA under an Emergency Use Authorization (EUA).  This EUA will remain in effect (meaning this test can be used) for the duration of the COVID-19 declaration under Section 564(b)(1) of the Act, 21 U.S.C. section  360bbb-3(b)(1), unless the authorization is terminated or revoked sooner. Performed at [facility], [address] [place], [place] [address]   Culture, blood (routine x 2)     Status: Abnormal   Collection Time: 11/30/18  7:28 AM   Specimen: BLOOD  Result Value Ref Range Status   Specimen Description   Final    BLOOD LEFT [facility] Performed at [facility], [address] [place], [place] [address]    Special Requests   Final    BOTTLES DRAWN AEROBIC AND ANAEROBIC Blood Culture adequate volume Performed at [facility], [address] [place], [place] [address]    Culture  Setup Time   Final    GRAM POSITIVE COCCI AEROBIC BOTTLE ONLY CRITICAL RESULT CALLED TO, READ BACK BY AND VERIFIED WITH: SCOTT HALL @320  12/01/2018 TTG    Culture (A)  Final    STAPHYLOCOCCUS SPECIES (COAGULASE NEGATIVE) THE SIGNIFICANCE OF ISOLATING THIS ORGANISM FROM A SINGLE SET OF BLOOD CULTURES WHEN MULTIPLE SETS ARE DRAWN IS UNCERTAIN. PLEASE NOTIFY THE MICROBIOLOGY DEPARTMENT WITHIN ONE WEEK IF SPECIATION AND SENSITIVITIES ARE REQUIRED. Performed at [facility], [address] [place], [place] [address]    Report Status 12/02/2018 FINAL  Final  Culture, blood (routine x 2)     Status: None (Preliminary result)   Collection Time: 11/30/18  7:28 AM   Specimen: BLOOD  Result Value Ref Range Status   Specimen Description BLOOD RIGHT HAND  Final   Special Requests   Final    BOTTLES DRAWN AEROBIC AND ANAEROBIC Blood Culture results may not be optimal due to an excessive volume of blood received in culture bottles   Culture   Final    NO GROWTH 2 DAYS Performed at [facility], [address] [place], [place] [address]    Report Status PENDING  Incomplete  Blood Culture ID Panel (Reflexed)  Status: Abnormal   Collection Time: 11/30/18  7:28 AM  Result Value Ref Range Status   Enterococcus species NOT DETECTED NOT DETECTED Final   Listeria monocytogenes  NOT DETECTED NOT DETECTED Final   Staphylococcus species DETECTED (A) NOT DETECTED Final    Comment: Methicillin (oxacillin) susceptible coagulase negative staphylococcus. Possible blood culture contaminant (unless isolated from more than one blood culture draw or clinical case suggests pathogenicity). No antibiotic treatment is indicated for blood  culture contaminants. CRITICAL RESULT CALLED TO, READ BACK BY AND VERIFIED WITH: SCOTT HALL @320  12/01/2018 TTG    Staphylococcus aureus (BCID) NOT DETECTED NOT DETECTED Final   Methicillin resistance NOT DETECTED NOT DETECTED Final   Streptococcus species NOT DETECTED NOT DETECTED Final   Streptococcus agalactiae NOT DETECTED NOT DETECTED Final   Streptococcus pneumoniae NOT DETECTED NOT DETECTED Final   Streptococcus pyogenes NOT DETECTED NOT DETECTED Final   Acinetobacter baumannii NOT DETECTED NOT DETECTED Final   Enterobacteriaceae species NOT DETECTED NOT DETECTED Final   Enterobacter cloacae complex NOT DETECTED NOT DETECTED Final   Escherichia coli NOT DETECTED NOT DETECTED Final   Klebsiella oxytoca NOT DETECTED NOT DETECTED Final   Klebsiella pneumoniae NOT DETECTED NOT DETECTED Final   Proteus species NOT DETECTED NOT DETECTED Final   Serratia marcescens NOT DETECTED NOT DETECTED Final   Haemophilus influenzae NOT DETECTED NOT DETECTED Final   Neisseria meningitidis NOT DETECTED NOT DETECTED Final   Pseudomonas aeruginosa NOT DETECTED NOT DETECTED Final   Candida albicans NOT DETECTED NOT DETECTED Final   Candida glabrata NOT DETECTED NOT DETECTED Final   Candida krusei NOT DETECTED NOT DETECTED Final   Candida parapsilosis NOT DETECTED NOT DETECTED Final   Candida tropicalis NOT DETECTED NOT DETECTED Final    Comment: Performed at [facility], [address], [place], [place] [address]     Management plans discussed with the patient, family and they are in agreement.  CODE STATUS:     Code Status Orders  (From  admission, onward)         Start     Ordered   11/30/18 1112  Full code  Continuous     11/30/18 1113        Code Status History    This patient has a current code status but no historical code status.   Advance Care Planning Activity      TOTAL TIME TAKING CARE OF THIS PATIENT: 35 minutes.    Alford Highland M.D on 12/02/2018 at 4:02 PM  Between 7am to 6pm - Pager - [phone]  After 6pm go to [email or web address] - password [email or web address]  [employer] Office  [phone]  CC: Primary care physician; Alba Cory, MD

## 2018-12-02 NOTE — Progress Notes (Signed)
Discharge instructions given to patient and daughter [employer]. Patient and daughter verbalized understanding. Tele monitor off. IV taken out. Daughter picking up patient.

## 2018-12-02 NOTE — Plan of Care (Signed)

## 2018-12-02 NOTE — Progress Notes (Signed)
Cardiovascular and Pulmonary Nurse Navigator Note:   85 year old female with past medical history of HOCM EF of 55% with severe LVH, paroxysmal atrial fibrillation on Pradaxa, GERD, hypertension, thrombocytopenia, hyperlipidemia, and chronic anemia presenting to the ED with worsening shortness of breath.    BNP 411.  CXR IMPRESSION:  Pulmonary vascular congestion with interstitial and patchy alveolar edema. These are findings felt to be indicative of congestive heart failure. A degree of superimposed pneumonia in the mid lower lung zones cannot be entirely excluded. Aortic Atherosclerosis  Per patient's daughter, patient lives with her disabled son and she is the primary caregiver.  Apparently patient had been complaining of shortness of breath for a couple of days prior to admission.  She denies fevers or chills, nausea or vomiting, chest pain or recent sick contacts.  Per patient's daughter, patient's son called EMS due to worsening symptoms.  When EMS arrived patient was found to be hypoxic in the 70s therefore was placed on 2 L of nasal cannula.  Patient admitted with the following active problems:   1. Acute hypoxic respiratory failure secondary to CHF exacerbation.  Patient was initially on BiPAP.   2. Acute on chronic diastolic congestive heart failure with history of hypertrophic cardiomyopathy. Patient on Coreg,  Irbesartan and lasix.  Echo performed this admission.  Please see below.   3. Hypokalemia and hypomagnesemia which were replaced.   4. Paroxysmal atrial fibrillation - patient on Pradaxa, amiodarone, Coreg for rate control 5. Elevated troponin secondary to demand ischemia 6. Essential hypertension continue usual medications ____________________________________________________________________________  Patient Name:   Sharon Bullock Date of Exam: 11/30/2018 Medical Rec #:  [id number]    Height:       63.0 in Accession #:    [id number]   Weight:       156.2 lb Date of Birth:  Aug 11, 1933     BSA:          1.74 m Patient Age:    85 years     BP:           144/59 mmHg Patient Gender: F            HR:           65 bpm. Exam Location:  [facility]  Procedure: 2D Echo, Cardiac Doppler and Color Doppler  Indications:     I50.21 Acute Systolic CHF   History:         Patient has no prior history of Echocardiogram examinations.                  Risk Factors: Hypertension.   Sonographer:     Sedonia Small Rodgers-Jones Referring Phys:  [id number] Hubbard Hartshorn OUMA Diagnosing Phys: Alwyn Pea MD  IMPRESSIONS   1. The left ventricle has hyperdynamic systolic function, with an ejection fraction of >65%. The cavity size was normal. There is moderate concentric left ventricular hypertrophy. Left ventricular diastolic Doppler parameters are consistent with  impaired relaxation. No evidence of left ventricular regional wall motion abnormalities.  2. The right ventricle has normal systolic function. The cavity was normal. There is no increase in right ventricular wall thickness.  3. Left atrial size was severely dilated.  4. Trivial pericardial effusion is present.  5. Mildly thickened tricuspid valve leaflets.  6. The aortic valve is tricuspid. Moderate thickening of the aortic valve. Mild calcification of the aortic valve. Aortic valve regurgitation is mild by color flow Doppler. Mild stenosis of the aortic valve. Mild aortic annular calcification noted.  7. The mitral valve is grossly normal. Mild thickening of the mitral valve leaflet. Mitral valve regurgitation is moderate to severe by color flow Doppler. The MR jet is posteriorly-directed. Diastolic mitral regurgitation is present. Mild mitral valve  stenosis.  8. The tricuspid valve is grossly normal. Tricuspid valve regurgitation is mild-moderate.  9. The aorta is normal unless otherwise noted. 10. The interatrial septum was not  assessed. ______________________________________________________________________________________  EDUCATION Follow-up:   Patient sitting up on side of bed.  Patient for discharge today.   Patient informed this RN this is the first time she has had a problem with fluid / heart failure.  Patient stated she has a functioning scale at home.  Educational session with patient completed.  Patient has "Living Better with Heart Failure" booklet at her bedside. Briefly reviewed definition of heart failure and signs and symptoms of an exacerbation.?Explained to patient that HF is a chronic illness which requires self-assessment / self-management along with help from the cardiologist/PCP.  *Reviewed importance of and reason behind checking weight daily in the AM, after using the bathroom, but before getting dressed. Patient has functioning scale and verbalized understanding of why she needs to weigh herself daily.??  *Reviewed with patient the following information: *Discussed when to call the Dr= weight gain of >2-3lb overnight of 5lb in a week, *Discussed yellow zone= call MD: weight gain of >2-3lb overnight of 5lb in a week, increased swelling, increased SOB when lying down, chest discomfort, dizziness, increased fatigue *Red Zone= call 911: struggle to breath, fainting or near fainting, significant chest pain  *Instructed patient to take medications as prescribed for heart failure. Explained briefly why pt is on the medications (either make you feel better, live longer or keep you out of the hospital) and discussed monitoring and side effects.  Low sodium diet  -?Discussed low sodium foods and foods to avoid. Dietitian Consultation for 2 gm sodium diet education has been completed by dietitian.    *Discussed fluid intake with patient as well. Patient not currently on a fluid restriction, but advised no more than 64 ounces per day.  Demonstrated this amount using the bedside water pitcher.    Smoking  Cessation - Patient is a NEVER smoker.??  Exercise -?Encouraged patient to remain as active as possible. Patient is independent at home.  Encouraged patient to remain as active as possible.    [facility] - Explained the purpose of the [facility].  Patient is followed by Dr. Mina Marble, [facility].  Patient has an appointment to follow-up with Dr. Mina Marble on Tuesday, December 08, 2018 at 1 p.m.  [facility] appt was originally scheduled on that same day.  [place] [facility] rescheduled to December 31, 2018 at 10 a.m.  Patient plans to speak with her daughter-in-law and cardiologist about the [facility].  Note:  Patient does not drive and must rely on her daughter to take her to appointments.    Patient also has follow-up appt with Dr. Ancil Boozer, Family Medicine, on December 08, 2018 at 8:40 a.m.    Again, the 5 Steps to Living Better with Heart Failure were reviewed with patient.  Patient thanked me for the above information.    Roanna Epley, RN, BSN, [facility]  Cardiovascular & Pulmonary Nurse Navigator  Direct Line: [phone]  Department Phone #: [phone] Fax: [phone]  Email Address: [email or web address]

## 2018-12-02 NOTE — Progress Notes (Signed)
Pt c/o "feeling hot and sweaty". Pt temperature is 99.9. Tylenol given per pt request. On recheck, temp is unchanged. MD made aware. No new orders.

## 2018-12-05 LAB — CULTURE, BLOOD (ROUTINE X 2): Culture: NO GROWTH

## 2018-12-06 ENCOUNTER — Telehealth: Payer: Self-pay

## 2018-12-06 NOTE — Telephone Encounter (Signed)
Attempted to reach patient for TCM call and to confirm hospital follow up appt.  Left message to call me back at [phone] or contact the office.

## 2018-12-08 ENCOUNTER — Ambulatory Visit (INDEPENDENT_AMBULATORY_CARE_PROVIDER_SITE_OTHER): Payer: Medicare Other | Admitting: Family Medicine

## 2018-12-08 ENCOUNTER — Other Ambulatory Visit: Payer: Self-pay

## 2018-12-08 ENCOUNTER — Encounter: Payer: Self-pay | Admitting: Family Medicine

## 2018-12-08 ENCOUNTER — Ambulatory Visit: Payer: Medicare Other | Admitting: Family

## 2018-12-08 DIAGNOSIS — I10 Essential (primary) hypertension: Secondary | ICD-10-CM | POA: Diagnosis not present

## 2018-12-08 DIAGNOSIS — J3089 Other allergic rhinitis: Secondary | ICD-10-CM

## 2018-12-08 DIAGNOSIS — E876 Hypokalemia: Secondary | ICD-10-CM

## 2018-12-08 DIAGNOSIS — J9601 Acute respiratory failure with hypoxia: Secondary | ICD-10-CM | POA: Diagnosis not present

## 2018-12-08 DIAGNOSIS — I480 Paroxysmal atrial fibrillation: Secondary | ICD-10-CM | POA: Diagnosis not present

## 2018-12-08 DIAGNOSIS — I422 Other hypertrophic cardiomyopathy: Secondary | ICD-10-CM | POA: Diagnosis not present

## 2018-12-08 DIAGNOSIS — I340 Nonrheumatic mitral (valve) insufficiency: Secondary | ICD-10-CM | POA: Diagnosis not present

## 2018-12-08 DIAGNOSIS — I5033 Acute on chronic diastolic (congestive) heart failure: Secondary | ICD-10-CM

## 2018-12-08 MED ORDER — LORATADINE 10 MG PO TABS: 10.0000 mg | ORAL_TABLET | Freq: Every day | ORAL | 11 refills | Status: DC

## 2018-12-08 NOTE — Progress Notes (Signed)
Name: Sharon Bullock   MRN: [id number]    DOB: [birth year]   Date:12/08/2018       Progress Note  Subjective  Chief Complaint  Chief Complaint  Patient presents with  . Hospitalization Follow-up    I connected with  Sharon Bullock on 12/08/18 at  8:40 AM EDT by telephone and verified that I am speaking with the correct person using two identifiers.  I discussed the limitations, risks, security and privacy concerns of performing an evaluation and management service by telephone and the availability of in person appointments. Staff also discussed with the patient that there may be a patient responsible charge related to this service. Patient Location: Home Provider Location: Home Additional Individuals present: None  HPI  1.  Acute hypoxic respiratory secondary to CHF exacerbation.  The patient was initially on BiPAP and tapered off oxygen by the time of discharge.  She has been doing well at home - no shortness of breath.   2.  Acute on chronic diastolic congestive heart failure with history of hypertrophic cardiomyopathy.  The patient was on low-dose IV Lasix in the hospital and diuresed well.  EF greater than 65% moderate concentric left ventricular hypertrophy.  Patient discharged home on Lasix.  Patient already on Coreg and Avapro.  She does note ongoing foot swelling - goes down completely at night, swells throughout the day. Has appt with Clarisa Kindredina Hackney FNP with HF clinic.   - She has been checking daily  Weights - has not gained or lost more than 2lbs at any point. 154.5lbs, 153, 153, 154.5 over the last few days.   3.  Hypokalemia and hypomagnesemia.  The patient had replacement of potassium and magnesium during the hospital course.  Was given potassium supplementation upon going home.  Due for recheck of labs today. K+ prior to d/c was 4.4, and Mag was 2.5.  Also noted to have slightly low sodium at 134.    4.  Paroxysmal atrial fibrillation patient on anticoagulation with Pradaxa and  rate controlled with amiodarone and Coreg. Has follow up with her Cardiologist with [facility] this afternoon.  5.  Elevated troponin was attributed to demand ischemia while hospitalized.   6.  Essential hypertension.  Taking medications as prescribed; was controlled during her stay.  7.  Low-grade fever:  Patient's COVID-19 test was negative.  Looking back at chest x-ray on admission they could not rule out pneumonia, so hospitalist prescribed a course of Augmentin at home which she has finished.  1 set of blood cultures growing staph species which was considered to be likely a contamination.  Her temperatures as home have been 98.45F, 98.3, 98.4, 97.3, 98.0 over the last 5 days.  She denies chest pain or shortness of breath.   8.  AR: She notes was told could not take anything stronger than Claritin which does not work for her; she does not like nasal sprays (makes her nostrils sore and does not help).  Patient Active Problem List   Diagnosis Date Noted  . Acute respiratory failure with hypoxia (HCC) 11/30/2018  . Moderate major depression, single episode (HCC) 08/03/2018  . Thrombocytopenia (HCC) 05/27/2016  . CHF (congestive heart failure) (HCC) 05/26/2016  . Mesenteric vascular insufficiency (HCC) 02/28/2016  . Pancreas cyst 02/28/2016  . Increased endometrial stripe thickness 02/28/2016  . Carotid atherosclerosis, bilateral 01/23/2016  . Hearing loss 08/17/2015  . Mitral regurgitation 04/19/2015  . Pernicious anemia 09/27/2014  . Paroxysmal A-fib (HCC) 09/27/2014  . Benign essential HTN 09/27/2014  .  Perennial allergic rhinitis 09/27/2014  . Chronic anemia 09/27/2014  . Insomnia, persistent 09/27/2014  . Hypertensive pulmonary vascular disease (HCC) 09/27/2014  . B12 deficiency 09/27/2014  . Diverticulosis of colon 09/27/2014  . Dyslipidemia 09/27/2014  . Edema extremities 09/27/2014  . Gastric reflux 09/27/2014  . Bilateral hearing loss 09/27/2014  . Polypharmacy 09/27/2014  .  Pelvic pain in female 09/27/2014  . MI (mitral incompetence) 09/27/2014  . Internal hemorrhoids 09/27/2014  . Osteopenia 09/27/2014  . Arthralgia of shoulder 09/27/2014  . Finger tendinitis 09/27/2014  . Urge incontinence 09/27/2014  . Cervical prolapse 09/27/2014  . Vertigo 09/27/2014  . Vitamin D deficiency 09/27/2014  . Bladder cystocele 04/15/2012  . Asymmetric septal hypertrophy (HCC) 05/19/2011  . Hypertrophic cardiomyopathy (HCC) 05/19/2011    Past Surgical History:  Procedure Laterality Date  . BLADDER REPAIR  2014   tact    Family History  Problem Relation Age of Onset  . Alzheimer's disease Mother   . Hypertension Mother   . Stroke Father   . Heart disease Father   . Heart disease Brother   . Hypertension Brother   . Cancer Daughter        breast  . Hypertension Daughter   . Breast cancer Daughter   . Diabetes Brother   . Heart disease Brother   . Heart disease Brother   . Diabetes Brother   . Hypertension Brother   . Heart disease Brother   . Hypertension Brother   . Cancer Brother        prostate  . Diabetes Brother   . Heart disease Brother   . Hypertension Brother   . Cancer Brother        prostate    Social History   Socioeconomic History  . Marital status: Widowed    Spouse name: Not on file  . Number of children: 5  . Years of education: some college  . Highest education level: 12th grade  Occupational History  . Occupation: Retired  Engineer, production  . Financial resource strain: Not hard at all  . Food insecurity    Worry: Never true    Inability: Never true  . Transportation needs    Medical: No    Non-medical: No  Tobacco Use  . Smoking status: Never Smoker  . Smokeless tobacco: Never Used  . Tobacco comment: smoking cessation materials not required  Substance and Sexual Activity  . Alcohol use: No    Alcohol/week: 0.0 standard drinks  . Drug use: No  . Sexual activity: Not Currently  Lifestyle  . Physical activity    Days  per week: 0 days    Minutes per session: 0 min  . Stress: To some extent  Relationships  . Social Musician on phone: Patient refused    Gets together: Patient refused    Attends religious service: Patient refused    Active member of club or organization: Patient refused    Attends meetings of clubs or organizations: Patient refused    Relationship status: Widowed  . Intimate partner violence    Fear of current or ex partner: No    Emotionally abused: No    Physically abused: No    Forced sexual activity: No  Other Topics Concern  . Not on file  Social History Narrative   Lost husband 07/18/2017   Grown son still living with her      Current Outpatient Medications:  .  amiodarone (PACERONE) 200 MG tablet, Take 1  tablet by mouth daily., Disp: , Rfl:  .  amLODipine (NORVASC) 10 MG tablet, Take 1 tablet (10 mg total) by mouth daily., Disp: 90 tablet, Rfl: 1 .  amoxicillin-clavulanate (AUGMENTIN) 875-125 MG tablet, Take 1 tablet by mouth every 12 (twelve) hours., Disp: 10 tablet, Rfl: 0 .  azelastine (ASTELIN) 0.1 % nasal spray, Place 2 sprays into both nostrils 2 (two) times daily. Use in each nostril as directed, Disp: 30 mL, Rfl: 12 .  calcium-vitamin D (OSCAL WITH D) 500-200 MG-UNIT tablet, Take 1 tablet by mouth 2 (two) times daily., Disp: , Rfl:  .  carvedilol (COREG) 25 MG tablet, TAKE 1 TABLET(25 MG) BY MOUTH TWICE DAILY WITH A MEAL (Patient taking differently: Take 25 mg by mouth 2 (two) times daily with a meal. ), Disp: 180 tablet, Rfl: 1 .  co-enzyme Q-10 30 MG capsule, Take 30 mg by mouth daily. , Disp: , Rfl:  .  feeding supplement, ENSURE ENLIVE, (ENSURE ENLIVE) LIQD, Take 237 mLs by mouth 2 (two) times daily between meals., Disp: 14220 mL, Rfl: 0 .  ferrous gluconate (FERGON) 324 MG tablet, Take 324 mg by mouth daily with breakfast., Disp: , Rfl:  .  folic acid (FOLVITE) 973 MCG tablet, Take 400 mcg by mouth daily. , Disp: , Rfl:  .  furosemide (LASIX) 20 MG  tablet, Take 1 tablet (20 mg total) by mouth daily., Disp: 30 tablet, Rfl: 0 .  irbesartan (AVAPRO) 300 MG tablet, TAKE 1 TABLET BY MOUTH EVERY DAY FOR BLOOD PRESSURE (Patient taking differently: Take 300 mg by mouth daily. ), Disp: 90 tablet, Rfl: 1 .  omeprazole (PRILOSEC) 40 MG capsule, Take 1 capsule (40 mg total) by mouth daily., Disp: 90 capsule, Rfl: 1 .  potassium chloride SA (K-DUR) 20 MEQ tablet, Take 1 tablet (20 mEq total) by mouth daily., Disp: 30 tablet, Rfl: 0 .  PRADAXA 150 MG CAPS capsule, Take 1 capsule (150 mg total) by mouth 2 (two) times daily., Disp: 180 capsule, Rfl: 1 .  rosuvastatin (CRESTOR) 10 MG tablet, Take 1 tablet (10 mg total) by mouth daily., Disp: 90 tablet, Rfl: 1 .  solifenacin (VESICARE) 5 MG tablet, Take 1 tablet (5 mg total) by mouth daily., Disp: 30 tablet, Rfl: 2 .  Vitamin D, Cholecalciferol, 1000 UNITS TABS, Take 1,000 Units by mouth daily. , Disp: , Rfl:  .  zolpidem (AMBIEN) 5 MG tablet, Take 1 tablet (5 mg total) by mouth at bedtime as needed for sleep., Disp: 90 tablet, Rfl: 0  Current Facility-Administered Medications:  .  cyanocobalamin ((VITAMIN B-12)) injection 1,000 mcg, 1,000 mcg, Intramuscular, Q30 days, Steele Sizer, MD, 1,000 mcg at 04/23/18 1020  Allergies  Allergen Reactions  . Nickel Dermatitis and Swelling  . Other Anaphylaxis    Uncoded Allergy. Allergen: SHELLFISH  . Ace Inhibitors Cough    I personally reviewed active problem list, medication list, allergies, notes from last encounter, lab results with the patient/caregiver today.   ROS Constitutional: Negative for fever or weight change.  Respiratory: Negative for cough and shortness of breath.   HENT: +Rhinitis - see HPI Cardiovascular: Negative for chest pain or palpitations.  Gastrointestinal: Negative for abdominal pain, no bowel changes.  Musculoskeletal: Negative for gait problem. Skin: Negative for rash.  Neurological: Negative for dizziness or headache.  No  other specific complaints in a complete review of systems (except as listed in HPI above).  Objective  Virtual encounter, vitals not obtained.  There is no height or weight on  file to calculate BMI.  Physical Exam Pulmonary/Chest: Effort normal. No respiratory distress. Speaking in complete sentences. Neurological: Pt is alert and oriented to person, place, and time. Speech is normal Psychiatric: Patient has a normal mood and affect. behavior is normal. Judgment and thought content normal.  No results found for this or any previous visit (from the past 72 hour(s)).  PHQ2/9: [facility] 12/08/2018 11/05/2018 08/03/2018 04/23/2018 03/04/2018  Decreased Interest 0 0 0 0 0  Down, Depressed, Hopeless 0 0 0 0 0  PHQ - 2 Score 0 0 0 0 0  Altered sleeping 0 2 0 3 0  Tired, decreased energy 0 1 0 1 0  Change in appetite 0 2 0 0 0  Feeling bad or failure about yourself  0 0 0 1 0  Trouble concentrating 0 0 0 0 0  Moving slowly or fidgety/restless 0 0 0 3 0  Suicidal thoughts 0 0 0 0 0  PHQ-9 Score 0 5 0 8 0  Difficult doing work/chores Not difficult at all Somewhat difficult - Very difficult Not difficult at all  Some recent data might be hidden   PHQ-2/9 Result is negative.    Fall Risk: Fall Risk  12/08/2018 11/05/2018 04/23/2018 03/04/2018 01/12/2018  Falls in the past year? 0 0 0 0 No  Number falls in past yr: 0 0 0 - -  Injury with Fall? 0 0 0 0 -  Risk for fall due to : - - - - Impaired vision;Impaired balance/gait;Medication side effect;History of fall(s);Mental status change  Risk for fall due to: Comment - - - - ambualtes with cane, new pain to L extremity resulting in referral to ortho; wears eyeglasses; recent depression resulting in start of Zoloft  Follow up Falls evaluation completed - - - -    Assessment & Plan  1. Acute on chronic diastolic congestive heart failure (HCC) - Has follow up with HF clinic FNP on 12/31/18 and with her Cardiologist today - BLE  edema has improved with Lasix, but is still present.  Does go down at night, weights have been stable.  Maintain medications for the time being.  2. Acute respiratory failure with hypoxia (HCC) - CBC with Differential/Platelet  3. Hypokalemia - Magnesium - COMPLETE METABOLIC PANEL WITH GFR  4. Hypomagnesemia - Magnesium - COMPLETE METABOLIC PANEL WITH GFR  5. Paroxysmal A-fib (HCC) - Continue medications, follow up with Cardiology  6. Benign essential HTN - UTA at home; weights have been stable, she is asymptomatic aside from BLE edema.  7. Non-seasonal allergic rhinitis, unspecified trigger - Try claritin once daily; unable to tolerate nasal sprays. - loratadine (CLARITIN) 10 MG tablet; Take 1 tablet (10 mg total) by mouth daily.  Dispense: 30 tablet; Refill: 11   I discussed the assessment and treatment plan with the patient. The patient was provided an opportunity to ask questions and all were answered. The patient agreed with the plan and demonstrated an understanding of the instructions.   The patient was advised to call back or seek an in-person evaluation if the symptoms worsen or if the condition fails to improve as anticipated.  I provided 26 minutes of non-face-to-face time during this encounter.  Doren CustardEmily E , FNP

## 2018-12-09 ENCOUNTER — Other Ambulatory Visit: Payer: Self-pay

## 2018-12-10 ENCOUNTER — Other Ambulatory Visit: Payer: Self-pay

## 2018-12-10 ENCOUNTER — Inpatient Hospital Stay: Payer: Medicare Other | Attending: Oncology

## 2018-12-10 DIAGNOSIS — D649 Anemia, unspecified: Secondary | ICD-10-CM | POA: Diagnosis not present

## 2018-12-10 DIAGNOSIS — D696 Thrombocytopenia, unspecified: Secondary | ICD-10-CM

## 2018-12-10 LAB — CBC WITH DIFFERENTIAL/PLATELET
Abs Immature Granulocytes: 0.02 10*3/uL (ref 0.00–0.07)
Basophils Absolute: 0 10*3/uL (ref 0.0–0.1)
Basophils Relative: 0 %
Eosinophils Absolute: 0.1 10*3/uL (ref 0.0–0.5)
Eosinophils Relative: 1 %
HCT: 32.4 % — ABNORMAL LOW (ref 36.0–46.0)
Hemoglobin: 10.3 g/dL — ABNORMAL LOW (ref 12.0–15.0)
Immature Granulocytes: 0 %
Lymphocytes Relative: 20 %
Lymphs Abs: 1.4 10*3/uL (ref 0.7–4.0)
MCH: 30.3 pg (ref 26.0–34.0)
MCHC: 31.8 g/dL (ref 30.0–36.0)
MCV: 95.3 fL (ref 80.0–100.0)
Monocytes Absolute: 0.5 10*3/uL (ref 0.1–1.0)
Monocytes Relative: 7 %
Neutro Abs: 4.8 10*3/uL (ref 1.7–7.7)
Neutrophils Relative %: 72 %
Platelets: 154 10*3/uL (ref 150–400)
RBC: 3.4 MIL/uL — ABNORMAL LOW (ref 3.87–5.11)
RDW: 13.4 % (ref 11.5–15.5)
WBC: 6.7 10*3/uL (ref 4.0–10.5)
nRBC: 0 % (ref 0.0–0.2)

## 2018-12-23 ENCOUNTER — Other Ambulatory Visit: Payer: Self-pay | Admitting: Family Medicine

## 2018-12-23 NOTE — Telephone Encounter (Signed)
Requested medication (s) are due for refill today: 10 days  Requested medication (s) are on the active medication list: yes  Last refill: 12/02/2018  #30  0 refills  Future visit scheduled Yes 02/04/2019  Notes to clinic: Historical Provider  Requested Prescriptions  Pending Prescriptions Disp Refills   furosemide (LASIX) 20 MG tablet [Pharmacy Med Name: FUROSEMIDE 20MG  TABLETS] 30 tablet 0    Sig: TAKE 1 TABLET(20 MG) BY MOUTH DAILY     Cardiovascular:  Diuretics - Loop Failed - 12/23/2018  3:39 PM      Failed - Na in normal range and within 360 days    Sodium  Date Value Ref Range Status  12/02/2018 134 (L) 135 - 145 mmol/L Final  08/17/2015 142 134 - 144 mmol/L Final         Passed - K in normal range and within 360 days    Potassium  Date Value Ref Range Status  12/02/2018 4.4 3.5 - 5.1 mmol/L Final         Passed - Ca in normal range and within 360 days    Calcium  Date Value Ref Range Status  12/02/2018 9.1 8.9 - 10.3 mg/dL Final         Passed - Cr in normal range and within 360 days    Creat  Date Value Ref Range Status  11/05/2018 1.03 (H) 0.60 - 0.88 mg/dL Final    Comment:    For patients >70 years of age, the reference limit for Creatinine is approximately 13% higher for people identified as African-American. .    Creatinine, Ser  Date Value Ref Range Status  12/02/2018 0.98 0.44 - 1.00 mg/dL Final         Passed - Last BP in normal range    BP Readings from Last 1 Encounters:  12/02/18 (!) 132/54         Passed - Valid encounter within last 6 months    Recent Outpatient Visits          2 weeks ago Acute respiratory failure with hypoxia [facility])   [facility], FNP   1 month ago Thrombocytopenia [facility])   [facility] Steele Sizer, MD   4 months ago Benign essential HTN   [facility] Steele Sizer, MD   8 months ago Paroxysmal A-fib [facility])   [facility] Steele Sizer, MD   9 months ago Radiculitis with lower extremity symptoms   [place] [place]      Future Appointments            In 1 month Steele Sizer, MD [facility], [facility]

## 2018-12-31 ENCOUNTER — Ambulatory Visit: Payer: Medicare Other | Admitting: Family

## 2019-01-10 ENCOUNTER — Other Ambulatory Visit: Payer: Self-pay | Admitting: Family Medicine

## 2019-01-10 DIAGNOSIS — Z79899 Other long term (current) drug therapy: Secondary | ICD-10-CM

## 2019-01-10 DIAGNOSIS — I371 Nonrheumatic pulmonary valve insufficiency: Secondary | ICD-10-CM | POA: Diagnosis not present

## 2019-01-10 DIAGNOSIS — I480 Paroxysmal atrial fibrillation: Secondary | ICD-10-CM | POA: Diagnosis not present

## 2019-01-10 DIAGNOSIS — I5032 Chronic diastolic (congestive) heart failure: Secondary | ICD-10-CM | POA: Diagnosis not present

## 2019-01-10 DIAGNOSIS — I083 Combined rheumatic disorders of mitral, aortic and tricuspid valves: Secondary | ICD-10-CM | POA: Diagnosis not present

## 2019-01-10 DIAGNOSIS — I340 Nonrheumatic mitral (valve) insufficiency: Secondary | ICD-10-CM | POA: Diagnosis not present

## 2019-01-10 DIAGNOSIS — I422 Other hypertrophic cardiomyopathy: Secondary | ICD-10-CM | POA: Diagnosis not present

## 2019-01-10 DIAGNOSIS — I313 Pericardial effusion (noninflammatory): Secondary | ICD-10-CM | POA: Diagnosis not present

## 2019-01-18 ENCOUNTER — Ambulatory Visit: Payer: Medicare Other

## 2019-01-25 ENCOUNTER — Other Ambulatory Visit: Payer: Self-pay

## 2019-01-25 ENCOUNTER — Ambulatory Visit (INDEPENDENT_AMBULATORY_CARE_PROVIDER_SITE_OTHER): Payer: Medicare Other

## 2019-01-25 VITALS — BP 132/76 | HR 72 | Temp 96.9°F | Resp 16 | Ht 65.0 in | Wt 153.8 lb

## 2019-01-25 DIAGNOSIS — E538 Deficiency of other specified B group vitamins: Secondary | ICD-10-CM

## 2019-01-25 DIAGNOSIS — Z23 Encounter for immunization: Secondary | ICD-10-CM | POA: Diagnosis not present

## 2019-01-25 DIAGNOSIS — Z0000 Encounter for general adult medical examination without abnormal findings: Secondary | ICD-10-CM | POA: Diagnosis not present

## 2019-01-25 DIAGNOSIS — Z79899 Other long term (current) drug therapy: Secondary | ICD-10-CM | POA: Diagnosis not present

## 2019-01-25 DIAGNOSIS — J9601 Acute respiratory failure with hypoxia: Secondary | ICD-10-CM | POA: Diagnosis not present

## 2019-01-25 DIAGNOSIS — E876 Hypokalemia: Secondary | ICD-10-CM | POA: Diagnosis not present

## 2019-01-25 LAB — CBC WITH DIFFERENTIAL/PLATELET
Absolute Monocytes: 451 cells/uL (ref 200–950)
Basophils Absolute: 32 cells/uL (ref 0–200)
Basophils Relative: 0.6 %
Eosinophils Absolute: 69 cells/uL (ref 15–500)
Eosinophils Relative: 1.3 %
HCT: 34.1 % — ABNORMAL LOW (ref 35.0–45.0)
Hemoglobin: 11 g/dL — ABNORMAL LOW (ref 11.7–15.5)
Lymphs Abs: 1330 cells/uL (ref 850–3900)
MCH: 30.6 pg (ref 27.0–33.0)
MCHC: 32.3 g/dL (ref 32.0–36.0)
MCV: 95 fL (ref 80.0–100.0)
MPV: 11.3 fL (ref 7.5–12.5)
Monocytes Relative: 8.5 %
Neutro Abs: 3419 cells/uL (ref 1500–7800)
Neutrophils Relative %: 64.5 %
Platelets: 143 10*3/uL (ref 140–400)
RBC: 3.59 10*6/uL — ABNORMAL LOW (ref 3.80–5.10)
RDW: 12.8 % (ref 11.0–15.0)
Total Lymphocyte: 25.1 %
WBC: 5.3 10*3/uL (ref 3.8–10.8)

## 2019-01-25 LAB — COMPLETE METABOLIC PANEL WITH GFR
AG Ratio: 1.3 (calc) (ref 1.0–2.5)
ALT: 20 U/L (ref 6–29)
AST: 22 U/L (ref 10–35)
Albumin: 3.9 g/dL (ref 3.6–5.1)
Alkaline phosphatase (APISO): 75 U/L (ref 37–153)
BUN/Creatinine Ratio: 21 (calc) (ref 6–22)
BUN: 25 mg/dL (ref 7–25)
CO2: 25 mmol/L (ref 20–32)
Calcium: 9.2 mg/dL (ref 8.6–10.4)
Chloride: 106 mmol/L (ref 98–110)
Creat: 1.21 mg/dL — ABNORMAL HIGH (ref 0.60–0.88)
GFR, Est African American: 48 mL/min/{1.73_m2} — ABNORMAL LOW (ref >=60)
GFR, Est Non African American: 41 mL/min/{1.73_m2} — ABNORMAL LOW (ref >=60)
Globulin: 3.1 g/dL (calc) (ref 1.9–3.7)
Glucose, Bld: 99 mg/dL (ref 65–99)
Potassium: 4.7 mmol/L (ref 3.5–5.3)
Sodium: 138 mmol/L (ref 135–146)
Total Bilirubin: 0.4 mg/dL (ref 0.2–1.2)
Total Protein: 7 g/dL (ref 6.1–8.1)

## 2019-01-25 LAB — MAGNESIUM: Magnesium: 2.3 mg/dL (ref 1.5–2.5)

## 2019-01-25 MED ORDER — CYANOCOBALAMIN 1000 MCG/ML IJ SOLN
1000.0000 ug | Freq: Once | INTRAMUSCULAR | Status: AC
  Administered 2019-01-25: 1000 ug via INTRAMUSCULAR

## 2019-01-25 NOTE — Patient Instructions (Signed)
[name] , Thank you for taking time to come for your Medicare Wellness Visit. I appreciate your ongoing commitment to your health goals. Please review the following plan we discussed and let me know if I can assist you in the future.   Screening recommendations/referrals: Colonoscopy: no longer required Mammogram: no longer required Bone Density: no longer required Recommended yearly ophthalmology/optometry visit for glaucoma screening and checkup Recommended yearly dental visit for hygiene and checkup  Vaccinations: Influenza vaccine: done today Pneumococcal vaccine: done 04/19/15 Tdap vaccine: done 08/27/09 Shingles vaccine: Shingrix discussed. Please contact your pharmacy for coverage information.   Advanced directives: Please bring a copy of your health care power of attorney and living will to the office at your convenience.  Conditions/risks identified: Recommend preventing falls   Next appointment: Please follow up in one year for your Medicare Annual Wellness visit.     Preventive Care 6165 Years and Older, Female Preventive care refers to lifestyle choices and visits with your health care provider that can promote health and wellness. What does preventive care include?  A yearly physical exam. This is also called an annual well check.  Dental exams once or twice a year.  Routine eye exams. Ask your health care provider how often you should have your eyes checked.  Personal lifestyle choices, including:  Daily care of your teeth and gums.  Regular physical activity.  Eating a healthy diet.  Avoiding tobacco and drug use.  Limiting alcohol use.  Practicing safe sex.  Taking low-dose aspirin every day.  Taking vitamin and mineral supplements as recommended by your health care provider. What happens during an annual well check? The services and screenings done by your health care provider during your annual well check will depend on your age, overall health,  lifestyle risk factors, and family history of disease. Counseling  Your health care provider may ask you questions about your:  Alcohol use.  Tobacco use.  Drug use.  Emotional well-being.  Home and relationship well-being.  Sexual activity.  Eating habits.  History of falls.  Memory and ability to understand (cognition).  Work and work Astronome[employer]vironment.  Reproductive health. Screening  You may have the following tests or measurements:  Height, weight, and BMI.  Blood pressure.  Lipid and cholesterol levels. These may be checked every 5 years, or more frequently if you are over 83 years old.  Skin check.  Lung cancer screening. You may have this screening every year starting at age 83 if you have a 30-pack-year history of smoking and currently smoke or have quit within the past 15 years.  Fecal occult blood test (FOBT) of the stool. You may have this test every year starting at age 83.  Flexible sigmoidoscopy or colonoscopy. You may have a sigmoidoscopy every 5 years or a colonoscopy every 10 years starting at age 83.  Hepatitis C blood test.  Hepatitis B blood test.  Sexually transmitted disease (STD) testing.  Diabetes screening. This is done by checking your blood sugar (glucose) after you have not eaten for a while (fasting). You may have this done every 1-3 years.  Bone density scan. This is done to screen for osteoporosis. You may have this done starting at age 83.  Mammogram. This may be done every 1-2 years. Talk to your health care provider about how often you should have regular mammograms. Talk with your health care provider about your test results, treatment options, and if necessary, the need for more tests. Vaccines  Your health  care provider may recommend certain vaccines, such as:  Influenza vaccine. This is recommended every year.  Tetanus, diphtheria, and acellular pertussis (Tdap, Td) vaccine. You may need a Td booster every 10 years.  Zoster  vaccine. You may need this after age 41.  Pneumococcal 13-valent conjugate (PCV13) vaccine. One dose is recommended after age 39.  Pneumococcal polysaccharide (PPSV23) vaccine. One dose is recommended after age 40. Talk to your health care provider about which screenings and vaccines you need and how often you need them. This information is not intended to replace advice given to you by your health care provider. Make sure you discuss any questions you have with your health care provider. Document Released: 04/27/2015 Document Revised: 12/19/2015 Document Reviewed: 01/30/2015 Elsevier Interactive Patient Education  2017 [place] Prevention in the Home Falls can cause injuries. They can happen to people of all ages. There are many things you can do to make your home safe and to help prevent falls. What can I do on the outside of my home?  Regularly fix the edges of walkways and driveways and fix any cracks.  Remove anything that might make you trip as you walk through a door, such as a raised step or threshold.  Trim any bushes or trees on the path to your home.  Use bright outdoor lighting.  Clear any walking paths of anything that might make someone trip, such as rocks or tools.  Regularly check to see if handrails are loose or broken. Make sure that both sides of any steps have handrails.  Any raised decks and porches should have guardrails on the edges.  Have any leaves, snow, or ice cleared regularly.  Use sand or salt on walking paths during winter.  Clean up any spills in your garage right away. This includes oil or grease spills. What can I do in the bathroom?  Use night lights.  Install grab bars by the toilet and in the tub and shower. Do not use towel bars as grab bars.  Use non-skid mats or decals in the tub or shower.  If you need to sit down in the shower, use a plastic, non-slip stool.  Keep the floor dry. Clean up any water that spills on the  floor as soon as it happens.  Remove soap buildup in the tub or shower regularly.  Attach bath mats securely with double-sided non-slip rug tape.  Do not have throw rugs and other things on the floor that can make you trip. What can I do in the bedroom?  Use night lights.  Make sure that you have a light by your bed that is easy to reach.  Do not use any sheets or blankets that are too big for your bed. They should not hang down onto the floor.  Have a firm chair that has side arms. You can use this for support while you get dressed.  Do not have throw rugs and other things on the floor that can make you trip. What can I do in the kitchen?  Clean up any spills right away.  Avoid walking on wet floors.  Keep items that you use a lot in easy-to-reach places.  If you need to reach something above you, use a strong step stool that has a grab bar.  Keep electrical cords out of the way.  Do not use floor polish or wax that makes floors slippery. If you must use wax, use non-skid floor wax.  Do not have  throw rugs and other things on the floor that can make you trip. What can I do with my stairs?  Do not leave any items on the stairs.  Make sure that there are handrails on both sides of the stairs and use them. Fix handrails that are broken or loose. Make sure that handrails are as long as the stairways.  Check any carpeting to make sure that it is firmly attached to the stairs. Fix any carpet that is loose or worn.  Avoid having throw rugs at the top or bottom of the stairs. If you do have throw rugs, attach them to the floor with carpet tape.  Make sure that you have a light switch at the top of the stairs and the bottom of the stairs. If you do not have them, ask someone to add them for you. What else can I do to help prevent falls?  Wear shoes that:  Do not have high heels.  Have rubber bottoms.  Are comfortable and fit you well.  Are closed at the toe. Do not wear  sandals.  If you use a stepladder:  Make sure that it is fully opened. Do not climb a closed stepladder.  Make sure that both sides of the stepladder are locked into place.  Ask someone to hold it for you, if possible.  Clearly mark and make sure that you can see:  Any grab bars or handrails.  First and last steps.  Where the edge of each step is.  Use tools that help you move around (mobility aids) if they are needed. These include:  Canes.  Walkers.  Scooters.  Crutches.  Turn on the lights when you go into a dark area. Replace any light bulbs as soon as they burn out.  Set up your furniture so you have a clear path. Avoid moving your furniture around.  If any of your floors are uneven, fix them.  If there are any pets around you, be aware of where they are.  Review your medicines with your doctor. Some medicines can make you feel dizzy. This can increase your chance of falling. Ask your doctor what other things that you can do to help prevent falls. This information is not intended to replace advice given to you by your health care provider. Make sure you discuss any questions you have with your health care provider. Document Released: 01/25/2009 Document Revised: 09/06/2015 Document Reviewed: 05/05/2014 Elsevier Interactive Patient Education  2017 [place].   Influenza (Flu) Vaccine (Inactivated or Recombinant): What You Need to Know 1. Why get vaccinated? Influenza vaccine can prevent influenza (flu). Flu is a contagious disease that spreads around the [place] every year, usually between October and May. Anyone can get the flu, but it is more dangerous for some people. Infants and young children, people 62 years of age and older, pregnant women, and people with certain health conditions or a weakened immune system are at greatest risk of flu complications. Pneumonia, bronchitis, sinus infections and ear infections are examples of flu-related  complications. If you have a medical condition, such as heart disease, cancer or diabetes, flu can make it worse. Flu can cause fever and chills, sore throat, muscle aches, fatigue, cough, headache, and runny or stuffy nose. Some people may have vomiting and diarrhea, though this is more common in children than adults. Each year thousands of people in the [place] die from flu, and many more are hospitalized. Flu vaccine prevents millions of illnesses and  flu-related visits to the doctor each year. 2. Influenza vaccine CDC recommends everyone 35 months of age and older get vaccinated every flu season. Children 6 months through 16 years of age may need 2 doses during a single flu season. Everyone else needs only 1 dose each flu season. It takes about 2 weeks for protection to develop after vaccination. There are many flu viruses, and they are always changing. Each year a new flu vaccine is made to protect against three or four viruses that are likely to cause disease in the upcoming flu season. Even when the vaccine doesn't exactly match these viruses, it may still provide some protection. Influenza vaccine does not cause flu. Influenza vaccine may be given at the same time as other vaccines. 3. Talk with your health care provider Tell your vaccine provider if the person getting the vaccine:  Has had an allergic reaction after a previous dose of influenza vaccine, or has any severe, life-threatening allergies.  Has ever had Guillain-Barr Syndrome (also called GBS). In some cases, your health care provider may decide to postpone influenza vaccination to a future visit. People with minor illnesses, such as a cold, may be vaccinated. People who are moderately or severely ill should usually wait until they recover before getting influenza vaccine. Your health care provider can give you more information. 4. Risks of a vaccine reaction  Soreness, redness, and swelling where shot is given, fever,  muscle aches, and headache can happen after influenza vaccine.  There may be a very small increased risk of Guillain-Barr Syndrome (GBS) after inactivated influenza vaccine (the flu shot). Young children who get the flu shot along with pneumococcal vaccine (PCV13), and/or DTaP vaccine at the same time might be slightly more likely to have a seizure caused by fever. Tell your health care provider if a child who is getting flu vaccine has ever had a seizure. People sometimes faint after medical procedures, including vaccination. Tell your provider if you feel dizzy or have vision changes or ringing in the ears. As with any medicine, there is a very remote chance of a vaccine causing a severe allergic reaction, other serious injury, or death. 5. What if there is a serious problem? An allergic reaction could occur after the vaccinated person leaves the clinic. If you see signs of a severe allergic reaction (hives, swelling of the face and throat, difficulty breathing, a fast heartbeat, dizziness, or weakness), call 9-1-1 and get the person to the nearest hospital. For other signs that concern you, call your health care provider. Adverse reactions should be reported to the Vaccine Adverse Event Reporting System (VAERS). Your health care provider will usually file this report, or you can do it yourself. Visit the VAERS website at www.vaers.LAgents.no or call 770-532-5994.VAERS is only for reporting reactions, and VAERS staff do not give medical advice. 6. The National Vaccine Injury Compensation Program The [employer] Vaccine Injury Compensation Program (VICP) is a federal program that was created to compensate people who may have been injured by certain vaccines. Visit the VICP website at SpiritualWord.at or call 480 162 2734 to learn about the program and about filing a claim. There is a time limit to file a claim for compensation. 7. How can I learn more?  Ask your healthcare provider.   Call your local or state health department.  Contact the Centers for Disease Control and Prevention (CDC): ? Call 308-315-0863 (1-800-CDC-INFO) or ? Visit CDC's [employer]Room.[employer].cy Vaccine Information Statement (Interim) Inactivated Influenza Vaccine (11/26/2017) This information is not intended  to replace advice given to you by your health care provider. Make sure you discuss any questions you have with your health care provider. Document Released: 01/23/2006 Document Revised: 07/20/2018 Document Reviewed: 11/30/2017 Elsevier Patient Education  2020 [employer].

## 2019-01-25 NOTE — Progress Notes (Signed)
Subjective:   Sharon Bullock is a 85 y.o. female who presents for Medicare Annual (Subsequent) preventive examination.  Review of Systems:   Cardiac Risk Factors include: advanced age (>2155men, 16>65 women);hypertension     Objective:     Vitals: BP 132/76 (BP Location: Left Arm, Patient Position: Sitting, Cuff Size: Normal)   Pulse 72   Temp (!) 96.9 F (36.1 C) (Temporal)   Resp 16   Ht 5\' 5"  (1.651 m)   Wt 153 lb 12.8 oz (69.8 kg)   SpO2 99%   BMI 25.59 kg/m   Body mass index is 25.59 kg/m.  Advanced Directives 01/25/2019 11/30/2018 11/30/2018 06/11/2018 04/19/2018 01/12/2018 12/11/2016  Does Patient Have a Medical Advance Directive? Yes No No Yes Yes Yes Yes  Type of Estate agentAdvance Directive Healthcare Power of [place];Living will - - Healthcare Power of [place];Living will Living will Healthcare Power of [place];Living will Living will  Does patient want to make changes to medical advance directive? - - - No - Patient declined No - Patient declined - -  Copy of Healthcare Power of Attorney in Chart? No - copy requested - - No - copy requested - No - copy requested -  Would patient like information on creating a medical advance directive? - No - Patient declined No - Patient declined - - - -    Tobacco Social History   Tobacco Use  Smoking Status Never Smoker  Smokeless Tobacco Never Used  Tobacco Comment   smoking cessation materials not required     Counseling given: Not Answered Comment: smoking cessation materials not required   Clinical Intake:  Pre-visit preparation completed: Yes  Pain : No/denies pain     BMI - recorded: 25.59 Nutritional Status: BMI 25 -29 Overweight Nutritional Risks: None Diabetes: No  How often do you need to have someone help you when you read instructions, pamphlets, or other written materials from your doctor or pharmacy?: 1 - Never  Interpreter Needed?: No  Information entered by :: Reather LittlerKasey Shaunta Oncale LPN  Past Medical History:   Diagnosis Date  . Allergy   . Enlarged heart 2000  . GERD (gastroesophageal reflux disease)   . Hypertension   . Insomnia   . Vertigo 2006   Past Surgical History:  Procedure Laterality Date  . BLADDER REPAIR  2014   tact   Family History  Problem Relation Age of Onset  . Alzheimer's disease Mother   . Hypertension Mother   . Stroke Father   . Heart disease Father   . Heart disease Brother   . Hypertension Brother   . Cancer Daughter        breast  . Hypertension Daughter   . Breast cancer Daughter   . Diabetes Brother   . Heart disease Brother   . Heart disease Brother   . Diabetes Brother   . Hypertension Brother   . Heart disease Brother   . Hypertension Brother   . Cancer Brother        prostate  . Diabetes Brother   . Heart disease Brother   . Hypertension Brother   . Cancer Brother        prostate   Social History   Socioeconomic History  . Marital status: Widowed    Spouse name: Not on file  . Number of children: 5  . Years of education: some college  . Highest education level: 12th grade  Occupational History  . Occupation: Retired  Engineer, productionocial Needs  . Physicist, medicalinancial resource  strain: Somewhat hard  . Food insecurity    Worry: Never true    Inability: Never true  . Transportation needs    Medical: No    Non-medical: No  Tobacco Use  . Smoking status: Never Smoker  . Smokeless tobacco: Never Used  . Tobacco comment: smoking cessation materials not required  Substance and Sexual Activity  . Alcohol use: No    Alcohol/week: 0.0 standard drinks  . Drug use: No  . Sexual activity: Not Currently  Lifestyle  . Physical activity    Days per week: 0 days    Minutes per session: 0 min  . Stress: To some extent  Relationships  . Social Musicianconnections    Talks on phone: Patient refused    Gets together: Patient refused    Attends religious service: Patient refused    Active member of club or organization: Patient refused    Attends meetings of clubs or  organizations: Patient refused    Relationship status: Widowed  Other Topics Concern  . Not on file  Social History Narrative   Lost husband 07/18/2017   Grown son still living with her     Outpatient Encounter Medications as of 01/25/2019  Medication Sig  . amiodarone (PACERONE) 200 MG tablet Take 1 tablet by mouth daily.  Marland Kitchen. amLODipine (NORVASC) 10 MG tablet Take 1 tablet (10 mg total) by mouth daily.  . calcium-vitamin D (OSCAL WITH D) 500-200 MG-UNIT tablet Take 1 tablet by mouth 2 (two) times daily.  . carvedilol (COREG) 25 MG tablet TAKE 1 TABLET(25 MG) BY MOUTH TWICE DAILY WITH A MEAL (Patient taking differently: Take 25 mg by mouth 2 (two) times daily with a meal. )  . co-enzyme Q-10 30 MG capsule Take 30 mg by mouth daily.   Marland Kitchen. ELIQUIS 5 MG TABS tablet Take 1 tablet by mouth 2 (two) times daily.  . feeding supplement, ENSURE ENLIVE, (ENSURE ENLIVE) LIQD Take 237 mLs by mouth 2 (two) times daily between meals.  . ferrous gluconate (FERGON) 324 MG tablet Take 324 mg by mouth daily with breakfast.  . folic acid (FOLVITE) 400 MCG tablet Take 400 mcg by mouth daily.   . irbesartan (AVAPRO) 300 MG tablet TAKE 1 TABLET BY MOUTH EVERY DAY FOR BLOOD PRESSURE (Patient taking differently: Take 300 mg by mouth daily. )  . loratadine (CLARITIN) 10 MG tablet Take 1 tablet (10 mg total) by mouth daily.  Marland Kitchen. omeprazole (PRILOSEC) 40 MG capsule Take 1 capsule (40 mg total) by mouth daily.  . rosuvastatin (CRESTOR) 10 MG tablet Take 1 tablet (10 mg total) by mouth daily.  Marland Kitchen. spironolactone (ALDACTONE) 25 MG tablet Take 1 tablet by mouth daily.  . Vitamin D, Cholecalciferol, 1000 UNITS TABS Take 1,000 Units by mouth daily.   Marland Kitchen. zolpidem (AMBIEN) 5 MG tablet Take 1 tablet (5 mg total) by mouth at bedtime as needed for sleep.  . furosemide (LASIX) 20 MG tablet TAKE 1 TABLET(20 MG) BY MOUTH DAILY (Patient not taking: Reported on 01/25/2019)  . potassium chloride SA (K-DUR) 20 MEQ tablet Take 1 tablet (20  mEq total) by mouth daily. (Patient not taking: Reported on 01/25/2019)  . solifenacin (VESICARE) 5 MG tablet Take 1 tablet (5 mg total) by mouth daily. (Patient not taking: Reported on 01/25/2019)  . [DISCONTINUED] amoxicillin-clavulanate (AUGMENTIN) 875-125 MG tablet Take 1 tablet by mouth every 12 (twelve) hours.  . [DISCONTINUED] azelastine (ASTELIN) 0.1 % nasal spray Place 2 sprays into both nostrils 2 (two) times daily.  Use in each nostril as directed  . [DISCONTINUED] PRADAXA 150 MG CAPS capsule Take 1 capsule (150 mg total) by mouth 2 (two) times daily.   Facility-Administered Encounter Medications as of 01/25/2019  Medication  . cyanocobalamin ((VITAMIN B-12)) injection 1,000 mcg  . [COMPLETED] cyanocobalamin ((VITAMIN B-12)) injection 1,000 mcg    Activities of Daily Living In your present state of health, do you have any difficulty performing the following activities: 01/25/2019 11/30/2018  Hearing? Y N  Comment declines hearing aids -  Vision? N N  Difficulty concentrating or making decisions? N N  Walking or climbing stairs? N N  Dressing or bathing? N N  Doing errands, shopping? N N  Preparing Food and eating ? N -  Using the Toilet? N -  In the past six months, have you accidently leaked urine? Y -  Comment urge incontinence; wears pads for protection at night -  Do you have problems with loss of bowel control? N -  Managing your Medications? N -  Managing your Finances? N -  Housekeeping or managing your Housekeeping? N -  Some recent data might be hidden    Patient Care Team: Steele Sizer, MD as PCP - General (Family Medicine) Derinda Sis, MD as Consulting Physician (Internal Medicine) Cathi Roan, [facility] as Pharmacist (Pharmacist) Benedetto Goad, RN as Case Manager    Assessment:   This is a routine wellness examination for [place].  Exercise Activities and Dietary recommendations Current Exercise Habits: The patient does not participate in regular  exercise at present, Exercise limited by: cardiac condition(s)  Goals    . "I migiht need help paying my house payment and other bills because money is tight now" (pt-stated)     Patient reports financial difficulties and is willing to discuss resources/help with social work or case management team.   Social Work Recommendations:   Insurance underwriter [phone])- assist with budgeting, debt management, mortgage Optician, dispensing [phone]); [facility] [phone]) - assist with debt management, budgeting  Contact personal [employer] to filed for hardship due to death of spouse  [facility] [phone]) - immediate limited short term financial support if patient qualifies   Clinical Goals: Over the next 7 days, patient will reach out to organizations listed above to inquire about  available resources for financial support.   Interventions: Consultation with [facility] LCSW, outreach to patient/son to provide contact information     . "I need help with depression after losing my husband" (pt-stated)     Patient states she is in grief and feeling depressed about losing her husband of 40 years recently. She began seeing a counselor via [employer] employee assistance program and is scheduled to go back for a session tomorrow. She discussed her depression with Dr. Ancil Boozer today who has prescribed Sertraline.   Clinical Goals: Over the next 14 days, patient will report ongoing work with therapist associated with [facility] and patient will report adherence to new medication as prescribed.   Interventions: Discussed patient resources re: grief support. Scheduled follow up assessment.      . Prevent falls     Recommend to remove any items from the home that may cause slips or trips.       Fall Risk Fall Risk  01/25/2019 12/08/2018 11/05/2018 04/23/2018  03/04/2018  Falls in the past year? 0 0 0 0 0  Number falls in past yr: 0 0 0 0 -  Injury with Fall? 0 0 0 0 0  Risk for fall due to : - - - - -  Risk for fall due to: Comment - - - - -  Follow up Falls prevention discussed Falls evaluation completed - - -   FALL RISK PREVENTION PERTAINING TO THE HOME:  Any stairs in or around the home? Yes  If so, do they handrails? Yes   Home free of loose throw rugs in walkways, pet beds, electrical cords, etc? Yes  Adequate lighting in your home to reduce risk of falls? Yes   ASSISTIVE DEVICES UTILIZED TO PREVENT FALLS:  Life alert? Yes  Use of a cane, walker or w/c? Yes  Grab bars in the bathroom? No  Shower chair or bench in shower? Yes  Elevated toilet seat or a handicapped toilet? No   DME ORDERS:  DME order needed?  No   TIMED UP AND GO:  Was the test performed? Yes .  Length of time to ambulate 10 feet: 6 sec.   GAIT:  Appearance of gait: Gait stead-fast and without the use of an assistive device.  Education: Fall risk prevention has been discussed.  Intervention(s) required? No   Depression Screen PHQ 2/9 Scores 01/25/2019 12/08/2018 11/05/2018 08/03/2018  PHQ - 2 Score 0 0 0 0  PHQ- 9 Score - 0 5 0     Cognitive Function     6CIT Screen 01/12/2018  What Year? 0 points  What month? 0 points  What time? 0 points  Count back from 20 0 points  Months in reverse 0 points  Repeat phrase 2 points  Total Score 2    Immunization History  Administered Date(s) Administered  . Fluad Quad(high Dose 65+) 01/25/2019  . Influenza Nasal 01/13/2016  . Influenza Split 04/13/2008, 01/18/2009, 12/03/2009  . Influenza, High Dose Seasonal PF 01/03/2015, 01/23/2016, 01/01/2017, 12/18/2017  . Influenza, Seasonal, Injecte, Preservative Fre 12/25/2010, 01/19/2012  . Influenza,inj,Quad PF,6+ Mos 12/27/2012, 01/24/2014  . Influenza-Unspecified 12/25/2010, 12/27/2012  . Pneumococcal Conjugate-13 01/24/2014, 04/19/2015  . Pneumococcal  Polysaccharide-23 08/27/2009, 01/24/2014  . Td 08/27/2009  . Tdap 08/27/2009    Qualifies for Shingles Vaccine? Yes . Due for Shingrix. Education has been provided regarding the importance of this vaccine. Pt has been advised to call insurance company to determine out of pocket expense. Advised may also receive vaccine at local pharmacy or Health Dept. Verbalized acceptance and understanding.  Tdap: Up to date  Flu Vaccine: Due for Flu vaccine. Does the patient want to receive this vaccine today?  Yes . Education has been provided regarding the importance of this vaccine but still declined. Advised may receive this vaccine at local pharmacy or Health Dept. Aware to provide a copy of the vaccination record if obtained from local pharmacy or Health Dept. Verbalized acceptance and understanding.  Pneumococcal Vaccine: Up to date    Screening Tests Health Maintenance  Topic Date Due  . INFLUENZA VACCINE  11/13/2018  . TETANUS/TDAP  08/28/2019  . DEXA SCAN  Completed  . PNA vac Low Risk Adult  Completed    Cancer Screenings:  Colorectal Screening: No longer required.   Mammogram: No longer required.   Bone Density: Not completed, no longer required.   Lung Cancer Screening: (Low Dose CT Chest recommended if Age 71-80 years, 30 pack-year currently smoking OR have quit w/in 15years.) does not qualify.    Additional Screening:  Hepatitis C Screening: no longer required  Vision Screening: Recommended annual ophthalmology exams for early detection  of glaucoma and other disorders of the eye. Is the patient up to date with their annual eye exam?  No  Who is the provider or what is the name of the office in which the pt attends annual eye exams? [facility]  Dental Screening: Recommended annual dental exams for proper oral hygiene  Community Resource Referral:  CRR required this visit?  No      Plan:     I have personally reviewed and addressed the Medicare Annual  Wellness questionnaire and have noted the following in the patient's chart:  A. Medical and social history B. Use of alcohol, tobacco or illicit drugs  C. Current medications and supplements D. Functional ability and status E.  Nutritional status F.  Physical activity G. Advance directives H. List of other physicians I.  Hospitalizations, surgeries, and ER visits in previous 12 months J.  Vitals K. Screenings such as hearing and vision if needed, cognitive and depression L. Referrals and appointments   In addition, I have reviewed and discussed with patient certain preventive protocols, quality metrics, and best practice recommendations. A written personalized care plan for preventive services as well as general preventive health recommendations were provided to patient.   Signed,  Reather Littler, LPN Nurse Health Advisor   Nurse Notes: flu shot and B12 administered today. Pt's medications recently changed by cardiology. Advised patient to bring in all meds to next appointment on 02/04/19 for accurate verification due to change in diuretic and discontinuation of potassium.

## 2019-02-04 ENCOUNTER — Other Ambulatory Visit: Payer: Self-pay

## 2019-02-04 ENCOUNTER — Ambulatory Visit (INDEPENDENT_AMBULATORY_CARE_PROVIDER_SITE_OTHER): Payer: Medicare Other | Admitting: Family Medicine

## 2019-02-04 ENCOUNTER — Encounter: Payer: Self-pay | Admitting: Family Medicine

## 2019-02-04 VITALS — BP 108/72 | HR 72 | Temp 96.8°F | Resp 16 | Ht 65.0 in | Wt 154.6 lb

## 2019-02-04 DIAGNOSIS — E785 Hyperlipidemia, unspecified: Secondary | ICD-10-CM

## 2019-02-04 DIAGNOSIS — I10 Essential (primary) hypertension: Secondary | ICD-10-CM | POA: Diagnosis not present

## 2019-02-04 DIAGNOSIS — K219 Gastro-esophageal reflux disease without esophagitis: Secondary | ICD-10-CM

## 2019-02-04 DIAGNOSIS — D692 Other nonthrombocytopenic purpura: Secondary | ICD-10-CM | POA: Diagnosis not present

## 2019-02-04 DIAGNOSIS — K551 Chronic vascular disorders of intestine: Secondary | ICD-10-CM | POA: Diagnosis not present

## 2019-02-04 DIAGNOSIS — G4700 Insomnia, unspecified: Secondary | ICD-10-CM | POA: Diagnosis not present

## 2019-02-04 DIAGNOSIS — I422 Other hypertrophic cardiomyopathy: Secondary | ICD-10-CM

## 2019-02-04 DIAGNOSIS — I5032 Chronic diastolic (congestive) heart failure: Secondary | ICD-10-CM

## 2019-02-04 DIAGNOSIS — I480 Paroxysmal atrial fibrillation: Secondary | ICD-10-CM | POA: Diagnosis not present

## 2019-02-04 MED ORDER — ZOLPIDEM TARTRATE 5 MG PO TABS: 5.0000 mg | ORAL_TABLET | Freq: Every evening | ORAL | 0 refills | Status: DC | PRN

## 2019-02-04 NOTE — Progress Notes (Signed)
Name: Sharon Bullock   MRN: [id number]    DOB: 07-Jul-1933   Date:02/04/2019       Progress Note  Subjective  Chief Complaint  Chief Complaint  Patient presents with  . Medication Refill    3 month F/U  . Hypertension  . Nocturia    Has to get up throughout the night and interrupts her sleep   . Nasal Congestion    Onset-2 weeks, clear congestion, that makes her weak and sob. nasal drainage  . Atrial Fibrillation  . Insomnia    HPI  HTN: chest pain, SOB or palpitation.BP is towards low end of normal today, at home is within normal range   Carotid atherosclerosis: found during MRI head done at [facility] for evaluation of vertigo in 12/2015. On medical management at this time. She also has mesenteric insufficiency, she denies abdominal pain or blood in stools.Taking Crestor and switched from Pradaxa to Eliquis  no side effects of medications  Afib/CHF : she sees a cardiologist - Dr. Regino Schultze at [facility]. She has been on Pradaxa, shehas occasional fluttering ( she states only when has significant allergy symptoms)she has lower extremityedema, she was admitted in August with actue exacerbation of CHF and is now on spironlactone She had an echo that showed ventricular hypertrophy and normal EF, diastolic CHF  Insomnia: She tried Trazodone without help, only able to sleep one hour per night,she is on Ambien 10 mg, tried Ambien 5 mg without much help,but insurance stopped paying for it, so she is back on 5 mg. She needs a refill   Hypertrophic cardiomyopathy and hypertensive pulmonary vascular disease: she sees Dr. Regino Schultze, no chest pain, SOB orparoxysmal nocturnal dyspnea, the bilateral swellingon legs is stable, better in am and worse at night.She is on ARB, beta-blocker and Eliquis instead of Pradaxa - it was switched by Dr. Regino Schultze since last admission 11/2018  Dyslipidemia: she is on Crestor and CoQ 10, denies side effects of medication.Unchanged   Thrombocytopenia: seeing  hematologist-she went back to Dr. Smith Robert 05/2018 , she is still getting B12 injections because of history of pernicious anemia. She also has normocitic anemia, she denies fatigue. Last platelets was back to normal  MDD: severe episode last year that lasted months, she is doing very well now, in remission, seems to be doing better.   Nocturia: history of bladder tac and has notice urinary frequency again, she is not taking Vesicare, she is now taking spironolactone and it does not help with symptoms.    Patient Active Problem List   Diagnosis Date Noted  . Acute respiratory failure with hypoxia (HCC) 11/30/2018  . Moderate major depression, single episode (HCC) 08/03/2018  . Thrombocytopenia (HCC) 05/27/2016  . CHF (congestive heart failure) (HCC) 05/26/2016  . Mesenteric vascular insufficiency (HCC) 02/28/2016  . Pancreas cyst 02/28/2016  . Increased endometrial stripe thickness 02/28/2016  . Carotid atherosclerosis, bilateral 01/23/2016  . Hearing loss 08/17/2015  . Mitral regurgitation 04/19/2015  . Pernicious anemia 09/27/2014  . Paroxysmal A-fib (HCC) 09/27/2014  . Benign essential HTN 09/27/2014  . Perennial allergic rhinitis 09/27/2014  . Chronic anemia 09/27/2014  . Insomnia, persistent 09/27/2014  . Hypertensive pulmonary vascular disease (HCC) 09/27/2014  . B12 deficiency 09/27/2014  . Diverticulosis of colon 09/27/2014  . Dyslipidemia 09/27/2014  . Edema extremities 09/27/2014  . Gastric reflux 09/27/2014  . Bilateral hearing loss 09/27/2014  . Polypharmacy 09/27/2014  . Pelvic pain in female 09/27/2014  . MI (mitral incompetence) 09/27/2014  . Internal hemorrhoids 09/27/2014  .  Osteopenia 09/27/2014  . Arthralgia of shoulder 09/27/2014  . Finger tendinitis 09/27/2014  . Urge incontinence 09/27/2014  . Cervical prolapse 09/27/2014  . Vertigo 09/27/2014  . Vitamin D deficiency 09/27/2014  . Bladder cystocele 04/15/2012  . Asymmetric septal hypertrophy (HCC)  05/19/2011  . Hypertrophic cardiomyopathy (HCC) 05/19/2011    Past Surgical History:  Procedure Laterality Date  . BLADDER REPAIR  2014   tact    Family History  Problem Relation Age of Onset  . Alzheimer's disease Mother   . Hypertension Mother   . Stroke Father   . Heart disease Father   . Heart disease Brother   . Hypertension Brother   . Cancer Daughter        breast  . Hypertension Daughter   . Breast cancer Daughter   . Diabetes Brother   . Heart disease Brother   . Heart disease Brother   . Diabetes Brother   . Hypertension Brother   . Heart disease Brother   . Hypertension Brother   . Cancer Brother        prostate  . Diabetes Brother   . Heart disease Brother   . Hypertension Brother   . Cancer Brother        prostate    Social History   Socioeconomic History  . Marital status: Widowed    Spouse name: Not on file  . Number of children: 5  . Years of education: some college  . Highest education level: 12th grade  Occupational History  . Occupation: Retired  Engineer, productionocial Needs  . Financial resource strain: Somewhat hard  . Food insecurity    Worry: Never true    Inability: Never true  . Transportation needs    Medical: No    Non-medical: No  Tobacco Use  . Smoking status: Never Smoker  . Smokeless tobacco: Never Used  . Tobacco comment: smoking cessation materials not required  Substance and Sexual Activity  . Alcohol use: No    Alcohol/week: 0.0 standard drinks  . Drug use: No  . Sexual activity: Not Currently  Lifestyle  . Physical activity    Days per week: 0 days    Minutes per session: 0 min  . Stress: To some extent  Relationships  . Social Musicianconnections    Talks on phone: Patient refused    Gets together: Patient refused    Attends religious service: Patient refused    Active member of club or organization: Patient refused    Attends meetings of clubs or organizations: Patient refused    Relationship status: Widowed  . Intimate  partner violence    Fear of current or ex partner: No    Emotionally abused: No    Physically abused: No    Forced sexual activity: No  Other Topics Concern  . Not on file  Social History Narrative   Lost husband 07/18/2017   Grown son still living with her      Current Outpatient Medications:  .  amiodarone (PACERONE) 200 MG tablet, Take 1 tablet by mouth daily., Disp: , Rfl:  .  amLODipine (NORVASC) 10 MG tablet, Take 1 tablet (10 mg total) by mouth daily., Disp: 90 tablet, Rfl: 1 .  calcium-vitamin D (OSCAL WITH D) 500-200 MG-UNIT tablet, Take 1 tablet by mouth 2 (two) times daily., Disp: , Rfl:  .  carvedilol (COREG) 25 MG tablet, TAKE 1 TABLET(25 MG) BY MOUTH TWICE DAILY WITH A MEAL (Patient taking differently: Take 25 mg by mouth 2 (  two) times daily with a meal. ), Disp: 180 tablet, Rfl: 1 .  co-enzyme Q-10 30 MG capsule, Take 30 mg by mouth daily. , Disp: , Rfl:  .  ELIQUIS 5 MG TABS tablet, Take 1 tablet by mouth 2 (two) times daily., Disp: , Rfl:  .  feeding supplement, ENSURE ENLIVE, (ENSURE ENLIVE) LIQD, Take 237 mLs by mouth 2 (two) times daily between meals., Disp: 14220 mL, Rfl: 0 .  ferrous gluconate (FERGON) 324 MG tablet, Take 324 mg by mouth daily with breakfast., Disp: , Rfl:  .  folic acid (FOLVITE) 400 MCG tablet, Take 400 mcg by mouth daily. , Disp: , Rfl:  .  furosemide (LASIX) 20 MG tablet, TAKE 1 TABLET(20 MG) BY MOUTH DAILY, Disp: 30 tablet, Rfl: 0 .  irbesartan (AVAPRO) 300 MG tablet, TAKE 1 TABLET BY MOUTH EVERY DAY FOR BLOOD PRESSURE (Patient taking differently: Take 300 mg by mouth daily. ), Disp: 90 tablet, Rfl: 1 .  omeprazole (PRILOSEC) 40 MG capsule, Take 1 capsule (40 mg total) by mouth daily., Disp: 90 capsule, Rfl: 1 .  potassium chloride SA (K-DUR) 20 MEQ tablet, Take 1 tablet (20 mEq total) by mouth daily., Disp: 30 tablet, Rfl: 0 .  rosuvastatin (CRESTOR) 10 MG tablet, Take 1 tablet (10 mg total) by mouth daily., Disp: 90 tablet, Rfl: 1 .   solifenacin (VESICARE) 5 MG tablet, Take 1 tablet (5 mg total) by mouth daily., Disp: 30 tablet, Rfl: 2 .  spironolactone (ALDACTONE) 25 MG tablet, Take 1 tablet by mouth daily., Disp: , Rfl:  .  Vitamin D, Cholecalciferol, 1000 UNITS TABS, Take 1,000 Units by mouth daily. , Disp: , Rfl:  .  zolpidem (AMBIEN) 5 MG tablet, Take 1 tablet (5 mg total) by mouth at bedtime as needed for sleep., Disp: 90 tablet, Rfl: 0 .  loratadine (CLARITIN) 10 MG tablet, Take 1 tablet (10 mg total) by mouth daily. (Patient not taking: Reported on 02/04/2019), Disp: 30 tablet, Rfl: 11  Current Facility-Administered Medications:  .  cyanocobalamin ((VITAMIN B-12)) injection 1,000 mcg, 1,000 mcg, Intramuscular, Q30 days, Alba CorySowles, Grier Czerwinski, MD, 1,000 mcg at 04/23/18 1020  Allergies  Allergen Reactions  . Nickel Dermatitis and Swelling  . Other Anaphylaxis    Uncoded Allergy. Allergen: SHELLFISH  . Ace Inhibitors Cough    I personally reviewed active problem list, medication list, allergies, family history, social history, health maintenance with the patient/caregiver today.   ROS  Constitutional: Negative for fever or weight change.  Respiratory: Negative for cough and shortness of breath with activiy .   Cardiovascular: positive for intermitent chest pain ( like an epigastric discomfort -but states it causes SOB, discussed checking bp and heart rate when it happens  Gastrointestinal: Negative for abdominal pain, no bowel changes.  Musculoskeletal: Negative for gait problem or joint swelling.  Skin: Negative for rash.  Neurological: Negative for dizziness or headache.  No other specific complaints in a complete review of systems (except as listed in HPI above).  Objective  Vitals:   02/04/19 1036  BP: 108/72  Pulse: 72  Resp: 16  Temp: (!) 96.8 F (36 C)  TempSrc: Temporal  SpO2: 96%  Weight: 154 lb 9.6 oz (70.1 kg)  Height: 5\' 5"  (1.651 m)    Body mass index is 25.73 kg/m.  Physical  Exam  Constitutional: Patient appears well-developed and well-nourished.  No distress.  HEENT: head atraumatic, normocephalic, pupils equal and reactive to light Cardiovascular: Normal rate, irregular rhythm and normal heart sounds.  Opening click heard  Trace BLE ankle  edema. Pulmonary/Chest: Effort normal and breath sounds normal. No respiratory distress.  Abdominal: Soft.  There is no tenderness. Psychiatric: Patient has a normal mood and affect. behavior is normal. Judgment and thought content normal.  Recent Results (from the past 2160 hour(s))  Blood gas, venous     Status: Abnormal   Collection Time: 11/30/18  7:23 AM  Result Value Ref Range   FIO2 1.00    Delivery systems BILEVEL POSITIVE AIRWAY PRESSURE    pH, Ven 7.36 7.250 - 7.430   pCO2, Ven 48 44.0 - 60.0 mmHg   pO2, Ven 53.0 (H) 32.0 - 45.0 mmHg   Bicarbonate 27.1 20.0 - 28.0 mmol/L   Acid-Base Excess 1.0 0.0 - 2.0 mmol/L   O2 Saturation 85.6 %   Patient temperature 37.0    Collection site VEIN    Sample type VENOUS     Comment: Performed at [facility], [address] [place], [place] [address]  Brain natriuretic peptide     Status: Abnormal   Collection Time: 11/30/18  7:27 AM  Result Value Ref Range   B Natriuretic Peptide 411.0 (H) 0.0 - 100.0 pg/mL    Comment: Performed at [facility], [address] [place], [place] [address]  Lactic acid, plasma     Status: None   Collection Time: 11/30/18  7:27 AM  Result Value Ref Range   Lactic Acid, Venous 1.5 0.5 - 1.9 mmol/L    Comment: Performed at [facility], [address] [place], [place] [address]  Urinalysis, Complete w Microscopic     Status: Abnormal   Collection Time: 11/30/18  7:27 AM  Result Value Ref Range   Color, Urine YELLOW (A) YELLOW   APPearance HAZY (A) CLEAR   Specific Gravity, Urine 1.012 1.005 - 1.030   pH 6.0 5.0 - 8.0   Glucose, UA NEGATIVE NEGATIVE mg/dL   Hgb urine dipstick NEGATIVE NEGATIVE    Bilirubin Urine NEGATIVE NEGATIVE   Ketones, ur NEGATIVE NEGATIVE mg/dL   Protein, ur NEGATIVE NEGATIVE mg/dL   Nitrite NEGATIVE NEGATIVE   Leukocytes,Ua NEGATIVE NEGATIVE   RBC / HPF 0-5 0 - 5 RBC/hpf   WBC, UA 0-5 0 - 5 WBC/hpf   Bacteria, UA NONE SEEN NONE SEEN   Squamous Epithelial / LPF 0-5 0 - 5   Mucus PRESENT    Hyaline Casts, UA PRESENT     Comment: Performed at [facility], [address] [place], [place] [address]  Urine culture     Status: None   Collection Time: 11/30/18  7:27 AM   Specimen: Urine, Random  Result Value Ref Range   Specimen Description      URINE, RANDOM Performed at [facility], [address] [place], [place] [address]    Special Requests      NONE Performed at [facility], [address] [place], [place] [address]    Culture      NO GROWTH Performed at [facility], [address], [place], [place] [address]    Report Status 12/01/2018 FINAL   SARS Coronavirus 2 [facility] order, Performed in [facility] hospital lab) Nasopharyngeal Nasopharyngeal Swab     Status: None   Collection Time: 11/30/18  7:27 AM   Specimen: Nasopharyngeal Swab  Result Value Ref Range   SARS Coronavirus 2 NEGATIVE NEGATIVE    Comment: (NOTE) If result is NEGATIVE SARS-CoV-2 target nucleic acids are NOT  DETECTED. The SARS-CoV-2 RNA is generally detectable in upper and lower  respiratory specimens during the acute phase of infection. The lowest  concentration of SARS-CoV-2 viral copies this assay can detect is 250  copies / mL. A negative result does not preclude SARS-CoV-2 infection  and should not be used as the sole basis for treatment or other  patient management decisions.  A negative result may occur with  improper specimen collection / handling, submission of specimen other  than nasopharyngeal swab, presence of viral mutation(s) within the  areas targeted by this assay, and inadequate number of viral copies  (<250  copies / mL). A negative result must be combined with clinical  observations, patient history, and epidemiological information. If result is POSITIVE SARS-CoV-2 target nucleic acids are DETECTED. The SARS-CoV-2 RNA is generally detectable in upper and lower  respiratory specimens dur ing the acute phase of infection.  Positive  results are indicative of active infection with SARS-CoV-2.  Clinical  correlation with patient history and other diagnostic information is  necessary to determine patient infection status.  Positive results do  not rule out bacterial infection or co-infection with other viruses. If result is PRESUMPTIVE POSTIVE SARS-CoV-2 nucleic acids MAY BE PRESENT.   A presumptive positive result was obtained on the submitted specimen  and confirmed on repeat testing.  While 2019 novel coronavirus  (SARS-CoV-2) nucleic acids may be present in the submitted sample  additional confirmatory testing may be necessary for epidemiological  and / or clinical management purposes  to differentiate between  SARS-CoV-2 and other Sarbecovirus currently known to infect humans.  If clinically indicated additional testing with an alternate test  methodology [phone]) is advised. The SARS-CoV-2 RNA is generally  detectable in upper and lower respiratory sp ecimens during the acute  phase of infection. The expected result is Negative. Fact Sheet for Patients:  StrictlyIdeas.no Fact Sheet for Healthcare Providers: BankingDealers.co.za This test is not yet approved or cleared by the [place] FDA and has been authorized for detection and/or diagnosis of SARS-CoV-2 by FDA under an Emergency Use Authorization (EUA).  This EUA will remain in effect (meaning this test can be used) for the duration of the COVID-19 declaration under Section 564(b)(1) of the Act, 21 U.S.C. section 360bbb-3(b)(1), unless the authorization is terminated or revoked  sooner. Performed at [facility], [address] [place], [place] [address]   CBC with Differential     Status: Abnormal   Collection Time: 11/30/18  7:28 AM  Result Value Ref Range   WBC 8.6 4.0 - 10.5 K/uL   RBC 3.92 3.87 - 5.11 MIL/uL   Hemoglobin 11.8 (L) 12.0 - 15.0 g/dL   HCT 38.1 36.0 - 46.0 %   MCV 97.2 80.0 - 100.0 fL   MCH 30.1 26.0 - 34.0 pg   MCHC 31.0 30.0 - 36.0 g/dL   RDW 14.2 11.5 - 15.5 %   Platelets 125 (L) 150 - 400 K/uL   nRBC 0.0 0.0 - 0.2 %   Neutrophils Relative % 72 %   Neutro Abs 6.3 1.7 - 7.7 K/uL   Lymphocytes Relative 20 %   Lymphs Abs 1.7 0.7 - 4.0 K/uL   Monocytes Relative 6 %   Monocytes Absolute 0.5 0.1 - 1.0 K/uL   Eosinophils Relative 1 %   Eosinophils Absolute 0.1 0.0 - 0.5 K/uL   Basophils Relative 0 %   Basophils Absolute 0.0 0.0 - 0.1 K/uL   Immature Granulocytes 1 %   Abs  Immature Granulocytes 0.05 0.00 - 0.07 K/uL    Comment: Performed at [facility], [address] [place], [place] [address]  Troponin I (High Sensitivity)     Status: Abnormal   Collection Time: 11/30/18  7:28 AM  Result Value Ref Range   Troponin I (High Sensitivity) 32 (H) <18 ng/L    Comment: (NOTE) Elevated high sensitivity troponin I (hsTnI) values and significant  changes across serial measurements may suggest ACS but many other  chronic and acute conditions are known to elevate hsTnI results.  Refer to the "Links" section for chest pain algorithms and additional  guidance. Performed at [facility], [address] [place], [place] [address]   Culture, blood (routine x 2)     Status: Abnormal   Collection Time: 11/30/18  7:28 AM   Specimen: BLOOD  Result Value Ref Range   Specimen Description      BLOOD LEFT [facility] Performed at [facility], [address], [place], [place] [address]    Special Requests      BOTTLES DRAWN AEROBIC AND ANAEROBIC Blood Culture adequate volume Performed at [facility],  [address] [place], [place] [address]    Culture  Setup Time      GRAM POSITIVE COCCI AEROBIC BOTTLE ONLY CRITICAL RESULT CALLED TO, READ BACK BY AND VERIFIED WITH: SCOTT HALL @320  12/01/2018 TTG    Culture (A)     STAPHYLOCOCCUS SPECIES (COAGULASE NEGATIVE) THE SIGNIFICANCE OF ISOLATING THIS ORGANISM FROM A SINGLE SET OF BLOOD CULTURES WHEN MULTIPLE SETS ARE DRAWN IS UNCERTAIN. PLEASE NOTIFY THE MICROBIOLOGY DEPARTMENT WITHIN ONE WEEK IF SPECIATION AND SENSITIVITIES ARE REQUIRED. Performed at [facility], [address] [place], [place]    Report Status 12/02/2018 FINAL   Culture, blood (routine x 2)     Status: None   Collection Time: 11/30/18  7:28 AM   Specimen: BLOOD  Result Value Ref Range   Specimen Description BLOOD RIGHT HAND    Special Requests      BOTTLES DRAWN AEROBIC AND ANAEROBIC Blood Culture results may not be optimal due to an excessive volume of blood received in culture bottles   Culture      NO GROWTH 5 DAYS Performed at [facility], [address] [place], [place]    Report Status 12/05/2018 FINAL   Lipase, blood     Status: None   Collection Time: 11/30/18  7:28 AM  Result Value Ref Range   Lipase 30 11 - 51 U/L    Comment: Performed at [facility], [address] [place], [place]  Comprehensive metabolic panel     Status: Abnormal   Collection Time: 11/30/18  7:28 AM  Result Value Ref Range   Sodium 136 135 - 145 mmol/L   Potassium 3.4 (L) 3.5 - 5.1 mmol/L   Chloride 106 98 - 111 mmol/L   CO2 23 22 - 32 mmol/L   Glucose, Bld 188 (H) 70 - 99 mg/dL   BUN 17 8 - 23 mg/dL   Creatinine, Ser 12/02/18 0.44 - 1.00 mg/dL   Calcium 8.9 8.9 - 5.97 mg/dL   Total Protein 7.4 6.5 - 8.1 g/dL   Albumin 3.7 3.5 - 5.0 g/dL   AST 25 15 - 41 U/L   ALT 17 0 - 44 U/L   Alkaline Phosphatase 82 38 - 126 U/L   Total Bilirubin 0.7 0.3 - 1.2 mg/dL   GFR calc non  Af Amer 53 (L) >60 mL/min   GFR calc Af Amer  >60 >60 mL/min   Anion gap 7 5 - 15    Comment: Performed at [facility], [address] [place], [place] [address]  Blood Culture ID Panel (Reflexed)     Status: Abnormal   Collection Time: 11/30/18  7:28 AM  Result Value Ref Range   Enterococcus species NOT DETECTED NOT DETECTED   Listeria monocytogenes NOT DETECTED NOT DETECTED   Staphylococcus species DETECTED (A) NOT DETECTED    Comment: Methicillin (oxacillin) susceptible coagulase negative staphylococcus. Possible blood culture contaminant (unless isolated from more than one blood culture draw or clinical case suggests pathogenicity). No antibiotic treatment is indicated for blood  culture contaminants. CRITICAL RESULT CALLED TO, READ BACK BY AND VERIFIED WITH: SCOTT HALL  12/01/2018 TTG    Staphylococcus aureus (BCID) NOT DETECTED NOT DETECTED   Methicillin resistance NOT DETECTED NOT DETECTED   Streptococcus species NOT DETECTED NOT DETECTED   Streptococcus agalactiae NOT DETECTED NOT DETECTED   Streptococcus pneumoniae NOT DETECTED NOT DETECTED   Streptococcus pyogenes NOT DETECTED NOT DETECTED   Acinetobacter baumannii NOT DETECTED NOT DETECTED   Enterobacteriaceae species NOT DETECTED NOT DETECTED   Enterobacter cloacae complex NOT DETECTED NOT DETECTED   Escherichia coli NOT DETECTED NOT DETECTED   Klebsiella oxytoca NOT DETECTED NOT DETECTED   Klebsiella pneumoniae NOT DETECTED NOT DETECTED   Proteus species NOT DETECTED NOT DETECTED   Serratia marcescens NOT DETECTED NOT DETECTED   Haemophilus influenzae NOT DETECTED NOT DETECTED   Neisseria meningitidis NOT DETECTED NOT DETECTED   Pseudomonas aeruginosa NOT DETECTED NOT DETECTED   Candida albicans NOT DETECTED NOT DETECTED   Candida glabrata NOT DETECTED NOT DETECTED   Candida krusei NOT DETECTED NOT DETECTED   Candida parapsilosis NOT DETECTED NOT DETECTED   Candida tropicalis NOT DETECTED NOT DETECTED    Comment: Performed at [facility],  [address] [place], [place] [address]  Lactic acid, plasma     Status: None   Collection Time: 11/30/18  9:53 AM  Result Value Ref Range   Lactic Acid, Venous 1.2 0.5 - 1.9 mmol/L    Comment: Performed at [facility], [address] [place], [place] [address]  Troponin I (High Sensitivity)     Status: Abnormal   Collection Time: 11/30/18  9:53 AM  Result Value Ref Range   Troponin I (High Sensitivity) 43 (H) <18 ng/L    Comment: (NOTE) Elevated high sensitivity troponin I (hsTnI) values and significant  changes across serial measurements may suggest ACS but many other  chronic and acute conditions are known to elevate hsTnI results.  Refer to the "Links" section for chest pain algorithms and additional  guidance. Performed at [facility], [address] [place], [place] [address]   ECHOCARDIOGRAM COMPLETE     Status: None   Collection Time: 11/30/18  8:23 PM  Result Value Ref Range   Weight 2,499.2 oz   Height 63 in   BP 142/57 mmHg  Basic metabolic panel     Status: Abnormal   Collection Time: 12/01/18  5:20 AM  Result Value Ref Range   Sodium 138 135 - 145 mmol/L   Potassium 2.8 (L) 3.5 - 5.1 mmol/L   Chloride 103 98 - 111 mmol/L   CO2 27 22 - 32 mmol/L   Glucose, Bld 115 (H) 70 - 99 mg/dL   BUN 16 8 - 23 mg/dL   Creatinine, Ser 6.57  0.44 - 1.00 mg/dL   Calcium 9.0 8.9 - 56.4 mg/dL   GFR calc non Af Amer >60 >60 mL/min   GFR calc Af Amer >60 >60 mL/min   Anion gap 8 5 - 15    Comment: Performed at [facility], [address] [place], [place] [address]  Magnesium     Status: None   Collection Time: 12/01/18  5:20 AM  Result Value Ref Range   Magnesium 1.9 1.7 - 2.4 mg/dL    Comment: Performed at [facility], [address] [place], [place] [address]  Basic metabolic panel     Status: Abnormal   Collection Time: 12/02/18  5:28 AM  Result Value Ref Range   Sodium 134 (L) 135 - 145 mmol/L   Potassium 4.4 3.5 - 5.1  mmol/L   Chloride 102 98 - 111 mmol/L   CO2 25 22 - 32 mmol/L   Glucose, Bld 115 (H) 70 - 99 mg/dL   BUN 19 8 - 23 mg/dL   Creatinine, Ser 6.60 0.44 - 1.00 mg/dL   Calcium 9.1 8.9 - 63.0 mg/dL   GFR calc non Af Amer 53 (L) >60 mL/min   GFR calc Af Amer >60 >60 mL/min   Anion gap 7 5 - 15    Comment: Performed at [facility], [address] [place], [place] [address]  Magnesium     Status: Abnormal   Collection Time: 12/02/18  5:28 AM  Result Value Ref Range   Magnesium 2.5 (H) 1.7 - 2.4 mg/dL    Comment: Performed at [facility], [address] [place], [place] [address]  CBC with Differential/Platelet     Status: Abnormal   Collection Time: 12/10/18 11:27 AM  Result Value Ref Range   WBC 6.7 4.0 - 10.5 K/uL   RBC 3.40 (L) 3.87 - 5.11 MIL/uL   Hemoglobin 10.3 (L) 12.0 - 15.0 g/dL   HCT 57.3 (L) 22.0 - 25.4 %   MCV 95.3 80.0 - 100.0 fL   MCH 30.3 26.0 - 34.0 pg   MCHC 31.8 30.0 - 36.0 g/dL   RDW 27.0 62.3 - 76.2 %   Platelets 154 150 - 400 K/uL   nRBC 0.0 0.0 - 0.2 %   Neutrophils Relative % 72 %   Neutro Abs 4.8 1.7 - 7.7 K/uL   Lymphocytes Relative 20 %   Lymphs Abs 1.4 0.7 - 4.0 K/uL   Monocytes Relative 7 %   Monocytes Absolute 0.5 0.1 - 1.0 K/uL   Eosinophils Relative 1 %   Eosinophils Absolute 0.1 0.0 - 0.5 K/uL   Basophils Relative 0 %   Basophils Absolute 0.0 0.0 - 0.1 K/uL   Immature Granulocytes 0 %   Abs Immature Granulocytes 0.02 0.00 - 0.07 K/uL    Comment: Performed at [facility], [address] [place], [place] [address]  COMPLETE METABOLIC PANEL WITH GFR     Status: Abnormal   Collection Time: 01/25/19 12:00 AM  Result Value Ref Range   Glucose, Bld 99 65 - 99 mg/dL    Comment: .            Fasting reference interval .    BUN 25 7 - 25 mg/dL   Creat 7.61 (H) 6.07 - 0.88 mg/dL    Comment: For patients >77 years of age, the reference limit for Creatinine is approximately 13% higher for people identified as  African-American. .    GFR, Est Non African American  41 (L) > OR = 60 mL/min/1.31m2   GFR, Est African American 48 (L) > OR = 60 mL/min/1.18m2   BUN/Creatinine Ratio 21 6 - 22 (calc)   Sodium 138 135 - 146 mmol/L   Potassium 4.7 3.5 - 5.3 mmol/L   Chloride 106 98 - 110 mmol/L   CO2 25 20 - 32 mmol/L   Calcium 9.2 8.6 - 10.4 mg/dL   Total Protein 7.0 6.1 - 8.1 g/dL   Albumin 3.9 3.6 - 5.1 g/dL   Globulin 3.1 1.9 - 3.7 g/dL (calc)   AG Ratio 1.3 1.0 - 2.5 (calc)   Total Bilirubin 0.4 0.2 - 1.2 mg/dL   Alkaline phosphatase (APISO) 75 37 - 153 U/L   AST 22 10 - 35 U/L   ALT 20 6 - 29 U/L  Magnesium     Status: None   Collection Time: 01/25/19 12:00 AM  Result Value Ref Range   Magnesium 2.3 1.5 - 2.5 mg/dL  CBC with Differential/Platelet     Status: Abnormal   Collection Time: 01/25/19 12:00 AM  Result Value Ref Range   WBC 5.3 3.8 - 10.8 Thousand/uL   RBC 3.59 (L) 3.80 - 5.10 Million/uL   Hemoglobin 11.0 (L) 11.7 - 15.5 g/dL   HCT 16.1 (L) 09.6 - 04.5 %   MCV 95.0 80.0 - 100.0 fL   MCH 30.6 27.0 - 33.0 pg   MCHC 32.3 32.0 - 36.0 g/dL   RDW 40.9 81.1 - 91.4 %   Platelets 143 140 - 400 Thousand/uL   MPV 11.3 7.5 - 12.5 fL   Neutro Abs 3,419 1,500 - 7,800 cells/uL   Lymphs Abs 1,330 850 - 3,900 cells/uL   Absolute Monocytes 451 200 - 950 cells/uL   Eosinophils Absolute 69 15 - 500 cells/uL   Basophils Absolute 32 0 - 200 cells/uL   Neutrophils Relative % 64.5 %   Total Lymphocyte 25.1 %   Monocytes Relative 8.5 %   Eosinophils Relative 1.3 %   Basophils Relative 0.6 %     PHQ2/9: Depression screen [facility] 2/9 02/04/2019 01/25/2019 12/08/2018 11/05/2018 08/03/2018  Decreased Interest 0 0 0 0 0  Down, Depressed, Hopeless 0 0 0 0 0  PHQ - 2 Score 0 0 0 0 0  Altered sleeping 0 - 0 2 0  Tired, decreased energy 0 - 0 1 0  Change in appetite 0 - 0 2 0  Feeling bad or failure about yourself  0 - 0 0 0  Trouble concentrating 0 - 0 0 0  Moving slowly or fidgety/restless 0 - 0 0 0   Suicidal thoughts 0 - 0 0 0  PHQ-9 Score 0 - 0 5 0  Difficult doing work/chores Not difficult at all - Not difficult at all Somewhat difficult -  Some recent data might be hidden    phq 9 is negative   Fall Risk: Fall Risk  02/04/2019 01/25/2019 12/08/2018 11/05/2018 04/23/2018  Falls in the past year? 0 0 0 0 0  Number falls in past yr: 0 0 0 0 0  Injury with Fall? 0 0 0 0 0  Risk for fall due to : - - - - -  Risk for fall due to: Comment - - - - -  Follow up - Falls prevention discussed Falls evaluation completed - -     Functional Status Survey: Is the patient deaf or have difficulty hearing?: Yes Does the patient have difficulty seeing, even when wearing glasses/contacts?: Yes Does  the patient have difficulty concentrating, remembering, or making decisions?: No Does the patient have difficulty walking or climbing stairs?: No Does the patient have difficulty dressing or bathing?: No Does the patient have difficulty doing errands alone such as visiting a doctor's office or shopping?: No    Assessment & Plan  1. Paroxysmal A-fib (HCC)  Rate controlled on Eliquis   2. Benign essential HTN  Towards low of normal   3. Dyslipidemia  On statin therapy   4. Mesenteric vascular insufficiency (HCC)  Continue statin therapy and Eliquis   5. GERD without esophagitis  Advised Tums   6. Hypertrophic cardiomyopathy (HCC)  Reviewed recent Echo  7. Senile purpura (HCC)  stable  8. CHF NYHA class I, chronic, diastolic (HCC)  Sees Dr. Regino Schultze  9. Insomnia, persistent  - zolpidem (AMBIEN) 5 MG tablet; Take 1 tablet (5 mg total) by mouth at bedtime as needed for sleep.  Dispense: 90 tablet; Refill: 0

## 2019-02-11 ENCOUNTER — Other Ambulatory Visit: Payer: Self-pay | Admitting: Family Medicine

## 2019-02-11 DIAGNOSIS — I480 Paroxysmal atrial fibrillation: Secondary | ICD-10-CM

## 2019-02-11 DIAGNOSIS — E785 Hyperlipidemia, unspecified: Secondary | ICD-10-CM

## 2019-02-11 NOTE — Telephone Encounter (Signed)
Wrong office

## 2019-02-11 NOTE — Telephone Encounter (Signed)
Requested medication (s) are due for refill today: no  Requested medication (s) are on the active medication list: no  Last refill:  11/16/2018  Future visit scheduled: yes  Notes to clinic: medication has been discontinued    Requested Prescriptions  Pending Prescriptions Disp Refills   PRADAXA 150 MG CAPS capsule [Pharmacy Med Name: PRADAXA 150MG  CAPS (BLISTER PACK)] 180 capsule 1    Sig: TAKE 1 CAPSULE(150 MG) BY MOUTH TWICE DAILY     Hematology:  Anticoagulants Failed - 02/11/2019  8:35 AM      Failed - HGB in normal range and within 360 days    Hemoglobin  Date Value Ref Range Status  01/25/2019 11.0 (L) 11.7 - 15.5 g/dL Final  08/17/2015 11.4 11.1 - 15.9 g/dL Final         Failed - HCT in normal range and within 360 days    HCT  Date Value Ref Range Status  01/25/2019 34.1 (L) 35.0 - 45.0 % Final   Hematocrit  Date Value Ref Range Status  08/17/2015 34.9 34.0 - 46.6 % Final         Failed - Cr in normal range and within 360 days    Creat  Date Value Ref Range Status  01/25/2019 1.21 (H) 0.60 - 0.88 mg/dL Final    Comment:    For patients >75 years of age, the reference limit for Creatinine is approximately 13% higher for people identified as African-American. .          Passed - PLT in normal range and within 360 days    Platelets  Date Value Ref Range Status  01/25/2019 143 140 - 400 Thousand/uL Final  08/17/2015 177 150 - 379 x10E3/uL Final         Passed - Valid encounter within last 12 months    Recent Outpatient Visits          1 week ago Paroxysmal A-fib [facility])   [facility], Drue Stager, MD   2 months ago Acute respiratory failure with hypoxia [facility])   [place], FNP   3 months ago Thrombocytopenia [facility])   [facility] Steele Sizer, MD   6 months ago Benign essential HTN   [facility], Drue Stager, MD   9 months ago Paroxysmal A-fib [facility])    [facility] Steele Sizer, MD      Future Appointments            In 2 months Ancil Boozer, Drue Stager, MD [facility], [place]   In 11 months  [facility], [facility]           Signed Prescriptions Disp Refills   rosuvastatin (CRESTOR) 10 MG tablet 90 tablet 1    Sig: TAKE 1 TABLET(10 MG) BY MOUTH DAILY     Cardiovascular:  Antilipid - Statins Passed - 02/11/2019  8:35 AM      Passed - Total Cholesterol in normal range and within 360 days    Cholesterol, Total  Date Value Ref Range Status  08/17/2015 192 100 - 199 mg/dL Final   Cholesterol  Date Value Ref Range Status  04/23/2018 168 <200 mg/dL Final         Passed - LDL in normal range and within 360 days    LDL Cholesterol (Calc)  Date Value Ref Range Status  04/23/2018 78 mg/dL (calc) Final    Comment:    Reference range: <  100 . Desirable range <100 mg/dL for primary prevention;   <70 mg/dL for patients with CHD or diabetic patients  with > or = 2 CHD risk factors. Marland Kitchen LDL-C is now calculated using the Martin-Hopkins  calculation, which is a validated novel method providing  better accuracy than the Friedewald equation in the  estimation of LDL-C.  Horald Pollen et al. Lenox Ahr. 0623;762(83): 2061-2068  (http://education.[employer].com/faq/FAQ164)          Passed - HDL in normal range and within 360 days    HDL  Date Value Ref Range Status  04/23/2018 74 >50 mg/dL Final  15/17/6160 96 >73 mg/dL Final         Passed - Triglycerides in normal range and within 360 days    Triglycerides  Date Value Ref Range Status  04/23/2018 75 <150 mg/dL Final         Passed - Patient is not pregnant      Passed - Valid encounter within last 12 months    Recent Outpatient Visits          1 week ago Paroxysmal A-fib [facility])   [facility] Alba Cory, MD   2 months ago Acute respiratory failure with hypoxia [facility])   [facility]  Doren Custard, FNP   3 months ago Thrombocytopenia [facility])   [facility] Alba Cory, MD   6 months ago Benign essential HTN   [facility] Alba Cory, MD   9 months ago Paroxysmal A-fib [facility])   [facility] Alba Cory, MD      Future Appointments            In 2 months Carlynn Purl, Danna Hefty, MD [facility], [facility]   In 11 months  [facility], [facility]

## 2019-02-21 DIAGNOSIS — I422 Other hypertrophic cardiomyopathy: Secondary | ICD-10-CM | POA: Diagnosis not present

## 2019-02-21 DIAGNOSIS — I480 Paroxysmal atrial fibrillation: Secondary | ICD-10-CM | POA: Diagnosis not present

## 2019-03-01 DIAGNOSIS — H2513 Age-related nuclear cataract, bilateral: Secondary | ICD-10-CM | POA: Diagnosis not present

## 2019-03-11 ENCOUNTER — Other Ambulatory Visit: Payer: Self-pay | Admitting: Family Medicine

## 2019-03-11 DIAGNOSIS — E2839 Other primary ovarian failure: Secondary | ICD-10-CM

## 2019-03-14 ENCOUNTER — Other Ambulatory Visit: Payer: Self-pay | Admitting: Family Medicine

## 2019-03-14 DIAGNOSIS — I10 Essential (primary) hypertension: Secondary | ICD-10-CM

## 2019-04-18 DIAGNOSIS — I3139 Other pericardial effusion (noninflammatory): Secondary | ICD-10-CM | POA: Insufficient documentation

## 2019-04-18 DIAGNOSIS — I313 Pericardial effusion (noninflammatory): Secondary | ICD-10-CM | POA: Insufficient documentation

## 2019-05-06 ENCOUNTER — Encounter: Payer: Self-pay | Admitting: Family Medicine

## 2019-05-06 ENCOUNTER — Ambulatory Visit
Admission: RE | Admit: 2019-05-06 | Discharge: 2019-05-06 | Disposition: A | Discharge status: Home / Self Care | Payer: Medicare Other | Source: Ambulatory Visit | Attending: Family Medicine | Admitting: Family Medicine

## 2019-05-06 ENCOUNTER — Other Ambulatory Visit: Payer: Self-pay

## 2019-05-06 ENCOUNTER — Ambulatory Visit (INDEPENDENT_AMBULATORY_CARE_PROVIDER_SITE_OTHER): Payer: Medicare Other | Admitting: Family Medicine

## 2019-05-06 ENCOUNTER — Ambulatory Visit
Admission: RE | Admit: 2019-05-06 | Discharge: 2019-05-06 | Disposition: A | Discharge status: Home / Self Care | Payer: Medicare Other | Attending: Family Medicine | Admitting: Family Medicine

## 2019-05-06 VITALS — BP 122/60 | HR 62 | Temp 96.6°F | Resp 16 | Ht 65.0 in | Wt 149.0 lb

## 2019-05-06 DIAGNOSIS — R42 Dizziness and giddiness: Secondary | ICD-10-CM | POA: Diagnosis not present

## 2019-05-06 DIAGNOSIS — I10 Essential (primary) hypertension: Secondary | ICD-10-CM | POA: Diagnosis not present

## 2019-05-06 DIAGNOSIS — M545 Low back pain, unspecified: Secondary | ICD-10-CM

## 2019-05-06 DIAGNOSIS — E538 Deficiency of other specified B group vitamins: Secondary | ICD-10-CM | POA: Diagnosis not present

## 2019-05-06 DIAGNOSIS — I422 Other hypertrophic cardiomyopathy: Secondary | ICD-10-CM

## 2019-05-06 DIAGNOSIS — G4700 Insomnia, unspecified: Secondary | ICD-10-CM

## 2019-05-06 DIAGNOSIS — K551 Chronic vascular disorders of intestine: Secondary | ICD-10-CM | POA: Diagnosis not present

## 2019-05-06 DIAGNOSIS — D692 Other nonthrombocytopenic purpura: Secondary | ICD-10-CM | POA: Diagnosis not present

## 2019-05-06 DIAGNOSIS — F325 Major depressive disorder, single episode, in full remission: Secondary | ICD-10-CM | POA: Diagnosis not present

## 2019-05-06 DIAGNOSIS — J3089 Other allergic rhinitis: Secondary | ICD-10-CM

## 2019-05-06 DIAGNOSIS — I480 Paroxysmal atrial fibrillation: Secondary | ICD-10-CM

## 2019-05-06 DIAGNOSIS — R6889 Other general symptoms and signs: Secondary | ICD-10-CM | POA: Diagnosis not present

## 2019-05-06 DIAGNOSIS — Z9181 History of falling: Secondary | ICD-10-CM

## 2019-05-06 DIAGNOSIS — E785 Hyperlipidemia, unspecified: Secondary | ICD-10-CM | POA: Diagnosis not present

## 2019-05-06 DIAGNOSIS — I5032 Chronic diastolic (congestive) heart failure: Secondary | ICD-10-CM | POA: Diagnosis not present

## 2019-05-06 DIAGNOSIS — E559 Vitamin D deficiency, unspecified: Secondary | ICD-10-CM

## 2019-05-06 MED ORDER — ZOLPIDEM TARTRATE 5 MG PO TABS: 5.0000 mg | ORAL_TABLET | Freq: Every evening | ORAL | 0 refills | Status: DC | PRN

## 2019-05-06 MED ORDER — LEVOCETIRIZINE DIHYDROCHLORIDE 5 MG PO TABS: 5.0000 mg | ORAL_TABLET | Freq: Every evening | ORAL | 0 refills | Status: DC

## 2019-05-06 MED ORDER — AMLODIPINE BESYLATE 10 MG PO TABS: 10.0000 mg | ORAL_TABLET | Freq: Every day | ORAL | 1 refills | Status: DC

## 2019-05-06 NOTE — Progress Notes (Signed)
Name: Sharon Bullock   MRN: [id number]    DOB: 11-03-1933   Date:05/06/2019       Progress Note  Subjective  Chief Complaint  Chief Complaint  Patient presents with  . Hypertension  . Atrial Fibrillation  . Allergic Rhinitis   . Nasal Congestion    Allergies are worse causing more nasal congestion constantly running.  . Dizziness    She has increased episodes of vertigo since she fell.  . Extremity Weakness    She has weakness that comes and goes.  . Chills    She started having chills when she started taking the Eliquis and Spironolactone.  . Fall    She has pain in her hip and she is not sure if it is a pinched nerve or is it coming from the fall.    HPI  Recent fall: she states she took the trash out about 3 weeks ago and tripped on the edge of her drive away and fell on the left side. She states did not feel pain right away but a couple of days later she developed left lower back pain . She has a history of left lower back pain and seems to have flare it up. Discussed going back to PT but she is worried because of COVID-19. She states since the fall , the jerking of her body caused the vertigo to return - she is doing home Epley Maneuvers and is doing better now   HTN: bp is at goal today, she denies chest pain or palpitation, she has some sob with activity  Carotid atherosclerosis: found during MRI head done [facility] for evaluation of vertigo in 12/2015. On medical management at this time. She also has mesenteric insufficiency, she denies abdominal pain or blood in stools.Taking Crestor and Eliquis  no side effects of medications  Afib/CHF : she sees a cardiologist - [facility]ult[facility] at [facility]. She has been on Pradaxa, shehas occasional fluttering. Last admission for CHF was August 2020.  She had an echo that showed ventricular hypertrophy and normal EF, diastolic CHF  Insomnia: She tried Trazodone without help, only able to sleep one hour per night,she is on Ambien 10 mg, tried  Ambien 5 mg without much help,but insurance stopped paying for it, so she is back on 5 mg. Diuretics causes nocturia   Hypertrophic cardiomyopathy and hypertensive pulmonary vascular disease: she sees Dr. Regino Schultze, no chest pain, SOB orparoxysmal nocturnal dyspnea, the bilateral swellingon legs is stable, better in am and worse at night.She is on ARB, beta-blocker and Eliquis and denies side effects   Dyslipidemia: she is on Crestor and CoQ 10, denies side effects of medication.Unchanged   Thrombocytopenia: seeing hematologist-she went back to Dr. Smith Robert 05/2018 , she is still getting B12 injections because of history of pernicious anemia. She also has normocitic anemia, she denies fatigue. Last platelets was back to normal. Anemia is stable   MDD: severe episode last year that lasted months,she states she is in remission.   Perennial allergic rhinitis: she has post-nasal drainage , usually clear to cloudy in color and sometimes it makes her feel sob.   Cold intolerance: she states her hands are very cold, feels like chills at times, no fever, going on for weeks.   Patient Active Problem List   Diagnosis Date Noted  . Moderate major depression, single episode (HCC) 08/03/2018  . Thrombocytopenia (HCC) 05/27/2016  . CHF NYHA class I, chronic, diastolic (HCC) 05/26/2016  . Mesenteric vascular insufficiency (HCC)  02/28/2016  . Pancreas cyst 02/28/2016  . Increased endometrial stripe thickness 02/28/2016  . Carotid atherosclerosis, bilateral 01/23/2016  . Hearing loss 08/17/2015  . Mitral regurgitation 04/19/2015  . Pernicious anemia 09/27/2014  . Paroxysmal A-fib ([place]) 09/27/2014  . Benign essential HTN 09/27/2014  . Perennial allergic rhinitis 09/27/2014  . Chronic anemia 09/27/2014  . Insomnia, persistent 09/27/2014  . Hypertensive pulmonary vascular disease ([place]) 09/27/2014  . B12 deficiency 09/27/2014  . Diverticulosis of colon 09/27/2014  . Dyslipidemia 09/27/2014  .  Edema extremities 09/27/2014  . Gastric reflux 09/27/2014  . Bilateral hearing loss 09/27/2014  . Polypharmacy 09/27/2014  . Pelvic pain in female 09/27/2014  . MI (mitral incompetence) 09/27/2014  . Internal hemorrhoids 09/27/2014  . Osteopenia 09/27/2014  . Arthralgia of shoulder 09/27/2014  . Finger tendinitis 09/27/2014  . Urge incontinence 09/27/2014  . Cervical prolapse 09/27/2014  . Vertigo 09/27/2014  . Vitamin D deficiency 09/27/2014  . Bladder cystocele 04/15/2012  . Asymmetric septal hypertrophy ([place]) 05/19/2011  . Hypertrophic cardiomyopathy ([place]) 05/19/2011    Past Surgical History:  Procedure Laterality Date  . BLADDER REPAIR  2014   tact    Family History  Problem Relation Age of Onset  . Alzheimer's disease Mother   . Hypertension Mother   . Stroke Father   . Heart disease Father   . Heart disease Brother   . Hypertension Brother   . Cancer Daughter        breast  . Hypertension Daughter   . Breast cancer Daughter   . Diabetes Brother   . Heart disease Brother   . Heart disease Brother   . Diabetes Brother   . Hypertension Brother   . Heart disease Brother   . Hypertension Brother   . Cancer Brother        prostate  . Diabetes Brother   . Heart disease Brother   . Hypertension Brother   . Cancer Brother        prostate     Current Outpatient Medications:  .  amiodarone (PACERONE) 200 MG tablet, Take 1 tablet by mouth daily., Disp: , Rfl:  .  amLODipine (NORVASC) 10 MG tablet, Take 1 tablet (10 mg total) by mouth daily., Disp: 90 tablet, Rfl: 1 .  calcium-vitamin D (OSCAL WITH D) 500-200 MG-UNIT tablet, Take 1 tablet by mouth 2 (two) times daily., Disp: , Rfl:  .  carvedilol (COREG) 25 MG tablet, TAKE 1 TABLET(25 MG) BY MOUTH TWICE DAILY WITH A MEAL (Patient taking differently: Take 25 mg by mouth 2 (two) times daily with a meal. ), Disp: 180 tablet, Rfl: 1 .  co-enzyme Q-10 30 MG capsule, Take 30 mg by mouth daily. , Disp: , Rfl:  .  ELIQUIS  5 MG TABS tablet, Take 1 tablet by mouth 2 (two) times daily., Disp: , Rfl:  .  ferrous gluconate (FERGON) 324 MG tablet, Take 324 mg by mouth daily with breakfast., Disp: , Rfl:  .  folic acid (FOLVITE) 673 MCG tablet, Take 400 mcg by mouth daily. , Disp: , Rfl:  .  furosemide (LASIX) 20 MG tablet, TAKE 1 TABLET(20 MG) BY MOUTH DAILY, Disp: 30 tablet, Rfl: 0 .  irbesartan (AVAPRO) 300 MG tablet, Take 1 tablet (300 mg total) by mouth daily., Disp: 90 tablet, Rfl: 1 .  omeprazole (PRILOSEC) 40 MG capsule, Take 1 capsule (40 mg total) by mouth daily., Disp: 90 capsule, Rfl: 1 .  potassium chloride SA (K-DUR) 20 MEQ tablet, Take 1 tablet (  20 mEq total) by mouth daily., Disp: 30 tablet, Rfl: 0 .  rosuvastatin (CRESTOR) 10 MG tablet, TAKE 1 TABLET(10 MG) BY MOUTH DAILY, Disp: 90 tablet, Rfl: 1 .  solifenacin (VESICARE) 5 MG tablet, Take 1 tablet (5 mg total) by mouth daily., Disp: 30 tablet, Rfl: 2 .  spironolactone (ALDACTONE) 25 MG tablet, Take 1 tablet by mouth daily., Disp: , Rfl:  .  Vitamin D, Cholecalciferol, 1000 UNITS TABS, Take 1,000 Units by mouth daily. , Disp: , Rfl:  .  zolpidem (AMBIEN) 5 MG tablet, Take 1 tablet (5 mg total) by mouth at bedtime as needed for sleep., Disp: 90 tablet, Rfl: 0 .  loratadine (CLARITIN) 10 MG tablet, Take 1 tablet (10 mg total) by mouth daily. (Patient not taking: Reported on 02/04/2019), Disp: 30 tablet, Rfl: 11  Current Facility-Administered Medications:  .  cyanocobalamin ((VITAMIN B-12)) injection 1,000 mcg, 1,000 mcg, Intramuscular, Q30 days, Alba Cory, MD, 1,000 mcg at 04/23/18 1020  Allergies  Allergen Reactions  . Nickel Dermatitis and Swelling  . Other Anaphylaxis    Uncoded Allergy. Allergen: SHELLFISH  . Ace Inhibitors Cough    I personally reviewed active problem list, medication list, allergies, family history, social history with the patient/caregiver today.   ROS  Constitutional: Negative for fever or weight change.   Respiratory: Negative for cough, positive for  shortness of breath.   Cardiovascular: Negative for chest pain or palpitations.  Gastrointestinal: Negative for abdominal pain, no bowel changes.  Musculoskeletal: Negative for gait problem or joint swelling.  Skin: Negative for rash.  Neurological: Positive  for dizziness but no  headache.  No other specific complaints in a complete review of systems (except as listed in HPI above).  Objective  Vitals:   05/06/19 1130  BP: 122/60  Pulse: 62  Resp: 16  Temp: (!) 96.6 F (35.9 C)  TempSrc: Temporal  SpO2: 97%  Weight: 149 lb (67.6 kg)  Height: 5\' 5"  (1.651 m)    Body mass index is 24.79 kg/m.  Physical Exam  Constitutional: Patient appears well-developed and well-nourished. Obese  No distress.  HEENT: head atraumatic, normocephalic, pupils equal and reactive to light Cardiovascular: Normal rate, regular rhythm and normal heart sounds.  2/6 systolic  murmur heard. No BLE edema. Pulmonary/Chest: Effort normal and breath sounds normal. No respiratory distress. Muscular skeletal; pain during palpation of left lower back, negative straight leg raise, using a cane Abdominal: Soft.  There is no tenderness. Psychiatric: Patient has a normal mood and affect. behavior is normal. Judgment and thought content normal.  PHQ2/9: Depression screen [facility] 2/9 05/06/2019 02/04/2019 01/25/2019 12/08/2018 11/05/2018  Decreased Interest 0 0 0 0 0  Down, Depressed, Hopeless 0 0 0 0 0  PHQ - 2 Score 0 0 0 0 0  Altered sleeping 0 0 - 0 2  Tired, decreased energy 0 0 - 0 1  Change in appetite 0 0 - 0 2  Feeling bad or failure about yourself  0 0 - 0 0  Trouble concentrating 0 0 - 0 0  Moving slowly or fidgety/restless 0 0 - 0 0  Suicidal thoughts 0 0 - 0 0  PHQ-9 Score 0 0 - 0 5  Difficult doing work/chores - Not difficult at all - Not difficult at all Somewhat difficult  Some recent data might be hidden    phq 9 is negative   Fall Risk: Fall  Risk  05/06/2019 02/04/2019 01/25/2019 12/08/2018 11/05/2018  Falls in the past year? 1 0  0 0 0  Number falls in past yr: 0 0 0 0 0  Injury with Fall? 1 0 0 0 0  Risk for fall due to : - - - - -  Risk for fall due to: Comment - - - - -  Follow up - - Falls prevention discussed Falls evaluation completed -     Assessment & Plan  1. Dizziness  - Ambulatory referral to ENT  2. History of recent fall  Discussed fall prevention   3. Hypertrophic cardiomyopathy (HCC)  Keep follow up with cardiologist   4. Paroxysmal A-fib (HCC)  Rate control, sinus rhythm today   5. Mesenteric vascular insufficiency (HCC)  She has intermittent diarrhea, no blood in stools   6. Benign essential HTN  - amLODipine (NORVASC) 10 MG tablet; Take 1 tablet (10 mg total) by mouth daily.  Dispense: 90 tablet; Refill: 1 - CBC with Differential/Platelet - COMPLETE METABOLIC PANEL WITH GFR  7. CHF NYHA class I, chronic, diastolic (HCC)   8. B12 deficiency  - Vitamin B12  9. Insomnia, persistent  - zolpidem (AMBIEN) 5 MG tablet; Take 1 tablet (5 mg total) by mouth at bedtime as needed for sleep.  Dispense: 90 tablet; Refill: 0  10. Dyslipidemia  - Lipid panel  11. Senile purpura (HCC)  Stable on arms  12. Major depression in remission (HCC)   13. Perennial allergic rhinitis  She is willing to try xyzal, she states nasal sprays never worked for him, tired of postnasal drainage - levocetirizine (XYZAL) 5 MG tablet; Take 1 tablet (5 mg total) by mouth every evening.  Dispense: 90 tablet; Refill: 0 - Ambulatory referral to ENT  14. Acute left-sided low back pain without sciatica  She would like to get x-ray  - DG Lumbar Spine Complete; Future  15. Cold intolerance  - TSH  16. Vitamin D deficiency  - VITAMIN D 25 Hydroxy (Vit-D Deficiency, Fractures)

## 2019-05-07 LAB — LIPID PANEL
Cholesterol: 151 mg/dL (ref <200)
HDL: 65 mg/dL (ref >=50)
LDL Cholesterol (Calc): 72 mg/dL (calc)
Non-HDL Cholesterol (Calc): 86 mg/dL (calc) (ref <130)
Total CHOL/HDL Ratio: 2.3 (calc) (ref <5.0)
Triglycerides: 48 mg/dL (ref <150)

## 2019-05-07 LAB — COMPLETE METABOLIC PANEL WITH GFR
AG Ratio: 1.3 (calc) (ref 1.0–2.5)
ALT: 17 U/L (ref 6–29)
AST: 22 U/L (ref 10–35)
Albumin: 3.7 g/dL (ref 3.6–5.1)
Alkaline phosphatase (APISO): 66 U/L (ref 37–153)
BUN/Creatinine Ratio: 23 (calc) — ABNORMAL HIGH (ref 6–22)
BUN: 28 mg/dL — ABNORMAL HIGH (ref 7–25)
CO2: 25 mmol/L (ref 20–32)
Calcium: 9 mg/dL (ref 8.6–10.4)
Chloride: 108 mmol/L (ref 98–110)
Creat: 1.22 mg/dL — ABNORMAL HIGH (ref 0.60–0.88)
GFR, Est African American: 47 mL/min/{1.73_m2} — ABNORMAL LOW (ref >=60)
GFR, Est Non African American: 41 mL/min/{1.73_m2} — ABNORMAL LOW (ref >=60)
Globulin: 2.9 g/dL (calc) (ref 1.9–3.7)
Glucose, Bld: 94 mg/dL (ref 65–99)
Potassium: 4.5 mmol/L (ref 3.5–5.3)
Sodium: 139 mmol/L (ref 135–146)
Total Bilirubin: 0.5 mg/dL (ref 0.2–1.2)
Total Protein: 6.6 g/dL (ref 6.1–8.1)

## 2019-05-07 LAB — CBC WITH DIFFERENTIAL/PLATELET
Absolute Monocytes: 357 cells/uL (ref 200–950)
Basophils Absolute: 19 cells/uL (ref 0–200)
Basophils Relative: 0.4 %
Eosinophils Absolute: 38 cells/uL (ref 15–500)
Eosinophils Relative: 0.8 %
HCT: 33 % — ABNORMAL LOW (ref 35.0–45.0)
Hemoglobin: 10.9 g/dL — ABNORMAL LOW (ref 11.7–15.5)
Lymphs Abs: 1095 cells/uL (ref 850–3900)
MCH: 30.8 pg (ref 27.0–33.0)
MCHC: 33 g/dL (ref 32.0–36.0)
MCV: 93.2 fL (ref 80.0–100.0)
MPV: 11.6 fL (ref 7.5–12.5)
Monocytes Relative: 7.6 %
Neutro Abs: 3191 cells/uL (ref 1500–7800)
Neutrophils Relative %: 67.9 %
Platelets: 122 10*3/uL — ABNORMAL LOW (ref 140–400)
RBC: 3.54 10*6/uL — ABNORMAL LOW (ref 3.80–5.10)
RDW: 12.5 % (ref 11.0–15.0)
Total Lymphocyte: 23.3 %
WBC: 4.7 10*3/uL (ref 3.8–10.8)

## 2019-05-07 LAB — TSH: TSH: 0.94 mIU/L (ref 0.40–4.50)

## 2019-05-07 LAB — VITAMIN D 25 HYDROXY (VIT D DEFICIENCY, FRACTURES): Vit D, 25-Hydroxy: 51 ng/mL (ref 30–100)

## 2019-05-07 LAB — VITAMIN B12: Vitamin B-12: 366 pg/mL (ref 200–1100)

## 2019-05-19 DIAGNOSIS — Z23 Encounter for immunization: Secondary | ICD-10-CM | POA: Diagnosis not present

## 2019-06-07 ENCOUNTER — Other Ambulatory Visit: Payer: Self-pay | Admitting: Family Medicine

## 2019-06-07 DIAGNOSIS — K219 Gastro-esophageal reflux disease without esophagitis: Secondary | ICD-10-CM

## 2019-06-08 ENCOUNTER — Telehealth: Payer: Self-pay | Admitting: Oncology

## 2019-06-08 NOTE — Telephone Encounter (Signed)
Requested Prescriptions  Pending Prescriptions Disp Refills  . omeprazole (PRILOSEC) 40 MG capsule [Pharmacy Med Name: OMEPRAZOLE 40MG  CAPSULES] 90 capsule 1    Sig: TAKE 1 CAPSULE(40 MG) BY MOUTH DAILY     Gastroenterology: Proton Pump Inhibitors Passed - 06/07/2019  7:06 PM      Passed - Valid encounter within last 12 months    Recent Outpatient Visits          1 month ago Dizziness   [facility], MD   4 months ago Paroxysmal A-fib [facility])   [facility], MD   6 months ago Acute respiratory failure with hypoxia [facility])   [facility], FNP   7 months ago Thrombocytopenia [facility])   [facility], MD   10 months ago Benign essential HTN   [facility], MD      Future Appointments            In 1 month Alba Cory, Carlynn Purl, MD [facility], [facility]   In 7 months  [facility], [facility]

## 2019-06-08 NOTE — Telephone Encounter (Signed)
Phoned patient to discuss changing appt to virtual visit. Patient stated that she would not be able to attend appt and cancelled. Patient to phone to reschedule. MD aware.

## 2019-06-09 DIAGNOSIS — Z23 Encounter for immunization: Secondary | ICD-10-CM | POA: Diagnosis not present

## 2019-06-10 ENCOUNTER — Inpatient Hospital Stay: Payer: Medicare Other | Admitting: Oncology

## 2019-06-10 ENCOUNTER — Inpatient Hospital Stay: Payer: Medicare Other

## 2019-07-18 ENCOUNTER — Other Ambulatory Visit: Payer: Self-pay | Admitting: Family Medicine

## 2019-07-18 DIAGNOSIS — I10 Essential (primary) hypertension: Secondary | ICD-10-CM

## 2019-07-31 ENCOUNTER — Other Ambulatory Visit: Payer: Self-pay | Admitting: Family Medicine

## 2019-07-31 DIAGNOSIS — J3089 Other allergic rhinitis: Secondary | ICD-10-CM

## 2019-07-31 NOTE — Telephone Encounter (Signed)
Requested Prescriptions  Pending Prescriptions Disp Refills  . levocetirizine (XYZAL) 5 MG tablet [Pharmacy Med Name: LEVOCETIRIZINE 5MG  TABLETS] 90 tablet 1    Sig: TAKE 1 TABLET(5 MG) BY MOUTH EVERY EVENING     Ear, Nose, and Throat:  Antihistamines Passed - 07/31/2019  3:04 PM      Passed - Valid encounter within last 12 months    Recent Outpatient Visits          2 months ago Dizziness   [facility], MD   5 months ago Paroxysmal A-fib [facility])   [facility], MD   7 months ago Acute respiratory failure with hypoxia [facility])   [facility], FNP   8 months ago Thrombocytopenia [facility])   [facility], MD   12 months ago Benign essential HTN   [facility], MD      Future Appointments            In 5 days Alba Cory, MD [facility], [facility]   In 6 months  [facility], [facility]

## 2019-08-05 ENCOUNTER — Telehealth: Payer: Self-pay | Admitting: Family Medicine

## 2019-08-05 ENCOUNTER — Ambulatory Visit: Payer: Medicare Other | Admitting: Family Medicine

## 2019-08-05 DIAGNOSIS — G4700 Insomnia, unspecified: Secondary | ICD-10-CM

## 2019-08-07 ENCOUNTER — Other Ambulatory Visit: Payer: Self-pay | Admitting: Family Medicine

## 2019-08-07 DIAGNOSIS — I10 Essential (primary) hypertension: Secondary | ICD-10-CM

## 2019-08-08 ENCOUNTER — Telehealth: Payer: Self-pay | Admitting: Family Medicine

## 2019-08-08 NOTE — Chronic Care Management (AMB) (Signed)
°  Care Management   Note  08/08/2019 Name: Sharon Bullock MRN: [id number] DOB: 05-05-1933  Sharon Bullock is a 86 y.o. year old female who is a primary care patient of Alba Cory, MD and is actively engaged with the care management team. I reached out to [name] by phone today to assist with scheduling an initial visit with the Pharmacist  Follow up plan: Telephone appointment with care management team member scheduled for:08/17/2019  Penne Lash, RMA Care Guide, Embedded Care Coordination [facility]  [place], [place] [address] Direct Dial: [phone] [email or web address] Website: [email or web address]

## 2019-08-08 NOTE — Telephone Encounter (Signed)
Called pt to make appt and the patient said that she would call back later to schedule.

## 2019-08-10 ENCOUNTER — Other Ambulatory Visit: Payer: Self-pay | Admitting: Emergency Medicine

## 2019-08-10 DIAGNOSIS — Z79899 Other long term (current) drug therapy: Secondary | ICD-10-CM

## 2019-08-11 DIAGNOSIS — D649 Anemia, unspecified: Secondary | ICD-10-CM | POA: Diagnosis not present

## 2019-08-11 DIAGNOSIS — R7402 Elevation of levels of lactic acid dehydrogenase (LDH): Secondary | ICD-10-CM | POA: Diagnosis not present

## 2019-08-11 DIAGNOSIS — R200 Anesthesia of skin: Secondary | ICD-10-CM | POA: Diagnosis not present

## 2019-08-11 DIAGNOSIS — I371 Nonrheumatic pulmonary valve insufficiency: Secondary | ICD-10-CM | POA: Diagnosis not present

## 2019-08-11 DIAGNOSIS — I083 Combined rheumatic disorders of mitral, aortic and tricuspid valves: Secondary | ICD-10-CM | POA: Diagnosis not present

## 2019-08-11 DIAGNOSIS — Z79899 Other long term (current) drug therapy: Secondary | ICD-10-CM | POA: Diagnosis not present

## 2019-08-11 DIAGNOSIS — H538 Other visual disturbances: Secondary | ICD-10-CM | POA: Diagnosis not present

## 2019-08-11 DIAGNOSIS — I422 Other hypertrophic cardiomyopathy: Secondary | ICD-10-CM | POA: Diagnosis not present

## 2019-08-11 DIAGNOSIS — I480 Paroxysmal atrial fibrillation: Secondary | ICD-10-CM | POA: Diagnosis not present

## 2019-08-17 ENCOUNTER — Ambulatory Visit: Payer: Medicare Other | Admitting: Pharmacist

## 2019-08-17 ENCOUNTER — Other Ambulatory Visit: Payer: Self-pay

## 2019-08-17 DIAGNOSIS — I5032 Chronic diastolic (congestive) heart failure: Secondary | ICD-10-CM

## 2019-08-17 DIAGNOSIS — G4700 Insomnia, unspecified: Secondary | ICD-10-CM

## 2019-08-17 NOTE — Patient Instructions (Signed)
Visit Information  Goals Addressed            This Visit's Progress   . Heart Failure       CARE PLAN ENTRY (see longitudinal plan of care for additional care plan information)  Current Barriers:  . Controlled heart failure, complicated by diabetes, hypertension . Current heart failure regimen: amiodarone, Coreg, irbesartan, spironolactone . Previous heart failure medications tried: furosemide . Patient reads package insert very carefully and has failed Eliquis and Pradaxa due to rare adverse events listed in the patient information  Pharmacist Clinical Goal(s):  Marland Kitchen Over the next 90 days, patient will work with PharmD and providers to optimize heart failure regimen . Ensure that patient is comfortable with Xarelto and can afford it  Interventions: . Inter-disciplinary care team collaboration (see longitudinal plan of care) . Comprehensive medication review performed; medication list updated in the electronic medical record.  . Explained that adverse events are rare and that the benefit of an anticoagulant far outweighs these issues  Patient Self Care Activities:  . Patient will not focus on the rare adverse events of a life saving medication . Patient will focus on medication adherence by remaining on Xarelto and tolerating minor adverse events  Initial goal documentation     . Hyperlipidemia - goal LDL < 70       CARE PLAN ENTRY (see longitudinal plan of care for additional care plan information)  Current Barriers:  . Technically Uncontrolled hyperlipidemia, complicated by heart failure, HTN . Current antihyperlipidemic regimen: Crestor 10mg  daily, Co-Q10 . Previous antihyperlipidemic medications tried NA . Most recent lipid panel:     Component Value Date/Time   CHOL 151 05/06/2019 1225   CHOL 192 08/17/2015 1115   TRIG 48 05/06/2019 1225   HDL 65 05/06/2019 1225   HDL 96 08/17/2015 1115   CHOLHDL 2.3 05/06/2019 1225   VLDL 13 05/26/2016 0957   LDLCALC 72  05/06/2019 1225 .   05/08/2019 ASCVD risk enhancing conditions: age >55, DM, HTN, CKD, CHF .  Pharmacist Clinical Goal(s):  >73 Over the next 90 days, patient will work with PharmD and providers towards optimized antihyperlipidemic therapy  Interventions: . Comprehensive medication review performed; medication list updated in electronic medical record.  Marland Kitchen care team collaboration (see longitudinal plan of care) . Explained that minor dietary changes can reduce the LDL by at least 3 points to achieve goal.  Patient Self Care Activities:  . Patient will focus on medication adherence by eating home cooked meals and limit snacking  Initial goal documentation     . Insomnia - goal to feel rested        CARE PLAN ENTRY (see longitudinal plan of care for additional care plan information)  Current Barriers:  . Polypharmacy; complex patient with multiple comorbidities including heart failure, hyperlipidemia, and hypertension . Was able to sleep with Ambien 10mg  nightly, however guidelines for age and gender limit the dosage to 5mg  nightly . Unfortunately she is still having trouble sleeping on Ambien 5mg  nightly   Pharmacist Clinical Goal(s):  Sharon Bullock Over the next 30 days, patient will work with PharmD and provider towards better sleep through sleep hygiene and melatonin OTC . Over the next 90 days potentially discontinue Ambien entirely  Interventions: . Comprehensive medication review performed; medication list updated in electronic medical record . Inter-disciplinary care team collaboration (see longitudinal plan of care) . Melatonin 1 mg nightly to supplement Ambien  Patient Self Care Activities:  . Patient will purchase OTC melatonin  1 mg and take nightly with her Ambien  . In 90 days, attempt sleep without Ambien  Initial goal documentation        Sharon Bullock was given information about Chronic Care Management services today including:  1. CCM service includes personalized  support from designated clinical staff supervised by her physician, including individualized plan of care and coordination with other care providers 2. 24/7 contact phone numbers for assistance for urgent and routine care needs. 3. Standard insurance, coinsurance, copays and deductibles apply for chronic care management only during months in which we provide at least 20 minutes of these services. Most insurances cover these services at 100%, however patients may be responsible for any copay, coinsurance and/or deductible if applicable. This service may help you avoid the need for more expensive face-to-face services. 4. Only one practitioner may furnish and bill the service in a calendar month. 5. The patient may stop CCM services at any time (effective at the end of the month) by phone call to the office staff.  Patient agreed to services and verbal consent obtained.   Print copy of patient instructions provided.  Telephone follow up appointment with pharmacy team member scheduled for: 1 month  Felton Clinton, PharmD, [place], CTTS Clinical Pharmacist [facility] [phone] Insomnia Insomnia is a sleep disorder that makes it difficult to fall asleep or stay asleep. Insomnia can cause fatigue, low energy, difficulty concentrating, mood swings, and poor performance at work or school. There are three different ways to classify insomnia:  Difficulty falling asleep.  Difficulty staying asleep.  Waking up too early in the morning. Any type of insomnia can be long-term (chronic) or short-term (acute). Both are common. Short-term insomnia usually lasts for three months or less. Chronic insomnia occurs at least three times a week for longer than three months. What are the causes? Insomnia may be caused by another condition, situation, or substance, such as:  Anxiety.  Certain medicines.  Gastroesophageal reflux disease (GERD) or other gastrointestinal conditions.  Asthma or other  breathing conditions.  Restless legs syndrome, sleep apnea, or other sleep disorders.  Chronic pain.  Menopause.  Stroke.  Abuse of alcohol, tobacco, or illegal drugs.  Mental health conditions, such as depression.  Caffeine.  Neurological disorders, such as Alzheimer's disease.  An overactive thyroid (hyperthyroidism). Sometimes, the cause of insomnia may not be known. What increases the risk? Risk factors for insomnia include:  Gender. Women are affected more often than men.  Age. Insomnia is more common as you get older.  Stress.  Lack of exercise.  Irregular work schedule or working night shifts.  Traveling between different time zones.  Certain medical and mental health conditions. What are the signs or symptoms? If you have insomnia, the main symptom is having trouble falling asleep or having trouble staying asleep. This may lead to other symptoms, such as:  Feeling fatigued or having low energy.  Feeling nervous about going to sleep.  Not feeling rested in the morning.  Having trouble concentrating.  Feeling irritable, anxious, or depressed. How is this diagnosed? This condition may be diagnosed based on:  Your symptoms and medical history. Your health care provider may ask about: ? Your sleep habits. ? Any medical conditions you have. ? Your mental health.  A physical exam. How is this treated? Treatment for insomnia depends on the cause. Treatment may focus on treating an underlying condition that is causing insomnia. Treatment may also include:  Medicines to help you sleep.  Counseling or  therapy.  Lifestyle adjustments to help you sleep better. Follow these instructions at home: Eating and drinking   Limit or avoid alcohol, caffeinated beverages, and cigarettes, especially close to bedtime. These can disrupt your sleep.  Do not eat a large meal or eat spicy foods right before bedtime. This can lead to digestive discomfort that can make  it hard for you to sleep. Sleep habits   Keep a sleep diary to help you and your health care provider figure out what could be causing your insomnia. Write down: ? When you sleep. ? When you wake up during the night. ? How well you sleep. ? How rested you feel the next day. ? Any side effects of medicines you are taking. ? What you eat and drink.  Make your bedroom a dark, comfortable place where it is easy to fall asleep. ? Put up shades or blackout curtains to block light from outside. ? Use a white noise machine to block noise. ? Keep the temperature cool.  Limit screen use before bedtime. This includes: ? Watching TV. ? Using your smartphone, tablet, or computer.  Stick to a routine that includes going to bed and waking up at the same times every day and night. This can help you fall asleep faster. Consider making a quiet activity, such as reading, part of your nighttime routine.  Try to avoid taking naps during the day so that you sleep better at night.  Get out of bed if you are still awake after 15 minutes of trying to sleep. Keep the lights down, but try reading or doing a quiet activity. When you feel sleepy, go back to bed. General instructions  Take over-the-counter and prescription medicines only as told by your health care provider.  Exercise regularly, as told by your health care provider. Avoid exercise starting several hours before bedtime.  Use relaxation techniques to manage stress. Ask your health care provider to suggest some techniques that may work well for you. These may include: ? Breathing exercises. ? Routines to release muscle tension. ? Visualizing peaceful scenes.  Make sure that you drive carefully. Avoid driving if you feel very sleepy.  Keep all follow-up visits as told by your health care provider. This is important. Contact a health care provider if:  You are tired throughout the day.  You have trouble in your daily routine due to  sleepiness.  You continue to have sleep problems, or your sleep problems get worse. Get help right away if:  You have serious thoughts about hurting yourself or someone else. If you ever feel like you may hurt yourself or others, or have thoughts about taking your own life, get help right away. You can go to your nearest emergency department or call:  Your local emergency services (911 in the [place]  A suicide crisis helpline, such as the [place] at 709-639-2456. This is open 24 hours a day. Summary  Insomnia is a sleep disorder that makes it difficult to fall asleep or stay asleep.  Insomnia can be long-term (chronic) or short-term (acute).  Treatment for insomnia depends on the cause. Treatment may focus on treating an underlying condition that is causing insomnia.  Keep a sleep diary to help you and your health care provider figure out what could be causing your insomnia. This information is not intended to replace advice given to you by your health care provider. Make sure you discuss any questions you have with your health care provider. Document Revised:  03/13/2017 Document Reviewed: 01/08/2017 Elsevier Patient Education  2020 [employer].

## 2019-08-17 NOTE — Chronic Care Management (AMB) (Signed)
Chronic Care Management Pharmacy  Name: Sharon Bullock  MRN: [id number] DOB: 14-Mar-1934  Chief Complaint/ HPI  Sharon Bullock,  86 y.o. , female presents for their Initial CCM visit with the clinical pharmacist via telephone due to COVID-19 Pandemic.  PCP : Steele Sizer, MD  Their chronic conditions include: HTN, Afib, HF, HTN  Office Visits: 1/22 dizziness, [place], BP 122/60- P 62 Ht 65" Wt 149 BMI 24.79, failed trazodone, on Ambien 5mg  due to insurance  Consult Visit: 11/9 cardiomyopathy, Mina Mar[place],   Medications: Outpatient Encounter Medications as of 08/17/2019  Medication Sig  . amiodarone (PACERONE) 200 MG tablet Take 1 tablet by mouth daily.  Marland Kitchen amLODipine (NORVASC) 10 MG tablet Take 1 tablet (10 mg total) by mouth daily.  . calcium-vitamin D (OSCAL WITH D) 500-200 MG-UNIT tablet Take 1 tablet by mouth 2 (two) times daily.  . carvedilol (COREG) 25 MG tablet TAKE 1 TABLET(25 MG) BY MOUTH TWICE DAILY WITH A MEAL  . ferrous gluconate (FERGON) 324 MG tablet Take 324 mg by mouth daily with breakfast.  . folic acid (FOLVITE) 332 MCG tablet Take 400 mcg by mouth daily.   . irbesartan (AVAPRO) 300 MG tablet Take 1 tablet (300 mg total) by mouth daily.  Marland Kitchen loratadine (CLARITIN) 10 MG tablet Take 1 tablet (10 mg total) by mouth daily.  Marland Kitchen omeprazole (PRILOSEC) 40 MG capsule TAKE 1 CAPSULE(40 MG) BY MOUTH DAILY  . potassium chloride SA (K-DUR) 20 MEQ tablet Take 1 tablet (20 mEq total) by mouth daily.  . rosuvastatin (CRESTOR) 10 MG tablet TAKE 1 TABLET(10 MG) BY MOUTH DAILY  . solifenacin (VESICARE) 5 MG tablet Take 1 tablet (5 mg total) by mouth daily.  Marland Kitchen spironolactone (ALDACTONE) 25 MG tablet Take 1 tablet by mouth daily.  . Vitamin D, Cholecalciferol, 1000 UNITS TABS Take 1,000 Units by mouth daily.   Marland Kitchen zolpidem (AMBIEN) 5 MG tablet TAKE 1 TABLET(5 MG) BY MOUTH AT BEDTIME AS NEEDED FOR SLEEP  . co-enzyme Q-10 30 MG capsule Take 30 mg by mouth daily.   . diclofenac Sodium (VOLTAREN) 1 %  GEL Wrist twice daily  . ELIQUIS 5 MG TABS tablet Take 1 tablet by mouth 2 (two) times daily.  . furosemide (LASIX) 20 MG tablet TAKE 1 TABLET(20 MG) BY MOUTH DAILY (Patient not taking: Reported on 08/17/2019)  . levocetirizine (XYZAL) 5 MG tablet TAKE 1 TABLET(5 MG) BY MOUTH EVERY EVENING (Patient not taking: Reported on 08/17/2019)   Facility-Administered Encounter Medications as of 08/17/2019  Medication  . cyanocobalamin ((VITAMIN B-12)) injection 1,000 mcg     Current Diagnosis/Assessment:  Goals Addressed            This Visit's Progress   . Heart Failure       CARE PLAN ENTRY (see longitudinal plan of care for additional care plan information)  Current Barriers:  . Controlled heart failure, complicated by diabetes, hypertension . Current heart failure regimen: amiodarone, Coreg, irbesartan, spironolactone . Previous heart failure medications tried: furosemide . Patient reads package insert very carefully and has failed Eliquis and Pradaxa due to rare adverse events listed in the patient information  Pharmacist Clinical Goal(s):  Marland Kitchen Over the next 90 days, patient will work with PharmD and providers to optimize heart failure regimen . Ensure that patient is comfortable with Xarelto and can afford it  Interventions: . Inter-disciplinary care team collaboration (see longitudinal plan of care) . Comprehensive medication review performed; medication list updated in the electronic medical record.  . Explained that  adverse events are rare and that the benefit of an anticoagulant far outweighs these issues  Patient Self Care Activities:  . Patient will not focus on the rare adverse events of a life saving medication . Patient will focus on medication adherence by remaining on Xarelto and tolerating minor adverse events  Initial goal documentation     . Hyperlipidemia - goal LDL < 70       CARE PLAN ENTRY (see longitudinal plan of care for additional care plan  information)  Current Barriers:  . Technically Uncontrolled hyperlipidemia, complicated by heart failure, HTN . Current antihyperlipidemic regimen: Crestor 10mg  daily, Co-Q10 . Previous antihyperlipidemic medications tried NA . Most recent lipid panel:     Component Value Date/Time   CHOL 151 05/06/2019 1225   CHOL 192 08/17/2015 1115   TRIG 48 05/06/2019 1225   HDL 65 05/06/2019 1225   HDL 96 08/17/2015 1115   CHOLHDL 2.3 05/06/2019 1225   VLDL 13 05/26/2016 0957   LDLCALC 72 05/06/2019 1225 .   05/08/2019 ASCVD risk enhancing conditions: age >35, DM, HTN, CKD, CHF .  Pharmacist Clinical Goal(s):  >73 Over the next 90 days, patient will work with PharmD and providers towards optimized antihyperlipidemic therapy  Interventions: . Comprehensive medication review performed; medication list updated in electronic medical record.  Marland Kitchen care team collaboration (see longitudinal plan of care) . Explained that minor dietary changes can reduce the LDL by at least 3 points to achieve goal.  Patient Self Care Activities:  . Patient will focus on medication adherence by eating home cooked meals and limit snacking  Initial goal documentation     . Insomnia - goal to feel rested        CARE PLAN ENTRY (see longitudinal plan of care for additional care plan information)  Current Barriers:  . Polypharmacy; complex patient with multiple comorbidities including heart failure, hyperlipidemia, and hypertension . Was able to sleep with Ambien 10mg  nightly, however guidelines for age and gender limit the dosage to 5mg  nightly . Unfortunately she is still having trouble sleeping on Ambien 5mg  nightly   Pharmacist Clinical Goal(s):  [name] Over the next 30 days, patient will work with PharmD and provider towards better sleep through sleep hygiene and melatonin OTC . Over the next 90 days potentially discontinue Ambien entirely  Interventions: . Comprehensive medication review performed;  medication list updated in electronic medical record . Inter-disciplinary care team collaboration (see longitudinal plan of care) . Melatonin 1 mg nightly to supplement Ambien  Patient Self Care Activities:  . Patient will purchase OTC melatonin 1 mg and take nightly with her Ambien  . In 90 days, attempt sleep without Ambien  Initial goal documentation         Financial Resource Strain: High Risk  . Difficulty of Paying Living Expenses: Very hard   Hyperlipidemia   Lipid Panel     Component Value Date/Time   CHOL 151 05/06/2019 1225   CHOL 192 08/17/2015 1115   TRIG 48 05/06/2019 1225   HDL 65 05/06/2019 1225   HDL 96 08/17/2015 1115   CHOLHDL 2.3 05/06/2019 1225   VLDL 13 05/26/2016 0957   LDLCALC 72 05/06/2019 1225   LABVLDL 11 08/17/2015 1115     The ASCVD Risk score (Goff DC Jr., et al., 2013) failed to calculate for the following reasons:   The 2013 ASCVD risk score is only valid for ages 59 to 24   Patient has failed these meds in past: NA  Patient is currently controlled on the following medications: Crestor, Co-Q10  We discussed:  Explained why she is taking Co-Q10. Counseled that she is very close to her goal LDL.  Plan  Continue current medications  Support statin with dietary control  Hypertension    Office blood pressures are  BP Readings from Last 3 Encounters:  05/06/19 122/60  02/04/19 108/72  01/25/19 132/76    Patient has failed these meds in the past: NA Patient has BP control on these meds: amlodipine, irbesartan, Coreg, spironolactone  Patient checks BP at home infrequently   We discussed: The role of each BP medication and benefits vs adverse events.  Plan  Continue current medications     Heart Failure   Type: Diastolic   NYHA Class: I (no actitivty limitation) AHA HF Stage: C (Heart disease and symptoms present)  Patient has failed these meds in past: Eliquis, Pradaxa Patient is currently controlled on the following  medications: amiodarone, Eliquis, Coreg, spironolactone  Switching from Eliquis (blurry vision) to Xarelto 20mg  daily Pradaxa cold feet, heart burn. Patient spends many hours pouring over the package insert until she finds a side effect that scares her.  Amiodarone no adverse events, years Taking spironolactone every other day (minimum)  We discussed Exercises 20 minutes daily, long term adverse effects of amiodarone  Plan  Continue current medications  Patient not to over emphasize the adverse events on the Xarelto. Counseled that we are running out of blood thinners she can take.  Insomnia   Patient has failed these meds in past: trazodone Patient is currently uncontrolled on the following medications: ambien 5mg    We discussed:  Adverse effects of Ambien which was reduced for age and gender. Insurance will not cover 10mg  and her insomnia persists. Explained that melatonin is a hormone naturally found in the body. It may help her get to sleep and reset her internal clock.  Plan  Continue current medications    Medication Management   Pt uses [facility] pharmacy for all medications Uses pill box? Yes Pt endorses 100% compliance  We discussed: organizer Xyzal not every day adverse events Stop taking levocetirizine (very confused at this point)  Plan  Continue current medication management strategy  Interested in [facility] but she then had difficulty understanding the packaging, the med sync, how delivery works. Could not obtain clear informed consent at this time. Will try during follow up.   Follow up: 1 month phone visit  , PharmD, [place], CTTS Clinical Pharmacist [facility] [phone]

## 2019-08-18 ENCOUNTER — Other Ambulatory Visit: Payer: Self-pay | Admitting: Family Medicine

## 2019-08-23 ENCOUNTER — Ambulatory Visit: Payer: Medicare Other | Admitting: Family Medicine

## 2019-08-25 ENCOUNTER — Encounter: Payer: Self-pay | Admitting: Family Medicine

## 2019-08-25 ENCOUNTER — Ambulatory Visit (INDEPENDENT_AMBULATORY_CARE_PROVIDER_SITE_OTHER): Payer: Medicare Other | Admitting: Family Medicine

## 2019-08-25 ENCOUNTER — Other Ambulatory Visit: Payer: Self-pay

## 2019-08-25 VITALS — BP 136/68 | HR 87 | Temp 96.6°F | Resp 16 | Ht 65.0 in | Wt 151.3 lb

## 2019-08-25 DIAGNOSIS — I5032 Chronic diastolic (congestive) heart failure: Secondary | ICD-10-CM

## 2019-08-25 DIAGNOSIS — D692 Other nonthrombocytopenic purpura: Secondary | ICD-10-CM

## 2019-08-25 DIAGNOSIS — I422 Other hypertrophic cardiomyopathy: Secondary | ICD-10-CM | POA: Diagnosis not present

## 2019-08-25 DIAGNOSIS — I480 Paroxysmal atrial fibrillation: Secondary | ICD-10-CM | POA: Diagnosis not present

## 2019-08-25 DIAGNOSIS — D696 Thrombocytopenia, unspecified: Secondary | ICD-10-CM

## 2019-08-25 DIAGNOSIS — E538 Deficiency of other specified B group vitamins: Secondary | ICD-10-CM | POA: Diagnosis not present

## 2019-08-25 DIAGNOSIS — E785 Hyperlipidemia, unspecified: Secondary | ICD-10-CM | POA: Diagnosis not present

## 2019-08-25 DIAGNOSIS — G4700 Insomnia, unspecified: Secondary | ICD-10-CM | POA: Diagnosis not present

## 2019-08-25 DIAGNOSIS — I10 Essential (primary) hypertension: Secondary | ICD-10-CM

## 2019-08-25 DIAGNOSIS — D638 Anemia in other chronic diseases classified elsewhere: Secondary | ICD-10-CM | POA: Diagnosis not present

## 2019-08-25 DIAGNOSIS — N1831 Chronic kidney disease, stage 3a: Secondary | ICD-10-CM

## 2019-08-25 MED ORDER — AMLODIPINE BESYLATE 10 MG PO TABS: 10.0000 mg | ORAL_TABLET | Freq: Every day | ORAL | 1 refills | Status: DC

## 2019-08-25 MED ORDER — IRBESARTAN 300 MG PO TABS: 300.0000 mg | ORAL_TABLET | Freq: Every day | ORAL | 1 refills | Status: DC

## 2019-08-25 MED ORDER — ZOLPIDEM TARTRATE 5 MG PO TABS: 5.0000 mg | ORAL_TABLET | Freq: Every day | ORAL | 0 refills | Status: DC

## 2019-08-25 MED ORDER — ROSUVASTATIN CALCIUM 10 MG PO TABS: 10.0000 mg | ORAL_TABLET | Freq: Every day | ORAL | 1 refills | Status: DC

## 2019-08-25 NOTE — Progress Notes (Signed)
Name: Sharon Bullock   MRN: [id number]    DOB: [birth year]   Date:08/25/2019       Progress Note  Subjective  Chief Complaint  Chief Complaint  Patient presents with  . Medication Management    HPI  HTN: bp is at goal today, she denies chest pain but has noticed increase in  palpitation, she has some sob with activity.   Carotid atherosclerosis: found during MRI head done at [facility] at [facility] for evaluation of vertigo in 12/2015. On medical management at this time. She also has mesenteric insufficiency, she denies abdominal pain or blood in stools.Taking Crestor and currently on Xarelto ( but having side effects and may change soon) no side effects of medications  Afib/CHF: she sees a cardiologist - Dr. Regino Schultze at [facility]. She has been on Pradaxa but recently changed to Xarelto and now having worse side effects and also worried about the cost. Last admission for CHF was August 2020.  She had an echo that showed ventricular hypertrophy and normal EF, diastolic CHF. She has contacted Dr. Regino Schultze . Discussed going back on coumadin , but she states she had a hard time keeping INR at goal because of diet . She has been tired, she has chronic orthopnea, not much lower extremity swelling  INTERPRETATION 04/29 2021 at [facility]   NORMAL LEFT VENTRICULAR SYSTOLIC FUNCTION WITH MODERATE LVH  NORMAL RIGHT VENTRICULAR SYSTOLIC FUNCTION  VALVULAR REGURGITATION: MILD AR, MODERATE MR, MILD PR, MODERATE TR  NO VALVULAR STENOSIS  TRIVIAL PERICARDIAL EFFUSION  Insomnia: She tried Trazodone without help, only able to sleep one hour per night,she is on Ambien 10 mg, tried Ambien 5 mg without much help, but insurance stopped paying for it, so she is back on 5 mg. Diuretics causes nocturia . Unchanged   Hypertrophic cardiomyopathy and hypertensive pulmonary vascular disease: she sees Dr. Regino Schultze, no chest pain, she has sob with activity  orparoxysmal nocturnal dyspnea, the bilateral swellingon legs is stable, better in am  and worse at night.She is on ARB, beta-blocker, no on aspirin because she is on anti-coagulants.  Dyslipidemia: she is on Crestor and CoQ 10, denies side effects of medication.  Thrombocytopenia: she was seeing hematologist but no recent visits. she is still getting B12 injections because of history of pernicious anemia. She also has normocitic anemia from CKD, she denies fatigue.   MDD: severe episode last year that lasted months,she states she is in remission.   Perennial allergic rhinitis: she has post-nasal drainage , usually clear to cloudy in color and sometimes it makes her feel sob. She is taking Xyzal.   Side effects of Xarelto: she states Pradaxa was causing her to feel cold all the time, Dr. Regino Schultze changed from Pradaxa to Xarelto 07/2019 and states since the change she has noticed blurred vision, joint aches, palpitation. Asked if she would like for me to check labs but she states it is the medication and will contact Dr. Regino Schultze to change it again to something else. She already called him.   CKI stage III: her kidney function has dropped a little, advised her to go back to nephrologist, normal urine output  Patient Active Problem List   Diagnosis Date Noted  . Pericardial effusion 04/18/2019  . Moderate major depression, single episode (HCC) 08/03/2018  . Thrombocytopenia (HCC) 05/27/2016  . CHF NYHA class I, chronic, diastolic (HCC) 05/26/2016  . Mesenteric vascular insufficiency (HCC) 02/28/2016  . Pancreas cyst 02/28/2016  . Increased endometrial stripe thickness 02/28/2016  . Carotid atherosclerosis,  bilateral 01/23/2016  . Hearing loss 08/17/2015  . Mitral regurgitation 04/19/2015  . Pernicious anemia 09/27/2014  . Paroxysmal A-fib ([place]) 09/27/2014  . Benign essential HTN 09/27/2014  . Perennial allergic rhinitis 09/27/2014  . Chronic anemia 09/27/2014  . Insomnia, persistent 09/27/2014  . Hypertensive pulmonary vascular disease ([place]) 09/27/2014  . B12 deficiency  09/27/2014  . Diverticulosis of colon 09/27/2014  . Dyslipidemia 09/27/2014  . Edema extremities 09/27/2014  . Gastric reflux 09/27/2014  . Bilateral hearing loss 09/27/2014  . Polypharmacy 09/27/2014  . Pelvic pain in female 09/27/2014  . MI (mitral incompetence) 09/27/2014  . Internal hemorrhoids 09/27/2014  . Osteopenia 09/27/2014  . Arthralgia of shoulder 09/27/2014  . Finger tendinitis 09/27/2014  . Urge incontinence 09/27/2014  . Cervical prolapse 09/27/2014  . Vertigo 09/27/2014  . Vitamin D deficiency 09/27/2014  . Bladder cystocele 04/15/2012  . Asymmetric septal hypertrophy ([place]) 05/19/2011  . Hypertrophic cardiomyopathy ([place]) 05/19/2011    Past Surgical History:  Procedure Laterality Date  . BLADDER REPAIR  2014   tact    Family History  Problem Relation Age of Onset  . Alzheimer's disease Mother   . Hypertension Mother   . Stroke Father   . Heart disease Father   . Heart disease Brother   . Hypertension Brother   . Cancer Daughter        breast  . Hypertension Daughter   . Breast cancer Daughter   . Diabetes Brother   . Heart disease Brother   . Heart disease Brother   . Diabetes Brother   . Hypertension Brother   . Heart disease Brother   . Hypertension Brother   . Cancer Brother        prostate  . Diabetes Brother   . Heart disease Brother   . Hypertension Brother   . Cancer Brother        prostate    Social History   Tobacco Use  . Smoking status: Never Smoker  . Smokeless tobacco: Never Used  . Tobacco comment: smoking cessation materials not required  Substance Use Topics  . Alcohol use: No    Alcohol/week: 0.0 standard drinks     Current Outpatient Medications:  .  amiodarone (PACERONE) 200 MG tablet, Take 1 tablet by mouth daily., Disp: , Rfl:  .  amLODipine (NORVASC) 10 MG tablet, Take 1 tablet (10 mg total) by mouth daily., Disp: 90 tablet, Rfl: 1 .  calcium-vitamin D (OSCAL WITH D) 500-200 MG-UNIT tablet, Take 1 tablet by  mouth 2 (two) times daily., Disp: , Rfl:  .  carvedilol (COREG) 25 MG tablet, TAKE 1 TABLET(25 MG) BY MOUTH TWICE DAILY WITH A MEAL, Disp: 180 tablet, Rfl: 1 .  co-enzyme Q-10 30 MG capsule, Take 30 mg by mouth daily. , Disp: , Rfl:  .  diclofenac Sodium (VOLTAREN) 1 % GEL, Wrist twice daily, Disp: , Rfl:  .  ferrous gluconate (FERGON) 324 MG tablet, Take 324 mg by mouth daily with breakfast., Disp: , Rfl:  .  folic acid (FOLVITE) 025 MCG tablet, Take 400 mcg by mouth daily. , Disp: , Rfl:  .  irbesartan (AVAPRO) 300 MG tablet, Take 1 tablet (300 mg total) by mouth daily., Disp: 90 tablet, Rfl: 1 .  levocetirizine (XYZAL) 5 MG tablet, TAKE 1 TABLET(5 MG) BY MOUTH EVERY EVENING, Disp: 90 tablet, Rfl: 1 .  loratadine (CLARITIN) 10 MG tablet, Take 1 tablet (10 mg total) by mouth daily., Disp: 30 tablet, Rfl: 11 .  omeprazole (  PRILOSEC) 40 MG capsule, TAKE 1 CAPSULE(40 MG) BY MOUTH DAILY, Disp: 90 capsule, Rfl: 1 .  potassium chloride SA (K-DUR) 20 MEQ tablet, Take 1 tablet (20 mEq total) by mouth daily., Disp: 30 tablet, Rfl: 0 .  rosuvastatin (CRESTOR) 10 MG tablet, TAKE 1 TABLET(10 MG) BY MOUTH DAILY, Disp: 90 tablet, Rfl: 1 .  solifenacin (VESICARE) 5 MG tablet, Take 1 tablet (5 mg total) by mouth daily., Disp: 30 tablet, Rfl: 2 .  spironolactone (ALDACTONE) 25 MG tablet, Take 1 tablet by mouth daily., Disp: , Rfl:  .  Vitamin D, Cholecalciferol, 1000 UNITS TABS, Take 1,000 Units by mouth daily. , Disp: , Rfl:  .  XARELTO 20 MG TABS tablet, Take 20 mg by mouth at bedtime., Disp: , Rfl:  .  zolpidem (AMBIEN) 5 MG tablet, TAKE 1 TABLET(5 MG) BY MOUTH AT BEDTIME AS NEEDED FOR SLEEP, Disp: 30 tablet, Rfl: 0  Current Facility-Administered Medications:  .  cyanocobalamin ((VITAMIN B-12)) injection 1,000 mcg, 1,000 mcg, Intramuscular, Q30 days, Alba Cory, MD, 1,000 mcg at 08/25/19 1145  Allergies  Allergen Reactions  . Nickel Dermatitis and Swelling  . Other Anaphylaxis    Uncoded Allergy.  Allergen: SHELLFISH  . Ace Inhibitors Cough    I personally reviewed active problem list, medication list, allergies, family history, social history, health maintenance with the patient/caregiver today.   ROS  Constitutional: Negative for fever or weight change.  Respiratory: Negative for cough , positive for mild  shortness of breath.   Cardiovascular: Negative for chest pain , positive for palpitations.  Gastrointestinal: Negative for abdominal pain, no bowel changes.  Musculoskeletal: positive  for gait problem -uses a cane, but no  joint swelling.  Skin: Negative for rash.  Neurological: Negative for dizziness or headache.  No other specific complaints in a complete review of systems (except as listed in HPI above).  Objective  Vitals:   08/25/19 1129  BP: 136/68  Pulse: 87  Resp: 16  Temp: (!) 96.6 F (35.9 C)  TempSrc: Temporal  SpO2: 98%  Weight: 151 lb 4.8 oz (68.6 kg)  Height: 5\' 5"  (1.651 m)    Body mass index is 25.18 kg/m.  Physical Exam  Constitutional: Patient appears well-developed and well-nourished.  No distress.  HEENT: head atraumatic, normocephalic, pupils equal and reactive to light Cardiovascular: Normal rate, regular rhythm and normal heart sounds.  2/6  murmur heard. Trace BLE edema. Pulmonary/Chest: Effort normal , she has crackles on both lung bases . No respiratory distress. Muscular skeletal: no synovitis, uses a cane to assist with balance  Abdominal: Soft.  There is no tenderness. Psychiatric: Patient has a normal mood and affect. behavior is normal. She seems a little repetitive, discussed possible cognitive dysfunction and may need referral to neurologist   PHQ2/9: Depression screen [facility] 2/9 08/25/2019 05/06/2019 02/04/2019 01/25/2019 12/08/2018  Decreased Interest 0 0 0 0 0  Down, Depressed, Hopeless 0 0 0 0 0  PHQ - 2 Score 0 0 0 0 0  Altered sleeping 0 0 0 - 0  Tired, decreased energy 0 0 0 - 0  Change in appetite 0 0 0 - 0  Feeling  bad or failure about yourself  0 0 0 - 0  Trouble concentrating 0 0 0 - 0  Moving slowly or fidgety/restless 0 0 0 - 0  Suicidal thoughts 0 0 0 - 0  PHQ-9 Score 0 0 0 - 0  Difficult doing work/chores - - Not difficult at all - Not  difficult at all  Some recent data might be hidden    phq 9 is negative   Fall Risk: Fall Risk  08/25/2019 05/06/2019 02/04/2019 01/25/2019 12/08/2018  Falls in the past year? 0 1 0 0 0  Number falls in past yr: 0 0 0 0 0  Injury with Fall? 0 1 0 0 0  Risk for fall due to : - History of fall(s) - - -  Risk for fall due to: Comment - - - - -  Follow up - Falls evaluation completed - Falls prevention discussed Falls evaluation completed      Functional Status Survey: Is the patient deaf or have difficulty hearing?: Yes Does the patient have difficulty seeing, even when wearing glasses/contacts?: Yes Does the patient have difficulty concentrating, remembering, or making decisions?: No Does the patient have difficulty walking or climbing stairs?: Yes Does the patient have difficulty dressing or bathing?: No Does the patient have difficulty doing errands alone such as visiting a doctor's office or shopping?: No    Assessment & Plan  1. Dyslipidemia  - rosuvastatin (CRESTOR) 10 MG tablet; Take 1 tablet (10 mg total) by mouth daily.  Dispense: 90 tablet; Refill: 1  2. Benign essential HTN  - amLODipine (NORVASC) 10 MG tablet; Take 1 tablet (10 mg total) by mouth daily.  Dispense: 90 tablet; Refill: 1 - irbesartan (AVAPRO) 300 MG tablet; Take 1 tablet (300 mg total) by mouth daily.  Dispense: 90 tablet; Refill: 1  3. Insomnia, persistent  - zolpidem (AMBIEN) 5 MG tablet; Take 1 tablet (5 mg total) by mouth at bedtime.  Dispense: 90 tablet; Refill: 0  4. B12 deficiency  B12 today   5. Anemia of chronic disease  stable  6. Thrombocytopenia (HCC)  stable  7. Senile purpura (HCC)  Both arms, she is taking Xarelto   8. Hypertrophic  cardiomyopathy (HCC)   9. CHF NYHA class I, chronic, diastolic (HCC)  Crackles on base and feeling tired, discussed getting CXR and BNP but she states she will contact Dr. Regino Schultze today, that is secondary to Xarelto, explained fatigue, feeling cold not necessarily side effect of medication. She did not agree with me   10. Paroxysmal A-fib (HCC)  Having palpitation but rate control   11. Stage 3a chronic kidney disease  We will monitor for now, but if gets worse we will refer her to nephrologist, we will also check more labs on her next visit

## 2019-09-15 ENCOUNTER — Ambulatory Visit: Payer: Medicare Other | Admitting: Pharmacist

## 2019-09-15 ENCOUNTER — Other Ambulatory Visit: Payer: Self-pay

## 2019-09-15 DIAGNOSIS — I480 Paroxysmal atrial fibrillation: Secondary | ICD-10-CM

## 2019-09-15 DIAGNOSIS — Z79899 Other long term (current) drug therapy: Secondary | ICD-10-CM

## 2019-09-15 NOTE — Progress Notes (Signed)
   Chronic Care Management Pharmacy  Name: Sharon Bullock  MRN: [id number] DOB: Nov 04, 1933  Chief Complaint/ HPI  Sharon Bullock,  86 y.o. , female presents for their Follow-Up CCM visit with the clinical pharmacist via telephone due to COVID-19 Pandemic.  PCP : Alba Cory, MD  Their chronic conditions include: Afib  Office Visits: 5/13 B12, [place], BP 136/68 P 87 Wt 151 BMI 25.18, Xarelto blurred vision, joint pain, palpitation, failed trazodone, CKD 3a slightly worse  Consult Visit: NA  Medications: Outpatient Encounter Medications as of 09/15/2019  Medication Sig  . amiodarone (PACERONE) 200 MG tablet Take 1 tablet by mouth daily.  Marland Kitchen amLODipine (NORVASC) 10 MG tablet Take 1 tablet (10 mg total) by mouth daily.  . calcium-vitamin D (OSCAL WITH D) 500-200 MG-UNIT tablet Take 1 tablet by mouth 2 (two) times daily.  . carvedilol (COREG) 25 MG tablet TAKE 1 TABLET(25 MG) BY MOUTH TWICE DAILY WITH A MEAL  . co-enzyme Q-10 30 MG capsule Take 30 mg by mouth daily.   . diclofenac Sodium (VOLTAREN) 1 % GEL Wrist twice daily  . ferrous gluconate (FERGON) 324 MG tablet Take 324 mg by mouth daily with breakfast.  . folic acid (FOLVITE) 400 MCG tablet Take 400 mcg by mouth daily.   . irbesartan (AVAPRO) 300 MG tablet Take 1 tablet (300 mg total) by mouth daily.  Marland Kitchen levocetirizine (XYZAL) 5 MG tablet TAKE 1 TABLET(5 MG) BY MOUTH EVERY EVENING  . omeprazole (PRILOSEC) 40 MG capsule TAKE 1 CAPSULE(40 MG) BY MOUTH DAILY  . potassium chloride SA (K-DUR) 20 MEQ tablet Take 1 tablet (20 mEq total) by mouth daily.  . rosuvastatin (CRESTOR) 10 MG tablet Take 1 tablet (10 mg total) by mouth daily.  . solifenacin (VESICARE) 5 MG tablet Take 1 tablet (5 mg total) by mouth daily.  Marland Kitchen spironolactone (ALDACTONE) 25 MG tablet Take 1 tablet by mouth daily.  . Vitamin D, Cholecalciferol, 1000 UNITS TABS Take 1,000 Units by mouth daily.   Carlena Hurl 20 MG TABS tablet Take 20 mg by mouth at bedtime.  Marland Kitchen zolpidem  (AMBIEN) 5 MG tablet Take 1 tablet (5 mg total) by mouth at bedtime.   Facility-Administered Encounter Medications as of 09/15/2019  Medication  . cyanocobalamin ((VITAMIN B-12)) injection 1,000 mcg     Current Diagnosis/Assessment:  Goals Addressed   None    AFIB   Patient is currently rate controlled. HR 87 BPM  Patient has failed these meds in past: Pradaxa, Eliquis, warfarin Patient is currently controlled on the following medications: Xarelto  We discussed:   Stopped Xarelto Back to Pradaxa x 3 weeks (chills, numb tongue, lips swollen, some itching, heart burn), update medication list $150 for 90 days Has blister pack of Pradaxa Now doesn't take Pradaxa on an empty stomach  Savaysa would require renal dosing  Warfarin caused a lot of bruising  Plan  Counseled on limited options, such as Savaysa, if Pradaxa failed Continue current medications   Medication Management   Pt uses [facility] pharmacy for all medications Uses pill box? Yes Pt endorses 100% compliance  We discussed:  Xarelto last filled 4/29, not taking  Plan  Continue current medication management strategy  Follow up: 3 month phone visit  Felton Clinton, PharmD, BCGP, CTTS Clinical Pharmacist [facility] [phone]

## 2019-09-17 NOTE — Patient Instructions (Addendum)
Visit Information  Goals Addressed            This Visit's Progress   . COMPLETED: Heart Failure       CARE PLAN ENTRY (see longitudinal plan of care for additional care plan information)  Current Barriers:  . Controlled heart failure, complicated by diabetes, hypertension . Current heart failure regimen: amiodarone, Coreg, irbesartan, spironolactone . Previous heart failure medications tried: furosemide . Patient reads package insert very carefully and has failed Eliquis and Pradaxa due to rare adverse events listed in the patient information  Pharmacist Clinical Goal(s):  Marland Kitchen Over the next 90 days, patient will work with PharmD and providers to optimize heart failure regimen . Ensure that patient is comfortable with Xarelto and can afford it  Interventions: . Inter-disciplinary care team collaboration (see longitudinal plan of care) . Comprehensive medication review performed; medication list updated in the electronic medical record.  . Explained that adverse events are rare and that the benefit of an anticoagulant far outweighs these issues  Patient Self Care Activities:  . Patient will not focus on the rare adverse events of a life saving medication . Patient will focus on medication adherence by remaining on Xarelto and tolerating minor adverse events  Initial goal documentation        Print copy of patient instructions provided.   Telephone follow up appointment with pharmacy team member scheduled for:  Felton Clinton, PharmD, [place], [place] Clinical Pharmacist [facility] [phone]  Atrial Fibrillation  Atrial fibrillation is a type of heartbeat that is irregular or fast. If you have this condition, your heart beats without any order. This makes it hard for your heart to pump blood in a normal way. Atrial fibrillation may come and go, or it may become a long-lasting problem. If this condition is not treated, it can put you at higher risk for stroke,  heart failure, and other heart problems. What are the causes? This condition may be caused by diseases that damage the heart. They include:  High blood pressure.  Heart failure.  Heart valve disease.  Heart surgery. Other causes include:  Diabetes.  Thyroid disease.  Being overweight.  Kidney disease. Sometimes the cause is not known. What increases the risk? You are more likely to develop this condition if:  You are older.  You smoke.  You exercise often and very hard.  You have a family history of this condition.  You are a man.  You use drugs.  You drink a lot of alcohol.  You have lung conditions, such as emphysema, pneumonia, or COPD.  You have sleep apnea. What are the signs or symptoms? Common symptoms of this condition include:  A feeling that your heart is beating very fast.  Chest pain or discomfort.  Feeling short of breath.  Suddenly feeling light-headed or weak.  Getting tired easily during activity.  Fainting.  Sweating. In some cases, there are no symptoms. How is this treated? Treatment for this condition depends on underlying conditions and how you feel when you have atrial fibrillation. They include:  Medicines to: ? Prevent blood clots. ? Treat heart rate or heart rhythm problems.  Using devices, such as a pacemaker, to correct heart rhythm problems.  Doing surgery to remove the part of the heart that sends bad signals.  Closing an area where clots can form in the heart (left atrial appendage). In some cases, your doctor will treat other underlying conditions. Follow these instructions at home: Medicines  Take over-the-counter and  prescription medicines only as told by your doctor.  Do not take any new medicines without first talking to your doctor.  If you are taking blood thinners: ? Talk with your doctor before you take any medicines that have aspirin or NSAIDs, such as ibuprofen, in them. ? Take your medicine  exactly as told by your doctor. Take it at the same time each day. ? Avoid activities that could hurt or bruise you. Follow instructions about how to prevent falls. ? Wear a bracelet that says you are taking blood thinners. Or, carry a card that lists what medicines you take. Lifestyle      Do not use any products that have nicotine or tobacco in them. These include cigarettes, e-cigarettes, and chewing tobacco. If you need help quitting, ask your doctor.  Eat heart-healthy foods. Talk with your doctor about the right eating plan for you.  Exercise regularly as told by your doctor.  Do not drink alcohol.  Lose weight if you are overweight.  Do not use drugs, including cannabis. General instructions  If you have a condition that causes breathing to stop for a short period of time (apnea), treat it as told by your doctor.  Keep a healthy weight. Do not use diet pills unless your doctor says they are safe for you. Diet pills may make heart problems worse.  Keep all follow-up visits as told by your doctor. This is important. Contact a doctor if:  You notice a change in the speed, rhythm, or strength of your heartbeat.  You are taking a blood-thinning medicine and you get more bruising.  You get tired more easily when you move or exercise.  You have a sudden change in weight. Get help right away if:   You have pain in your chest or your belly (abdomen).  You have trouble breathing.  You have side effects of blood thinners, such as blood in your vomit, poop (stool), or pee (urine), or bleeding that cannot stop.  You have any signs of a stroke. "BE FAST" is an easy way to remember the main warning signs: ? B - Balance. Signs are dizziness, sudden trouble walking, or loss of balance. ? E - Eyes. Signs are trouble seeing or a change in how you see. ? F - Face. Signs are sudden weakness or loss of feeling in the face, or the face or eyelid drooping on one side. ? A - Arms. Signs  are weakness or loss of feeling in an arm. This happens suddenly and usually on one side of the body. ? S - Speech. Signs are sudden trouble speaking, slurred speech, or trouble understanding what people say. ? T - Time. Time to call emergency services. Write down what time symptoms started.  You have other signs of a stroke, such as: ? A sudden, very bad headache with no known cause. ? Feeling like you may vomit (nausea). ? Vomiting. ? A seizure. These symptoms may be an emergency. Do not wait to see if the symptoms will go away. Get medical help right away. Call your local emergency services (911 in the [place] Do not drive yourself to the hospital. Summary  Atrial fibrillation is a type of heartbeat that is irregular or fast.  You are at higher risk of this condition if you smoke, are older, have diabetes, or are overweight.  Follow your doctor's instructions about medicines, diet, exercise, and follow-up visits.  Get help right away if you have signs or symptoms of a stroke.  Get help right away if you cannot catch your breath, or you have chest pain or discomfort. This information is not intended to replace advice given to you by your health care provider. Make sure you discuss any questions you have with your health care provider. Document Revised: 09/22/2018 Document Reviewed: 09/22/2018 Elsevier Patient Education  [place].

## 2019-10-04 ENCOUNTER — Ambulatory Visit (INDEPENDENT_AMBULATORY_CARE_PROVIDER_SITE_OTHER): Payer: Medicare Other

## 2019-10-04 ENCOUNTER — Other Ambulatory Visit: Payer: Self-pay

## 2019-10-04 DIAGNOSIS — E538 Deficiency of other specified B group vitamins: Secondary | ICD-10-CM | POA: Diagnosis not present

## 2019-10-31 ENCOUNTER — Other Ambulatory Visit: Payer: Self-pay | Admitting: Family Medicine

## 2019-10-31 DIAGNOSIS — I480 Paroxysmal atrial fibrillation: Secondary | ICD-10-CM

## 2019-11-05 ENCOUNTER — Other Ambulatory Visit: Payer: Self-pay | Admitting: Family Medicine

## 2019-11-05 DIAGNOSIS — K219 Gastro-esophageal reflux disease without esophagitis: Secondary | ICD-10-CM

## 2019-11-05 NOTE — Telephone Encounter (Signed)
Requested Prescriptions  Pending Prescriptions Disp Refills  . omeprazole (PRILOSEC) 40 MG capsule [Pharmacy Med Name: OMEPRAZOLE 40MG  CAPSULES] 90 capsule 1    Sig: TAKE 1 CAPSULE(40 MG) BY MOUTH DAILY     Gastroenterology: Proton Pump Inhibitors Passed - 11/05/2019  2:25 PM      Passed - Valid encounter within last 12 months    Recent Outpatient Visits          2 months ago B12 deficiency   [facility], MD   6 months ago Dizziness   [facility] [place] [facility], MD   9 months ago Paroxysmal A-fib [facility])   [facility], MD   11 months ago Acute respiratory failure with hypoxia [facility])   [facility], FNP   1 year ago Thrombocytopenia [facility])   [facility], MD      Future Appointments            In 1 month Alba Cory, Carlynn Purl, MD [facility]   In 2 months  [facility]

## 2019-12-03 ENCOUNTER — Other Ambulatory Visit: Payer: Self-pay | Admitting: Family Medicine

## 2019-12-03 DIAGNOSIS — G4700 Insomnia, unspecified: Secondary | ICD-10-CM

## 2019-12-03 NOTE — Telephone Encounter (Signed)
Requested medication (s) are due for refill today: yes  Requested medication (s) are on the active medication list: yes  Last refill:  08/25/19  Future visit scheduled: yes  Notes to clinic:  med not delegated to NT to RF   Requested Prescriptions  Pending Prescriptions Disp Refills   zolpidem (AMBIEN) 5 MG tablet [Pharmacy Med Name: ZOLPIDEM 5MG  TABLETS] 90 tablet     Sig: TAKE 1 TABLET(5 MG) BY MOUTH AT BEDTIME AS NEEDED FOR SLEEP      Not Delegated - Psychiatry:  Anxiolytics/Hypnotics Failed - 12/03/2019  9:28 AM      Failed - This refill cannot be delegated      Failed - Urine Drug Screen completed in last 360 days.      Passed - Valid encounter within last 6 months    Recent Outpatient Visits           3 months ago B12 deficiency   [facility], MD   7 months ago Dizziness   [facility], MD   10 months ago Paroxysmal A-fib [facility])   [facility], MD   12 months ago Acute respiratory failure with hypoxia [facility])   [facility], FNP   1 year ago Thrombocytopenia [facility])   [facility], MD       Future Appointments             In 3 weeks Alba Cory, Carlynn Purl, MD [facility], [facility]   In 1 month  [facility]

## 2019-12-05 ENCOUNTER — Other Ambulatory Visit: Payer: Self-pay

## 2019-12-05 ENCOUNTER — Other Ambulatory Visit: Payer: Self-pay | Admitting: Family Medicine

## 2019-12-05 DIAGNOSIS — G4700 Insomnia, unspecified: Secondary | ICD-10-CM

## 2019-12-05 NOTE — Telephone Encounter (Signed)
Requested medication (s) are due for refill today: yes  Requested medication (s) are on the active medication list: yes  Last refill:  05/08/2019  Future visit scheduled: yes  Notes to clinic:  Duplicate request  This refill cannot be delegated or refuse by NT   Requested Prescriptions  Pending Prescriptions Disp Refills   zolpidem (AMBIEN) 5 MG tablet [Pharmacy Med Name: ZOLPIDEM 5MG  TABLETS] 90 tablet     Sig: TAKE 1 TABLET(5 MG) BY MOUTH AT BEDTIME AS NEEDED FOR SLEEP      Not Delegated - Psychiatry:  Anxiolytics/Hypnotics Failed - 12/05/2019  1:23 PM      Failed - This refill cannot be delegated      Failed - Urine Drug Screen completed in last 360 days.      Passed - Valid encounter within last 6 months    Recent Outpatient Visits           3 months ago B12 deficiency   [facility], MD   7 months ago Dizziness   [facility], MD   10 months ago Paroxysmal A-fib [facility])   [facility], MD   12 months ago Acute respiratory failure with hypoxia [facility])   [facility], FNP   1 year ago Thrombocytopenia [facility])   [facility], MD       Future Appointments             In 3 weeks Alba Cory, Carlynn Purl, MD [facility], [facility]   In 1 month  [facility], [facility]

## 2019-12-21 ENCOUNTER — Ambulatory Visit: Payer: Medicare Other | Admitting: Pharmacist

## 2019-12-21 ENCOUNTER — Other Ambulatory Visit: Payer: Self-pay

## 2019-12-21 DIAGNOSIS — I5032 Chronic diastolic (congestive) heart failure: Secondary | ICD-10-CM

## 2019-12-21 DIAGNOSIS — E785 Hyperlipidemia, unspecified: Secondary | ICD-10-CM

## 2019-12-21 NOTE — Chronic Care Management (AMB) (Signed)
Chronic Care Management Pharmacy  Name: Sharon Bullock  MRN: [id number] DOB: 1933/07/11   Chief Complaint/ HPI  Sharon Bullock,  86 y.o. , female presents for their Follow-Up CCM visit with the clinical pharmacist via telephone due to COVID-19 Pandemic.  PCP : Steele Sizer, MD  Their chronic conditions include: HTN, HLD, HF  Office Visits: NA  Consult Visit: NA  Medications: Outpatient Encounter Medications as of 12/21/2019  Medication Sig  . dabigatran (PRADAXA) 150 MG CAPS capsule Take 150 mg by mouth 2 (two) times daily.  Marland Kitchen amiodarone (PACERONE) 200 MG tablet Take 1 tablet by mouth daily.  Marland Kitchen amLODipine (NORVASC) 10 MG tablet Take 1 tablet (10 mg total) by mouth daily.  . calcium-vitamin D (OSCAL WITH D) 500-200 MG-UNIT tablet Take 1 tablet by mouth 2 (two) times daily.  . carvedilol (COREG) 25 MG tablet TAKE 1 TABLET(25 MG) BY MOUTH TWICE DAILY WITH A MEAL  . co-enzyme Q-10 30 MG capsule Take 30 mg by mouth daily.   . diclofenac Sodium (VOLTAREN) 1 % GEL Wrist twice daily  . ferrous gluconate (FERGON) 324 MG tablet Take 324 mg by mouth daily with breakfast.  . folic acid (FOLVITE) 562 MCG tablet Take 400 mcg by mouth daily.   . irbesartan (AVAPRO) 300 MG tablet Take 1 tablet (300 mg total) by mouth daily.  Marland Kitchen levocetirizine (XYZAL) 5 MG tablet TAKE 1 TABLET(5 MG) BY MOUTH EVERY EVENING  . omeprazole (PRILOSEC) 40 MG capsule TAKE 1 CAPSULE(40 MG) BY MOUTH DAILY  . potassium chloride SA (K-DUR) 20 MEQ tablet Take 1 tablet (20 mEq total) by mouth daily.  . rosuvastatin (CRESTOR) 10 MG tablet Take 1 tablet (10 mg total) by mouth daily.  . solifenacin (VESICARE) 5 MG tablet Take 1 tablet (5 mg total) by mouth daily.  Marland Kitchen spironolactone (ALDACTONE) 25 MG tablet Take 1 tablet by mouth daily.  . Vitamin D, Cholecalciferol, 1000 UNITS TABS Take 1,000 Units by mouth daily.   Alveda Reasons 20 MG TABS tablet Take 20 mg by mouth at bedtime. (Patient not taking: Reported on 12/24/2019)  . zolpidem  (AMBIEN) 5 MG tablet TAKE 1 TABLET(5 MG) BY MOUTH AT BEDTIME AS NEEDED FOR SLEEP   Facility-Administered Encounter Medications as of 12/21/2019  Medication  . cyanocobalamin ((VITAMIN B-12)) injection 1,000 mcg      Financial Resource Strain: High Risk  . Difficulty of Paying Living Expenses: Very hard    Current Diagnosis/Assessment:  Goals Addressed            This Visit's Progress   . Heart Failure       CARE PLAN ENTRY (see longitudinal plan of care for additional care plan information)  Current Barriers:  . Controlled heart failure, complicated by diabetes, hypertension . Current heart failure regimen: amiodarone, Coreg, irbesartan, spironolactone . Previous heart failure medications tried: furosemide . Patient reads package insert very carefully and has failed Eliquis and Xarelto due to rare adverse events listed in the patient information  Pharmacist Clinical Goal(s):  Marland Kitchen Over the next 90 days, patient will work with PharmD and providers to optimize heart failure regimen . Ensure that patient is comfortable with Pradaxa and can afford it  Interventions: . Inter-disciplinary care team collaboration (see longitudinal plan of care) . Comprehensive medication review performed; medication list updated in the electronic medical record.  . Explained that adverse events are rare and that the benefit of an anticoagulant far outweighs these issues  Patient Self Care Activities:  . Patient will  not focus on the rare adverse events of a life saving medication . Patient will focus on medication adherence by remaining on Pradaxa and tolerating minor adverse events  Initial goal documentation       Heart Failure   Type: Diastolic  Last ejection fraction: NA NYHA Class: I (no actitivty limitation) AHA HF Stage: B (Heart disease present - no symptoms present)  Patient has failed these meds in past: warfarin, Eliquis, Xarelto Patient is currently controlled on the following  medications: Pradaxa  We discussed: Issues with previous medications including blurry vision No blurry eyes with Pradaxa  Plan  Continue current medications  Hyperlipidemia   LDL goal < 100  Lipid Panel     Component Value Date/Time   CHOL 151 05/06/2019 1225   CHOL 192 08/17/2015 1115   TRIG 48 05/06/2019 1225   HDL 65 05/06/2019 1225   HDL 96 08/17/2015 1115   LDLCALC 72 05/06/2019 1225    Hepatic Function Latest Ref Rng & Units 05/06/2019 01/25/2019 11/30/2018  Total Protein 6.1 - 8.1 g/dL 6.6 7.0 7.4  Albumin 3.5 - 5.0 g/dL - - 3.7  AST 10 - 35 U/L _0 ALT 6 - 29 U/L _1 Alk Phosphatase 38 - 126 U/L - - 82  Total Bilirubin 0.2 - 1.2 mg/dL 0.5 0.4 0.7     The ASCVD Risk score Mikey Bussing DC Jr., et al., 2013) failed to calculate for the following reasons:   The 2013 ASCVD risk score is only valid for ages 46 to 34   Patient has failed these meds in past: NA Patient is currently controlled on the following medications:  . Crestor 40m daily  We discussed:   At goal Denies myalgias  Plan  Continue current medications  Medication Management   [place] Uses pill box? Yes Pt endorses 100% compliance  We discussed:  Pradaxa or Xarelto? Pradaxa  No blurry eyes with Pradaxa Takes Pradaxa with meals and drinks enough  No new supplements or meds  Plan  Continue current medication management strategy  Follow up: 3 month phone visit  TMilus Height PharmD, [phone]

## 2019-12-24 NOTE — Patient Instructions (Addendum)
Visit Information  Goals Addressed            This Visit's Progress   . Heart Failure       CARE PLAN ENTRY (see longitudinal plan of care for additional care plan information)  Current Barriers:  . Controlled heart failure, complicated by diabetes, hypertension . Current heart failure regimen: amiodarone, Coreg, irbesartan, spironolactone . Previous heart failure medications tried: furosemide . Patient reads package insert very carefully and has failed Eliquis and Xarelto due to rare adverse events listed in the patient information  Pharmacist Clinical Goal(s):  Marland Kitchen Over the next 90 days, patient will work with PharmD and providers to optimize heart failure regimen . Ensure that patient is comfortable with Pradaxa and can afford it  Interventions: . Inter-disciplinary care team collaboration (see longitudinal plan of care) . Comprehensive medication review performed; medication list updated in the electronic medical record.  . Explained that adverse events are rare and that the benefit of an anticoagulant far outweighs these issues  Patient Self Care Activities:  . Patient will not focus on the rare adverse events of a life saving medication . Patient will focus on medication adherence by remaining on Pradaxa and tolerating minor adverse events  Initial goal documentation        Print copy of patient instructions provided.   Telephone follow up appointment with pharmacy team member scheduled for: 3 months  Felton Clinton, PharmD, [place], CTTS Clinical Pharmacist [facility] [phone]  Heart Failure, Self Care Heart failure is a serious condition. This sheet explains things you need to do to take care of yourself at home. To help you stay as healthy as possible, you may be asked to change your diet, take certain medicines, and make other changes in your life. Your doctor may also give you more specific instructions. If you have problems or questions, call your  doctor. What are the risks? Having heart failure makes it more likely for you to have some problems. These problems can get worse if you do not take good care of yourself. Problems may include:  Blood clotting problems. This may cause a stroke.  Damage to the kidneys, liver, or lungs.  Abnormal heart rhythms. Supplies needed:  Scale for weighing yourself.  Blood pressure monitor.  Notebook.  Medicines. How to care for yourself when you have heart failure Medicines Take over-the-counter and prescription medicines only as told by your doctor. Take your medicines every day.  Do not stop taking your medicine unless your doctor tells you to do so.  Do not skip any medicines.  Get your prescriptions refilled before you run out of medicine. This is important. Eating and drinking   Eat heart-healthy foods. Talk with a diet specialist (dietitian) to create an eating plan.  Choose foods that: ? Have no trans fat. ? Are low in saturated fat and cholesterol.  Choose healthy foods, such as: ? Fresh or frozen fruits and vegetables. ? Fish. ? Low-fat (lean) meats. ? Legumes, such as beans, peas, and lentils. ? Fat-free or low-fat dairy products. ? Whole-grain foods. ? High-fiber foods.  Limit salt (sodium) if told by your doctor. Ask your diet specialist to tell you which seasonings are healthy for your heart.  Cook in healthy ways instead of frying. Healthy ways of cooking include roasting, grilling, broiling, baking, poaching, steaming, and stir-frying.  Limit how much fluid you drink, if told by your doctor. Alcohol use  Do not drink alcohol if: ? Your doctor tells you  not to drink. ? Your heart was damaged by alcohol, or you have very bad heart failure. ? You are pregnant, may be pregnant, or are planning to become pregnant.  If you drink alcohol: ? Limit how much you use to:  0-1 drink a day for women.  0-2 drinks a day for men. ? Be aware of how much alcohol is  in your drink. In the [place] one drink equals one 12 oz bottle of beer (355 mL), one 5 oz glass of wine (148 mL), or one 1 oz glass of hard liquor (44 mL). Lifestyle   Do not use any products that contain nicotine or tobacco, such as cigarettes, e-cigarettes, and chewing tobacco. If you need help quitting, ask your doctor. ? Do not use nicotine gum or patches before talking to your doctor.  Do not use illegal drugs.  Lose weight if told by your doctor.  Do physical activity if told by your doctor. Talk to your doctor before you begin an exercise if: ? You are an older adult. ? You have very bad heart failure.  Learn to manage stress. If you need help, ask your doctor.  Get rehab (rehabilitation) to help you stay independent and to help with your quality of life.  Plan time to rest when you get tired. Check weight and blood pressure   Weigh yourself every day. This will help you to know if fluid is building up in your body. ? Weigh yourself every morning after you pee (urinate) and before you eat breakfast. ? Wear the same amount of clothing each time. ? Write down your daily weight. Give your record to your doctor.  Check and write down your blood pressure as told by your doctor.  Check your pulse as told by your doctor. Dealing with very hot and very cold weather  If it is very hot: ? Avoid activities that take a lot of energy. ? Use air conditioning or fans, or find a cooler place. ? Avoid caffeine and alcohol. ? Wear clothing that is loose-fitting, lightweight, and light-colored.  If it is very cold: ? Avoid activities that take a lot of energy. ? Layer your clothes. ? Wear mittens or gloves, a hat, and a scarf when you go outside. ? Avoid alcohol. Follow these instructions at home:  Stay up to date with shots (vaccines). Get pneumococcal and flu (influenza) shots.  Keep all follow-up visits as told by your doctor. This is important. Contact a doctor if:  You  gain weight quickly.  You have increasing shortness of breath.  You cannot do your normal activities.  You get tired easily.  You cough a lot.  You don't feel like eating or feel like you may vomit (nauseous).  You become puffy (swell) in your hands, feet, ankles, or belly (abdomen).  You cannot sleep well because it is hard to breathe.  You feel like your heart is beating fast (palpitations).  You get dizzy when you stand up. Get help right away if:  You have trouble breathing.  You or someone else notices a change in your behavior, such as having trouble staying awake.  You have chest pain or discomfort.  You pass out (faint). These symptoms may be an emergency. Do not wait to see if the symptoms will go away. Get medical help right away. Call your local emergency services (911 in the [place] Do not drive yourself to the hospital. Summary  Heart failure is a serious condition. To care for  yourself, you may have to change your diet, take medicines, and make other lifestyle changes.  Take your medicines every day. Do not stop taking them unless your doctor tells you to do so.  Eat heart-healthy foods, such as fresh or frozen fruits and vegetables, fish, lean meats, legumes, fat-free or low-fat dairy products, and whole-grain or high-fiber foods.  Ask your doctor if you can drink alcohol. You may have to stop alcohol use if you have very bad heart failure.  Contact your doctor if you gain weight quickly or feel that your heart is beating too fast. Get help right away if you pass out, or have chest pain or trouble breathing. This information is not intended to replace advice given to you by your health care provider. Make sure you discuss any questions you have with your health care provider. Document Revised: 07/13/2018 Document Reviewed: 07/14/2018 Elsevier Patient Education  2020 [employer].

## 2019-12-26 ENCOUNTER — Encounter: Payer: Self-pay | Admitting: Family Medicine

## 2019-12-26 ENCOUNTER — Other Ambulatory Visit: Payer: Self-pay

## 2019-12-26 ENCOUNTER — Ambulatory Visit (INDEPENDENT_AMBULATORY_CARE_PROVIDER_SITE_OTHER): Payer: Medicare Other | Admitting: Family Medicine

## 2019-12-26 VITALS — BP 140/62 | HR 57 | Temp 98.1°F | Resp 16 | Ht 65.0 in | Wt 146.8 lb

## 2019-12-26 DIAGNOSIS — D692 Other nonthrombocytopenic purpura: Secondary | ICD-10-CM

## 2019-12-26 DIAGNOSIS — I10 Essential (primary) hypertension: Secondary | ICD-10-CM

## 2019-12-26 DIAGNOSIS — I480 Paroxysmal atrial fibrillation: Secondary | ICD-10-CM | POA: Diagnosis not present

## 2019-12-26 DIAGNOSIS — G4700 Insomnia, unspecified: Secondary | ICD-10-CM | POA: Diagnosis not present

## 2019-12-26 DIAGNOSIS — M5416 Radiculopathy, lumbar region: Secondary | ICD-10-CM

## 2019-12-26 DIAGNOSIS — K551 Chronic vascular disorders of intestine: Secondary | ICD-10-CM | POA: Diagnosis not present

## 2019-12-26 DIAGNOSIS — F341 Dysthymic disorder: Secondary | ICD-10-CM

## 2019-12-26 DIAGNOSIS — J3089 Other allergic rhinitis: Secondary | ICD-10-CM

## 2019-12-26 DIAGNOSIS — N1831 Chronic kidney disease, stage 3a: Secondary | ICD-10-CM

## 2019-12-26 DIAGNOSIS — Z23 Encounter for immunization: Secondary | ICD-10-CM

## 2019-12-26 DIAGNOSIS — D696 Thrombocytopenia, unspecified: Secondary | ICD-10-CM

## 2019-12-26 DIAGNOSIS — E785 Hyperlipidemia, unspecified: Secondary | ICD-10-CM

## 2019-12-26 DIAGNOSIS — E538 Deficiency of other specified B group vitamins: Secondary | ICD-10-CM

## 2019-12-26 DIAGNOSIS — Z79899 Other long term (current) drug therapy: Secondary | ICD-10-CM

## 2019-12-26 DIAGNOSIS — D638 Anemia in other chronic diseases classified elsewhere: Secondary | ICD-10-CM

## 2019-12-26 DIAGNOSIS — K219 Gastro-esophageal reflux disease without esophagitis: Secondary | ICD-10-CM

## 2019-12-26 DIAGNOSIS — I5032 Chronic diastolic (congestive) heart failure: Secondary | ICD-10-CM | POA: Diagnosis not present

## 2019-12-26 DIAGNOSIS — M2040 Other hammer toe(s) (acquired), unspecified foot: Secondary | ICD-10-CM

## 2019-12-26 MED ORDER — AMLODIPINE BESYLATE 10 MG PO TABS: 10.0000 mg | ORAL_TABLET | Freq: Every day | ORAL | 1 refills | Status: DC

## 2019-12-26 MED ORDER — ZOLPIDEM TARTRATE 5 MG PO TABS: 5.0000 mg | ORAL_TABLET | Freq: Every day | ORAL | 0 refills | Status: DC

## 2019-12-26 MED ORDER — ROSUVASTATIN CALCIUM 10 MG PO TABS
10.0000 mg | ORAL_TABLET | Freq: Every day | ORAL | 1 refills | Status: AC
Start: 2019-12-26 — End: ?

## 2019-12-26 NOTE — Progress Notes (Signed)
Name: Sharon Bullock   MRN: [id number]    DOB: [birth year]   Date:12/26/2019       Progress Note  Subjective  Chief Complaint  Chief Complaint  Patient presents with  . Hypertension  . Atrial Fibrillation  . Dyslipidemia  . Depression  . Insomnia    HPI  HTN:bp is at goal today for her today , she denies chest pain , she occasionally has  palpitation, she has some sob with activity.   History of lumbar spondylosis with radiculitis: she was seen at [facility] 03/2018 and left  side symptoms resolved, however she had to stop therapy because of pandemic, she has noticed pain from right groin down to lateral part of right leg and foot, she has been using a cane because she is afraid she will fall, she has been leaning forward and also feels like her leg will give out. She cannot go back to [facility] because of transportation but would like to see someone else locally.   Carotid atherosclerosis: found during MRI head done at [facility] at [facility] for evaluation of vertigo in 12/2015. On medical management at this time. She also has mesenteric insufficiency, she denies abdominal pain or blood in stools.Taking Crestorand was on Xarelto but had some chills and blurred vision, she is now on Pradaxa and denies side effects   Afib/CHF: she sees a cardiologist - Dr. Regino Schultze at [facility]. She has been on Pradaxa but recently changed to Xarelto but decided to go back on Pradaxa states heartburn not as severe since she has been taking it with food and drinking more water . Last CHF admission 11/2018  She has been tired, she has chronic orthopnea, she has mild swelling of lower legs but stable.   INTERPRETATION 04/29 2021 at [facility]   NORMAL LEFT VENTRICULAR SYSTOLIC FUNCTION WITH MODERATE LVH  NORMAL RIGHT VENTRICULAR SYSTOLIC FUNCTION  VALVULAR REGURGITATION: MILD AR, MODERATE MR, MILD PR, MODERATE TR  NO VALVULAR STENOSIS  TRIVIAL PERICARDIAL EFFUSION  Insomnia: She tried Trazodone without help, only able to  sleep one hour per night,she is on Ambien 10 mg, tried Ambien 5 mg without much help, but insurance stopped paying for it, so she is back on 5 mg. Diuretics causes nocturia. No changes since last visit   Hypertrophic cardiomyopathy and hypertensive pulmonary vascular disease: she sees Dr. Regino Schultze, no chest pain, she has sob with activity , she denies paroxysmal nocturnal dyspnea, the bilateral swellingon legs is stable, better in am and worse at night.She is on ARB, beta-blocker and Pradaxa   Dyslipidemia: she is on Crestor and CoQ 10, denies side effects of medication, advised to always bring all medications to all doctor visits .  MDD: severe episode last year that lasted months,shestates she is in remission.   Perennial allergic rhinitis: she has post-nasal drainage , usually clear to cloudy in color and sometimes it makes her feel sob. She is taking Xyzal.  CKI stage III: her kidney function has dropped a little, normal urine output, no pruritis   Toe deformity: seen by podiatrist in the past , states getting worse, difficulty buying shoes, also having some tingling  Thrombocytopenia: she is willing to have labs done today. Denies easy bleeding , she has easy bruising on her arms but stable. Discussed senile purpura   Patient Active Problem List   Diagnosis Date Noted  . Pericardial effusion 04/18/2019  . Moderate major depression, single episode (HCC) 08/03/2018  . Thrombocytopenia (HCC) 05/27/2016  . CHF NYHA class  I, chronic, diastolic (HCC) 05/26/2016  . Mesenteric vascular insufficiency (HCC) 02/28/2016  . Pancreas cyst 02/28/2016  . Increased endometrial stripe thickness 02/28/2016  . Carotid atherosclerosis, bilateral 01/23/2016  . Hearing loss 08/17/2015  . Mitral regurgitation 04/19/2015  . Pernicious anemia 09/27/2014  . Paroxysmal A-fib (HCC) 09/27/2014  . Benign essential HTN 09/27/2014  . Perennial allergic rhinitis 09/27/2014  . Chronic anemia 09/27/2014   . Insomnia, persistent 09/27/2014  . Hypertensive pulmonary vascular disease (HCC) 09/27/2014  . B12 deficiency 09/27/2014  . Diverticulosis of colon 09/27/2014  . Dyslipidemia 09/27/2014  . Edema extremities 09/27/2014  . Gastric reflux 09/27/2014  . Bilateral hearing loss 09/27/2014  . Polypharmacy 09/27/2014  . Pelvic pain in female 09/27/2014  . MI (mitral incompetence) 09/27/2014  . Internal hemorrhoids 09/27/2014  . Osteopenia 09/27/2014  . Arthralgia of shoulder 09/27/2014  . Finger tendinitis 09/27/2014  . Urge incontinence 09/27/2014  . Cervical prolapse 09/27/2014  . Vertigo 09/27/2014  . Vitamin D deficiency 09/27/2014  . Bladder cystocele 04/15/2012  . Asymmetric septal hypertrophy (HCC) 05/19/2011  . Hypertrophic cardiomyopathy (HCC) 05/19/2011    Past Surgical History:  Procedure Laterality Date  . BLADDER REPAIR  2014   tact    Family History  Problem Relation Age of Onset  . Alzheimer's disease Mother   . Hypertension Mother   . Stroke Father   . Heart disease Father   . Heart disease Brother   . Hypertension Brother   . Cancer Daughter        breast  . Hypertension Daughter   . Breast cancer Daughter   . Diabetes Brother   . Heart disease Brother   . Heart disease Brother   . Diabetes Brother   . Hypertension Brother   . Heart disease Brother   . Hypertension Brother   . Cancer Brother        prostate  . Diabetes Brother   . Heart disease Brother   . Hypertension Brother   . Cancer Brother        prostate    Social History   Tobacco Use  . Smoking status: Never Smoker  . Smokeless tobacco: Never Used  . Tobacco comment: smoking cessation materials not required  Substance Use Topics  . Alcohol use: No    Alcohol/week: 0.0 standard drinks     Current Outpatient Medications:  .  amiodarone (PACERONE) 200 MG tablet, Take 1 tablet by mouth daily., Disp: , Rfl:  .  amLODipine (NORVASC) 10 MG tablet, Take 1 tablet (10 mg total) by  mouth daily., Disp: 90 tablet, Rfl: 1 .  carvedilol (COREG) 25 MG tablet, TAKE 1 TABLET(25 MG) BY MOUTH TWICE DAILY WITH A MEAL, Disp: 180 tablet, Rfl: 1 .  dabigatran (PRADAXA) 150 MG CAPS capsule, Take 150 mg by mouth 2 (two) times daily., Disp: , Rfl:  .  ferrous gluconate (FERGON) 324 MG tablet, Take 324 mg by mouth daily with breakfast., Disp: , Rfl:  .  folic acid (FOLVITE) 400 MCG tablet, Take 400 mcg by mouth daily. , Disp: , Rfl:  .  irbesartan (AVAPRO) 300 MG tablet, Take 1 tablet (300 mg total) by mouth daily., Disp: 90 tablet, Rfl: 1 .  omeprazole (PRILOSEC) 40 MG capsule, TAKE 1 CAPSULE(40 MG) BY MOUTH DAILY, Disp: 90 capsule, Rfl: 0 .  rosuvastatin (CRESTOR) 10 MG tablet, Take 1 tablet (10 mg total) by mouth daily., Disp: 90 tablet, Rfl: 1 .  solifenacin (VESICARE) 5 MG tablet, Take 1 tablet (5  mg total) by mouth daily., Disp: 30 tablet, Rfl: 2 .  spironolactone (ALDACTONE) 25 MG tablet, Take 1 tablet by mouth daily., Disp: , Rfl:  .  Vitamin D, Cholecalciferol, 1000 UNITS TABS, Take 1,000 Units by mouth daily. , Disp: , Rfl:  .  zolpidem (AMBIEN) 5 MG tablet, Take 1 tablet (5 mg total) by mouth at bedtime., Disp: 90 tablet, Rfl: 0  Current Facility-Administered Medications:  .  cyanocobalamin ((VITAMIN B-12)) injection 1,000 mcg, 1,000 mcg, Intramuscular, Q30 days, Alba Cory, MD, 1,000 mcg at 12/26/19 1100  Allergies  Allergen Reactions  . Nickel Dermatitis and Swelling  . Other Anaphylaxis    Uncoded Allergy. Allergen: SHELLFISH  . Ace Inhibitors Cough  . Xarelto [Rivaroxaban]     Per patient blurred     I personally reviewed active problem list, medication list, allergies, family history, social history, health maintenance with the patient/caregiver today.   ROS  Constitutional: Negative for fever or weight change.  Respiratory: Negative for cough and shortness of breath.   Cardiovascular: Negative for chest pain, sometimes has  palpitations.   Gastrointestinal: Negative for abdominal pain, no bowel changes.  Musculoskeletal: Positive  for gait problem and intermittent joint swelling.  Skin: Negative for rash.  Neurological: positive  For intermittent dizziness but no  headache.  No other specific complaints in a complete review of systems (except as listed in HPI above).   Objective  Vitals:   12/26/19 1045  BP: 140/62  Pulse: (!) 57  Resp: 16  Temp: 98.1 F (36.7 C)  TempSrc: Oral  SpO2: 99%  Weight: 146 lb 12.8 oz (66.6 kg)  Height: 5\' 5"  (1.651 m)    Body mass index is 24.43 kg/m.  Physical Exam  Constitutional: Patient appears well-developed and well-nourished. No distress.  HEENT: head atraumatic, normocephalic, pupils equal and reactive to light, , neck supple Cardiovascular: Normal rate, regular rhythm and normal heart sounds.  No murmur heard. 2 plus foot  BLE edema. Pulmonary/Chest: Effort normal and breath sounds normal. No respiratory distress. Abdominal: Soft.  There is no tenderness. Muscular skeletal: using a cane today, crepitus with extension of right knee but normal hip exam. Second toe right foot overlapping the first toe, also has some corn formation on top of right 2nd toe  Psychiatric: Patient has a normal mood and affect. behavior is normal. Judgment and thought content normal.  PHQ2/9: Depression screen [facility] 2/9 12/26/2019 08/25/2019 05/06/2019 02/04/2019 01/25/2019  Decreased Interest 2 0 0 0 0  Down, Depressed, Hopeless - 0 0 0 0  PHQ - 2 Score 2 0 0 0 0  Altered sleeping 2 0 0 0 -  Tired, decreased energy 3 0 0 0 -  Change in appetite 3 0 0 0 -  Feeling bad or failure about yourself  - 0 0 0 -  Trouble concentrating 0 0 0 0 -  Moving slowly or fidgety/restless 2 0 0 0 -  Suicidal thoughts 0 0 0 0 -  PHQ-9 Score 12 0 0 0 -  Difficult doing work/chores Somewhat difficult - - Not difficult at all -  Some recent data might be hidden     phq 9 is positive   Fall Risk: Fall Risk   12/26/2019 08/25/2019 05/06/2019 02/04/2019 01/25/2019  Falls in the past year? 1 0 1 0 0  Number falls in past yr: 1 0 0 0 0  Injury with Fall? 0 0 1 0 0  Risk for fall due to : - -  History of fall(s) - -  Risk for fall due to: Comment - - - - -  Follow up - - Falls evaluation completed - Falls prevention discussed     Functional Status Survey: Is the patient deaf or have difficulty hearing?: Yes Does the patient have difficulty seeing, even when wearing glasses/contacts?: No Does the patient have difficulty concentrating, remembering, or making decisions?: No Does the patient have difficulty walking or climbing stairs?: Yes Does the patient have difficulty dressing or bathing?: No Does the patient have difficulty doing errands alone such as visiting a doctor's office or shopping?: No    Assessment & Plan  1. Need for immunization against influenza  - Flu Vaccine QUAD High Dose(Fluad)  2. CHF NYHA class I, chronic, diastolic (HCC)  Keep follow up with Dr. Regino Schultze  3. Paroxysmal A-fib (HCC)  Rate controlled   4. Dyslipidemia  - rosuvastatin (CRESTOR) 10 MG tablet; Take 1 tablet (10 mg total) by mouth daily.  Dispense: 90 tablet; Refill: 1  5. Polypharmacy   6. B12 deficiency  Continue supplementation  Having some burning on foot we will recheck level   7. Benign essential HTN  - amLODipine (NORVASC) 10 MG tablet; Take 1 tablet (10 mg total) by mouth daily.  Dispense: 90 tablet; Refill: 1  8. Anemia of chronic disease  Recheck level   9. Insomnia, persistent  - zolpidem (AMBIEN) 5 MG tablet; Take 1 tablet (5 mg total) by mouth at bedtime.  Dispense: 90 tablet; Refill: 0  10. Senile purpura (HCC)  stable  11. Stage 3a chronic kidney disease   12. GERD without esophagitis  Controlled with medication   13. Mesenteric vascular insufficiency (HCC)  On pradaxa   14. Perennial allergic rhinitis   15. Right lumbar radiculitis  - Ambulatory referral to  Orthopedic Surgery  16. Acquired hammer toe  - Ambulatory referral to Podiatry  17. Thrombocytopenia (HCC)  - CBC with Differential/Platelet   18. Dysthymia  She does not want medication at this time

## 2019-12-27 LAB — LIPID PANEL
Cholesterol: 192 mg/dL (ref <200)
HDL: 76 mg/dL (ref >=50)
LDL Cholesterol (Calc): 103 mg/dL (calc) — ABNORMAL HIGH
Non-HDL Cholesterol (Calc): 116 mg/dL (calc) (ref <130)
Total CHOL/HDL Ratio: 2.5 (calc) (ref <5.0)
Triglycerides: 51 mg/dL (ref <150)

## 2019-12-27 LAB — CBC WITH DIFFERENTIAL/PLATELET
Absolute Monocytes: 489 cells/uL (ref 200–950)
Basophils Absolute: 21 cells/uL (ref 0–200)
Basophils Relative: 0.4 %
Eosinophils Absolute: 57 cells/uL (ref 15–500)
Eosinophils Relative: 1.1 %
HCT: 34 % — ABNORMAL LOW (ref 35.0–45.0)
Hemoglobin: 10.9 g/dL — ABNORMAL LOW (ref 11.7–15.5)
Lymphs Abs: 1274 cells/uL (ref 850–3900)
MCH: 30.2 pg (ref 27.0–33.0)
MCHC: 32.1 g/dL (ref 32.0–36.0)
MCV: 94.2 fL (ref 80.0–100.0)
MPV: 11.3 fL (ref 7.5–12.5)
Monocytes Relative: 9.4 %
Neutro Abs: 3359 cells/uL (ref 1500–7800)
Neutrophils Relative %: 64.6 %
Platelets: 128 10*3/uL — ABNORMAL LOW (ref 140–400)
RBC: 3.61 10*6/uL — ABNORMAL LOW (ref 3.80–5.10)
RDW: 13 % (ref 11.0–15.0)
Total Lymphocyte: 24.5 %
WBC: 5.2 10*3/uL (ref 3.8–10.8)

## 2019-12-27 LAB — COMPLETE METABOLIC PANEL WITH GFR
AG Ratio: 1.1 (calc) (ref 1.0–2.5)
ALT: 15 U/L (ref 6–29)
AST: 22 U/L (ref 10–35)
Albumin: 3.9 g/dL (ref 3.6–5.1)
Alkaline phosphatase (APISO): 72 U/L (ref 37–153)
BUN/Creatinine Ratio: 22 (calc) (ref 6–22)
BUN: 23 mg/dL (ref 7–25)
CO2: 27 mmol/L (ref 20–32)
Calcium: 9.1 mg/dL (ref 8.6–10.4)
Chloride: 106 mmol/L (ref 98–110)
Creat: 1.05 mg/dL — ABNORMAL HIGH (ref 0.60–0.88)
GFR, Est African American: 56 mL/min/{1.73_m2} — ABNORMAL LOW (ref >=60)
GFR, Est Non African American: 49 mL/min/{1.73_m2} — ABNORMAL LOW (ref >=60)
Globulin: 3.6 g/dL (calc) (ref 1.9–3.7)
Glucose, Bld: 91 mg/dL (ref 65–99)
Potassium: 4.2 mmol/L (ref 3.5–5.3)
Sodium: 139 mmol/L (ref 135–146)
Total Bilirubin: 0.4 mg/dL (ref 0.2–1.2)
Total Protein: 7.5 g/dL (ref 6.1–8.1)

## 2019-12-27 LAB — VITAMIN B12: Vitamin B-12: >2000 pg/mL — ABNORMAL HIGH (ref 200–1100)

## 2020-01-31 ENCOUNTER — Ambulatory Visit: Payer: Medicare Other

## 2020-02-03 ENCOUNTER — Other Ambulatory Visit: Payer: Self-pay | Admitting: Family Medicine

## 2020-02-03 DIAGNOSIS — I10 Essential (primary) hypertension: Secondary | ICD-10-CM

## 2020-02-06 IMAGING — DX PORTABLE CHEST - 1 VIEW
1 series · 1 of 1 positions shown · non-contrast
Comparison: May 26, 2016

CLINICAL DATA: Shortness of breath

EXAM:
PORTABLE CHEST 1 VIEW

[chest ap]
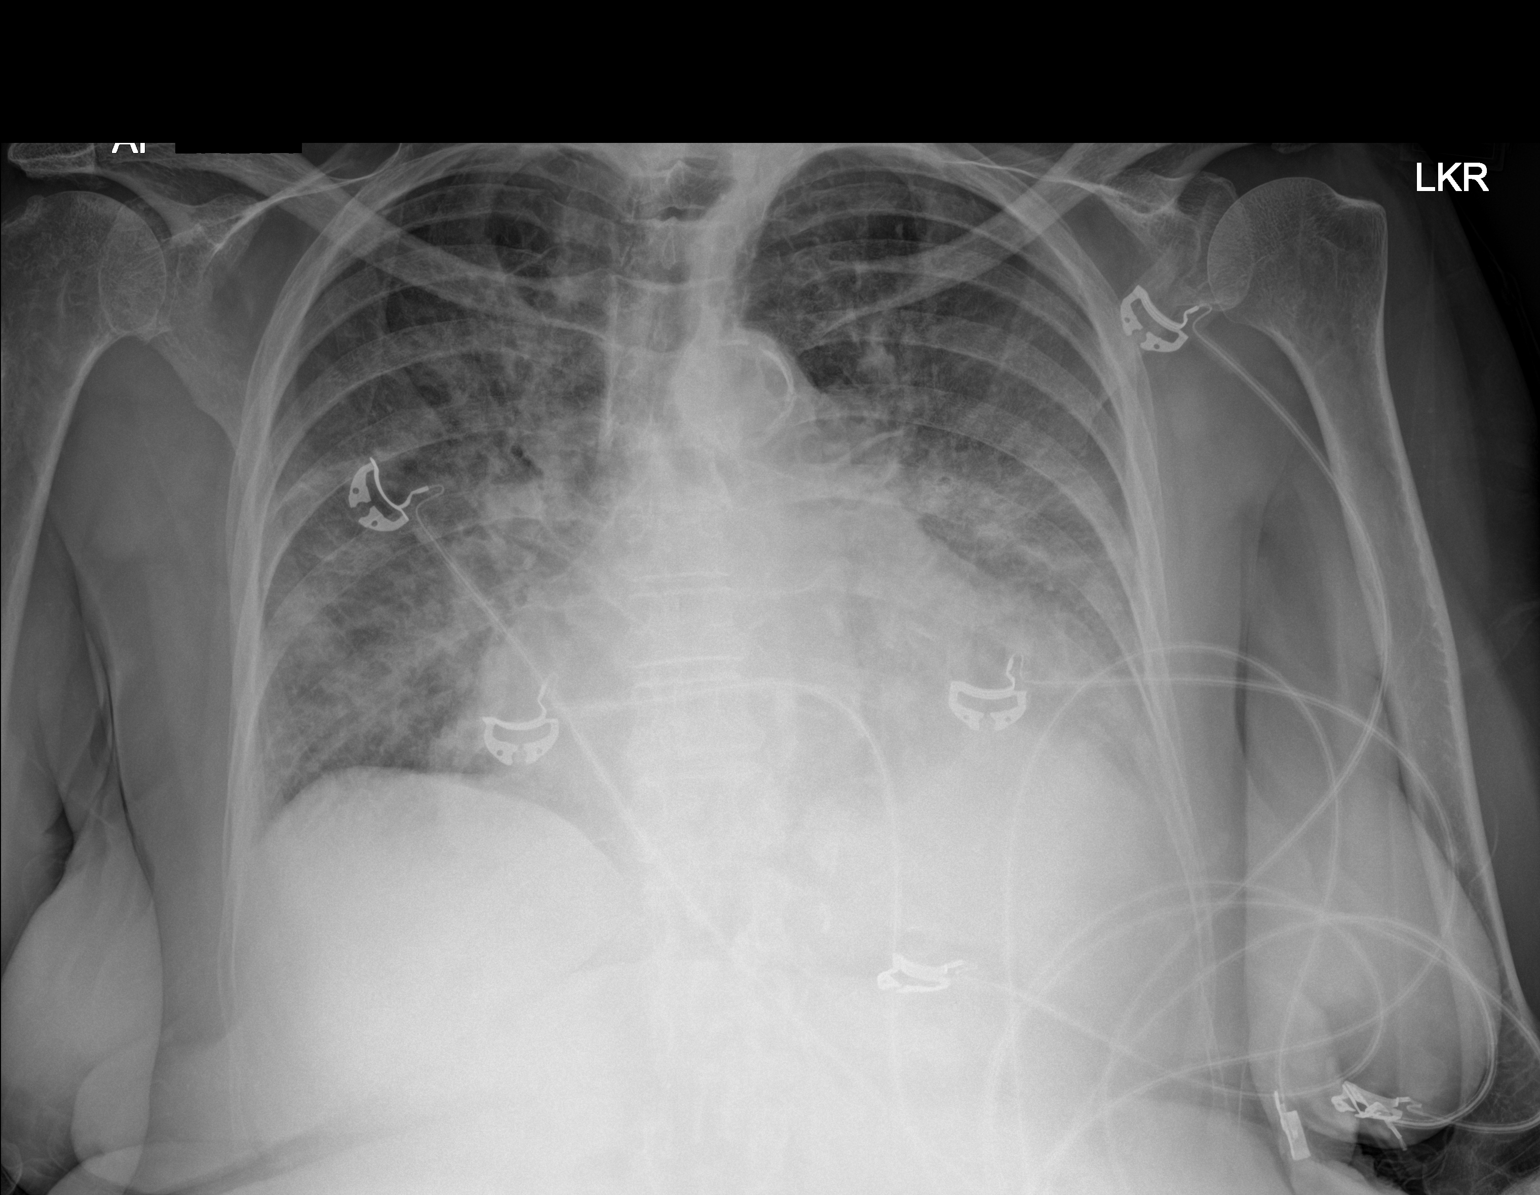

[1 of 1 positions shown; findings below may reference images not displayed]

FINDINGS: There is cardiomegaly with pulmonary venous hypertension. There is
interstitial edema. There is mild patchy alveolar opacity in the mid
and lower lung zones as well. No frank consolidation. There is
aortic atherosclerosis. No adenopathy. No bone lesions.
IMPRESSION: Pulmonary vascular congestion with interstitial and patchy alveolar
edema. These are findings felt to be indicative of congestive heart
failure. A degree of superimposed pneumonia in the mid lower lung
zones cannot be entirely excluded. Aortic Atherosclerosis
([id number]).

## 2020-02-16 ENCOUNTER — Ambulatory Visit (INDEPENDENT_AMBULATORY_CARE_PROVIDER_SITE_OTHER): Payer: Medicare Other

## 2020-02-16 DIAGNOSIS — Z0000 Encounter for general adult medical examination without abnormal findings: Secondary | ICD-10-CM

## 2020-02-16 DIAGNOSIS — Z599 Problem related to housing and economic circumstances, unspecified: Secondary | ICD-10-CM | POA: Diagnosis not present

## 2020-02-16 NOTE — Progress Notes (Signed)
Subjective:   Sharon Bullock is a 86 y.o. female who presents for Medicare Annual (Subsequent) preventive examination.  Virtual Visit via Telephone Note  I connected with  Sharon Bullock on 02/16/20 at  8:40 AM EDT by telephone and verified that I am speaking with the correct person using two identifiers.  Medicare Annual Wellness visit completed telephonically due to Covid-19 pandemic.   Location: Patient: home Provider: [facility]   I discussed the limitations, risks, security and privacy concerns of performing an evaluation and management service by telephone and the availability of in person appointments. The patient expressed understanding and agreed to proceed.  Unable to perform video visit due to video visit attempted and failed and/or patient does not have video capability.   Some vital signs may be absent or patient reported.   Reather Littler, LPN    Review of Systems     Cardiac Risk Factors include: advanced age (>81men, >3 women);hypertension     Objective:    Today's Vitals   02/16/20 0857  PainSc: 7    There is no height or weight on file to calculate BMI.  Advanced Directives 02/16/2020 01/25/2019 11/30/2018 11/30/2018 06/11/2018 04/19/2018 01/12/2018  Does Patient Have a Medical Advance Directive? Yes Yes No No Yes Yes Yes  Type of Estate agent of [place];Living will Healthcare Power of [place];Living will - - Healthcare Power of [place];Living will Living will Healthcare Power of [place];Living will  Does patient want to make changes to medical advance directive? - - - - No - Patient declined No - Patient declined -  Copy of Healthcare Power of Attorney in Chart? No - copy requested No - copy requested - - No - copy requested - No - copy requested  Would patient like information on creating a medical advance directive? - - No - Patient declined No - Patient declined - - -    Current Medications (verified) Outpatient Encounter Medications as of  02/16/2020  Medication Sig  . amiodarone (PACERONE) 200 MG tablet Take 1 tablet by mouth daily.  Marland Kitchen amLODipine (NORVASC) 10 MG tablet Take 1 tablet (10 mg total) by mouth daily.  . carvedilol (COREG) 25 MG tablet TAKE 1 TABLET(25 MG) BY MOUTH TWICE DAILY WITH A MEAL  . dabigatran (PRADAXA) 150 MG CAPS capsule Take 150 mg by mouth 2 (two) times daily.  . ferrous gluconate (FERGON) 324 MG tablet Take 324 mg by mouth daily with breakfast.  . folic acid (FOLVITE) 400 MCG tablet Take 400 mcg by mouth daily.   . irbesartan (AVAPRO) 300 MG tablet Take 1 tablet (300 mg total) by mouth daily.  Marland Kitchen omeprazole (PRILOSEC) 40 MG capsule TAKE 1 CAPSULE(40 MG) BY MOUTH DAILY  . rosuvastatin (CRESTOR) 10 MG tablet Take 1 tablet (10 mg total) by mouth daily.  . solifenacin (VESICARE) 5 MG tablet Take 1 tablet (5 mg total) by mouth daily.  Marland Kitchen spironolactone (ALDACTONE) 25 MG tablet Take 1 tablet by mouth daily.  . Vitamin D, Cholecalciferol, 1000 UNITS TABS Take 1,000 Units by mouth daily.   Marland Kitchen zolpidem (AMBIEN) 5 MG tablet Take 1 tablet (5 mg total) by mouth at bedtime.   Facility-Administered Encounter Medications as of 02/16/2020  Medication  . cyanocobalamin ((VITAMIN B-12)) injection 1,000 mcg    Allergies (verified) Nickel, Other, Ace inhibitors, and Xarelto [rivaroxaban]   History: Past Medical History:  Diagnosis Date  . Allergy   . Enlarged heart 2000  . GERD (gastroesophageal reflux disease)   . Hypertension   .  Insomnia   . Vertigo 2006   Past Surgical History:  Procedure Laterality Date  . BLADDER REPAIR  2014   tact   Family History  Problem Relation Age of Onset  . Alzheimer's disease Mother   . Hypertension Mother   . Stroke Father   . Heart disease Father   . Heart disease Brother   . Hypertension Brother   . Cancer Daughter        breast  . Hypertension Daughter   . Breast cancer Daughter   . Diabetes Brother   . Heart disease Brother   . Heart disease Brother   .  Diabetes Brother   . Hypertension Brother   . Heart disease Brother   . Hypertension Brother   . Cancer Brother        prostate  . Diabetes Brother   . Heart disease Brother   . Hypertension Brother   . Cancer Brother        prostate   Social History   Socioeconomic History  . Marital status: Widowed    Spouse name: Not on file  . Number of children: 5  . Years of education: some college  . Highest education level: 12th grade  Occupational History  . Occupation: Retired  Tobacco Use  . Smoking status: Never Smoker  . Smokeless tobacco: Never Used  . Tobacco comment: smoking cessation materials not required  Vaping Use  . Vaping Use: Never used  Substance and Sexual Activity  . Alcohol use: No    Alcohol/week: 0.0 standard drinks  . Drug use: No  . Sexual activity: Not Currently  Other Topics Concern  . Not on file  Social History Narrative   Lost husband 07/18/2017   Grown son still living with her    Social Determinants of Health   Financial Resource Strain: High Risk  . Difficulty of Paying Living Expenses: Very hard  Food Insecurity: No Food Insecurity  . Worried About Programme researcher, broadcasting/film/video in the Last Year: Never true  . Ran Out of Food in the Last Year: Never true  Transportation Needs: Unmet Transportation Needs  . Lack of Transportation (Medical): Yes  . Lack of Transportation (Non-Medical): Yes  Physical Activity: Inactive  . Days of Exercise per Week: 0 days  . Minutes of Exercise per Session: 0 min  Stress: No Stress Concern Present  . Feeling of Stress : Only a little  Social Connections: Unknown  . Frequency of Communication with Friends and Family: Patient refused  . Frequency of Social Gatherings with Friends and Family: Patient refused  . Attends Religious Services: Patient refused  . Active Member of Clubs or Organizations: Patient refused  . Attends Banker Meetings: Patient refused  . Marital Status: Widowed    Tobacco  Counseling Counseling given: Not Answered Comment: smoking cessation materials not required   Clinical Intake:  Pre-visit preparation completed: Yes  Pain : 0-10 Pain Score: 7  Pain Type: Chronic pain Pain Location: Leg Pain Orientation: Right Pain Descriptors / Indicators: Aching, Throbbing Pain Onset: More than a month ago Pain Frequency: Intermittent     Nutritional Risks: None Diabetes: No  How often do you need to have someone help you when you read instructions, pamphlets, or other written materials from your doctor or pharmacy?: 1 - Never    Interpreter Needed?: No  Information entered by :: Reather Littler LPN   Activities of Daily Living In your present state of health, do you have any  difficulty performing the following activities: 02/16/2020 12/26/2019  Hearing? Y Y  Comment declines hearing aids -  Vision? N N  Difficulty concentrating or making decisions? N N  Walking or climbing stairs? Y Y  Dressing or bathing? N N  Doing errands, shopping? N N  Preparing Food and eating ? N -  Using the Toilet? N -  In the past six months, have you accidently leaked urine? Y -  Comment wears pads at night -  Do you have problems with loss of bowel control? N -  Managing your Medications? N -  Managing your Finances? N -  Housekeeping or managing your Housekeeping? N -  Some recent data might be hidden    Patient Care Team: Alba Cory, MD as PCP - General (Family Medicine) Nolon Rod, MD as Consulting Physician (Internal Medicine) Rodena Medin, [facility] as Pharmacist (Pharmacist) Creig Hines, MD as Consulting Physician (Oncology)  Indicate any recent Medical Services you may have received from other than [facility] providers in the past year (date may be approximate).     Assessment:   This is a routine wellness examination for Sharon Bullock.  Hearing/Vision screen  Hearing Screening   125Hz  250Hz  500Hz  1000Hz  2000Hz  3000Hz  4000Hz  6000Hz  8000Hz   Right ear:            Left ear:           Comments: Pt c/o being hard of hearing in right ear, declines hearing aids  Vision Screening Comments: [facility] screenings at [facility]  Dietary issues and exercise activities discussed: Current Exercise Habits: The patient does not participate in regular exercise at present, Exercise limited by: orthopedic condition(s);neurologic condition(s)  Goals    .  "I migiht need help paying my house payment and other bills because money is tight now" (pt-stated)      Patient reports financial difficulties and is willing to discuss resources/help with social work or case management team.   Social Work Recommendations:   Magazine features editor [phone])- assist with budgeting, debt management, mortgage Risk manager [phone]); [facility] [phone]) - assist with debt management, budgeting  Contact personal mortgage company to filed for hardship due to death of spouse  [facility] [phone]) - immediate limited short term financial support if patient qualifies   Clinical Goals: Over the next 7 days, patient will reach out to organizations listed above to inquire about  available resources for financial support.   Interventions: Consultation with [facility] Community Care Management LCSW, outreach to patient/son to provide contact information     .  "I need help with depression after losing my husband" (pt-stated)      Patient states she is in grief and feeling depressed about losing her husband of 59 years recently. She began seeing a counselor via [employer] employee assistance program and is scheduled to go back for a session tomorrow. She discussed her depression with Dr. Carlynn Purl today who has prescribed Sertraline.   Clinical Goals: Over the next 14 days, patient will report ongoing work with therapist associated with [employer] and patient will  report adherence to new medication as prescribed.   Interventions: Discussed patient resources re: grief support. Scheduled follow up assessment.      Marland Kitchen  Heart Failure      CARE PLAN ENTRY (see longitudinal plan of care for additional care plan information)  Current Barriers:  . Controlled heart failure, complicated by diabetes, hypertension .  Current heart failure regimen: amiodarone, Coreg, irbesartan, spironolactone . Previous heart failure medications tried: furosemide . Patient reads package insert very carefully and has failed Eliquis and Xarelto due to rare adverse events listed in the patient information  Pharmacist Clinical Goal(s):  Marland Kitchen Over the next 90 days, patient will work with PharmD and providers to optimize heart failure regimen . Ensure that patient is comfortable with Pradaxa and can afford it  Interventions: . Inter-disciplinary care team collaboration (see longitudinal plan of care) . Comprehensive medication review performed; medication list updated in the electronic medical record.  . Explained that adverse events are rare and that the benefit of an anticoagulant far outweighs these issues  Patient Self Care Activities:  . Patient will not focus on the rare adverse events of a life saving medication . Patient will focus on medication adherence by remaining on Pradaxa and tolerating minor adverse events  Initial goal documentation     .  Hyperlipidemia - goal LDL < 70      CARE PLAN ENTRY (see longitudinal plan of care for additional care plan information)  Current Barriers:  . Technically Uncontrolled hyperlipidemia, complicated by heart failure, HTN . Current antihyperlipidemic regimen: Crestor 10mg  daily, Co-Q10 . Previous antihyperlipidemic medications tried NA . Most recent lipid panel:     Component Value Date/Time   CHOL 151 05/06/2019 1225   CHOL 192 08/17/2015 1115   TRIG 48 05/06/2019 1225   HDL 65 05/06/2019 1225   HDL 96 08/17/2015 1115    CHOLHDL 2.3 05/06/2019 1225   VLDL 13 05/26/2016 0957   LDLCALC 72 05/06/2019 1225 .   05/08/2019 ASCVD risk enhancing conditions: age >47, DM, HTN, CKD, CHF .  Pharmacist Clinical Goal(s):  >73 Over the next 90 days, patient will work with PharmD and providers towards optimized antihyperlipidemic therapy  Interventions: . Comprehensive medication review performed; medication list updated in electronic medical record.  Marland Kitchen care team collaboration (see longitudinal plan of care) . Explained that minor dietary changes can reduce the LDL by at least 3 points to achieve goal.  Patient Self Care Activities:  . Patient will focus on medication adherence by eating home cooked meals and limit snacking  Initial goal documentation     .  Insomnia - goal to feel rested       CARE PLAN ENTRY (see longitudinal plan of care for additional care plan information)  Current Barriers:  . Polypharmacy; complex patient with multiple comorbidities including heart failure, hyperlipidemia, and hypertension . Was able to sleep with Ambien 10mg  nightly, however guidelines for age and gender limit the dosage to 5mg  nightly . Unfortunately she is still having trouble sleeping on Ambien 5mg  nightly   Pharmacist Clinical Goal(s):  [name] Over the next 30 days, patient will work with PharmD and provider towards better sleep through sleep hygiene and melatonin OTC . Over the next 90 days potentially discontinue Ambien entirely  Interventions: . Comprehensive medication review performed; medication list updated in electronic medical record . Inter-disciplinary care team collaboration (see longitudinal plan of care) . Melatonin 1 mg nightly to supplement Ambien  Patient Self Care Activities:  . Patient will purchase OTC melatonin 1 mg and take nightly with her Ambien  . In 90 days, attempt sleep without Ambien  Initial goal documentation     .  Prevent falls      Recommend to remove any items from the  home that may cause slips or trips.      Depression Screen  PHQ 2/9 Scores 02/16/2020 12/26/2019 08/25/2019 05/06/2019 02/04/2019 01/25/2019 12/08/2018  PHQ - 2 Score 2 2 0 0 0 0 0  PHQ- 9 Score 11 12 0 0 0 - 0    Fall Risk Fall Risk  02/16/2020 12/26/2019 08/25/2019 05/06/2019 02/04/2019  Falls in the past year? 1 1 0 1 0  Number falls in past yr: 1 1 0 0 0  Injury with Fall? 0 0 0 1 0  Risk for fall due to : History of fall(s);Impaired balance/gait - - History of fall(s) -  Risk for fall due to: Comment - - - - -  Follow up Falls prevention discussed - - Falls evaluation completed -    Any stairs in or around the home? Yes  If so, are there any without handrails? No  Home free of loose throw rugs in walkways, pet beds, electrical cords, etc? Yes  Adequate lighting in your home to reduce risk of falls? Yes   ASSISTIVE DEVICES UTILIZED TO PREVENT FALLS:  Life alert? Yes  Use of a cane, walker or w/c? Yes  Grab bars in the bathroom? No  Shower chair or bench in shower? Yes  Elevated toilet seat or a handicapped toilet? No   TIMED UP AND GO:  Was the test performed? No . Telephonic visit.   Cognitive Function:     6CIT Screen 02/16/2020 01/12/2018  What Year? 0 points 0 points  What month? 0 points 0 points  What time? 0 points 0 points  Count back from 20 0 points 0 points  Months in reverse 0 points 0 points  Repeat phrase 2 points 2 points  Total Score 2 2    Immunizations Immunization History  Administered Date(s) Administered  . Fluad Quad(high Dose 65+) 01/25/2019, 12/26/2019  . Influenza Nasal 01/13/2016  . Influenza Split 04/13/2008, 01/18/2009, 12/03/2009  . Influenza, High Dose Seasonal PF 01/03/2015, 01/23/2016, 01/01/2017, 12/18/2017  . Influenza, Seasonal, Injecte, Preservative Fre 12/25/2010, 01/19/2012  . Influenza,inj,Quad PF,6+ Mos 12/27/2012, 01/24/2014  . Influenza-Unspecified 12/25/2010, 12/27/2012  . Pneumococcal Conjugate-13 01/24/2014, 04/19/2015    . Pneumococcal Polysaccharide-23 08/27/2009, 01/24/2014  . Td 08/27/2009  . Tdap 08/27/2009    TDAP status: Due, Education has been provided regarding the importance of this vaccine. Advised may receive this vaccine at local pharmacy or Health Dept. Aware to provide a copy of the vaccination record if obtained from local pharmacy or Health Dept. Verbalized acceptance and understanding.   Flu Vaccine status: Up to date   Pneumococcal vaccine status: Up to date   Covid-19 vaccine status: Completed vaccines;   Qualifies for Shingles Vaccine? Yes   Zostavax completed No   Shingrix Completed?: No.    Education has been provided regarding the importance of this vaccine. Patient has been advised to call insurance company to determine out of pocket expense if they have not yet received this vaccine. Advised may also receive vaccine at local pharmacy or Health Dept. Verbalized acceptance and understanding.  Screening Tests Health Maintenance  Topic Date Due  . COVID-19 Vaccine (1) Never done  . TETANUS/TDAP  08/28/2019  . INFLUENZA VACCINE  Completed  . DEXA SCAN  Completed  . PNA vac Low Risk Adult  Completed    Health Maintenance  Health Maintenance Due  Topic Date Due  . COVID-19 Vaccine (1) Never done  . TETANUS/TDAP  08/28/2019    Colorectal cancer screening: No longer required.    Mammogram status: No longer required.    Bone density screening: no  longer required  Lung Cancer Screening: (Low Dose CT Chest recommended if Age 61-80 years, 30 pack-year currently smoking OR have quit w/in 15years.) does not qualify.   Additional Screening:  Hepatitis C Screening: does not qualify;  Vision Screening: Recommended annual ophthalmology exams for early detection of glaucoma and other disorders of the eye. Is the patient up to date with their annual eye exam?  Yes  Who is the provider or what is the name of the office in which the patient attends annual eye exams? [facility]   Dental Screening: Recommended annual dental exams for proper oral hygiene  Community Resource Referral / Chronic Care Management: CRR required this visit?  Yes  CCM required this visit?  No      Plan:     I have personally reviewed and noted the following in the patient's chart:   . Medical and social history . Use of alcohol, tobacco or illicit drugs  . Current medications and supplements . Functional ability and status . Nutritional status . Physical activity . Advanced directives . List of other physicians . Hospitalizations, surgeries, and ER visits in previous 12 months . Vitals . Screenings to include cognitive, depression, and falls . Referrals and appointments  In addition, I have reviewed and discussed with patient certain preventive protocols, quality metrics, and best practice recommendations. A written personalized care plan for preventive services as well as general preventive health recommendations were provided to patient.     Reather Littler, LPN   [id number]   Nurse Notes: none

## 2020-02-16 NOTE — Patient Instructions (Signed)
[name] , Thank you for taking time to come for your Medicare Wellness Visit. I appreciate your ongoing commitment to your health goals. Please review the following plan we discussed and let me know if I can assist you in the future.   Screening recommendations/referrals: Colonoscopy: no longer required Mammogram: no longer required Bone Density: no longer required Recommended yearly ophthalmology/optometry visit for glaucoma screening and checkup Recommended yearly dental visit for hygiene and checkup  Vaccinations: Influenza vaccine: done 12/26/19 Pneumococcal vaccine: done 04/19/15 Tdap vaccine: due Shingles vaccine: Shingrix discussed. Please contact your pharmacy for coverage information.  Covid-19: please bring your vaccination record with you to your next appt  Advanced directives: Please bring a copy of your health care power of attorney and living will to the office at your convenience.  Conditions/risks identified: Recommend fall prevention in the home  Next appointment: Follow up in one year for your annual wellness visit    Preventive Care 65 Years and Older, Female Preventive care refers to lifestyle choices and visits with your health care provider that can promote health and wellness. What does preventive care include?  A yearly physical exam. This is also called an annual well check.  Dental exams once or twice a year.  Routine eye exams. Ask your health care provider how often you should have your eyes checked.  Personal lifestyle choices, including:  Daily care of your teeth and gums.  Regular physical activity.  Eating a healthy diet.  Avoiding tobacco and drug use.  Limiting alcohol use.  Practicing safe sex.  Taking low-dose aspirin every day.  Taking vitamin and mineral supplements as recommended by your health care provider. What happens during an annual well check? The services and screenings done by your health care provider during your  annual well check will depend on your age, overall health, lifestyle risk factors, and family history of disease. Counseling  Your health care provider may ask you questions about your:  Alcohol use.  Tobacco use.  Drug use.  Emotional well-being.  Home and relationship well-being.  Sexual activity.  Eating habits.  History of falls.  Memory and ability to understand (cognition).  Work and work Astronomer.  Reproductive health. Screening  You may have the following tests or measurements:  Height, weight, and BMI.  Blood pressure.  Lipid and cholesterol levels. These may be checked every 5 years, or more frequently if you are over 69 years old.  Skin check.  Lung cancer screening. You may have this screening every year starting at age 20 if you have a 30-pack-year history of smoking and currently smoke or have quit within the past 15 years.  Fecal occult blood test (FOBT) of the stool. You may have this test every year starting at age 48.  Flexible sigmoidoscopy or colonoscopy. You may have a sigmoidoscopy every 5 years or a colonoscopy every 10 years starting at age 42.  Hepatitis C blood test.  Hepatitis B blood test.  Sexually transmitted disease (STD) testing.  Diabetes screening. This is done by checking your blood sugar (glucose) after you have not eaten for a while (fasting). You may have this done every 1-3 years.  Bone density scan. This is done to screen for osteoporosis. You may have this done starting at age 62.  Mammogram. This may be done every 1-2 years. Talk to your health care provider about how often you should have regular mammograms. Talk with your health care provider about your test results, treatment options, and if  necessary, the need for more tests. Vaccines  Your health care provider may recommend certain vaccines, such as:  Influenza vaccine. This is recommended every year.  Tetanus, diphtheria, and acellular pertussis (Tdap, Td)  vaccine. You may need a Td booster every 10 years.  Zoster vaccine. You may need this after age 45.  Pneumococcal 13-valent conjugate (PCV13) vaccine. One dose is recommended after age 35.  Pneumococcal polysaccharide (PPSV23) vaccine. One dose is recommended after age 21. Talk to your health care provider about which screenings and vaccines you need and how often you need them. This information is not intended to replace advice given to you by your health care provider. Make sure you discuss any questions you have with your health care provider. Document Released: 04/27/2015 Document Revised: 12/19/2015 Document Reviewed: 01/30/2015 Elsevier Interactive Patient Education  2017 [place] Prevention in the Home Falls can cause injuries. They can happen to people of all ages. There are many things you can do to make your home safe and to help prevent falls. What can I do on the outside of my home?  Regularly fix the edges of walkways and driveways and fix any cracks.  Remove anything that might make you trip as you walk through a door, such as a raised step or threshold.  Trim any bushes or trees on the path to your home.  Use bright outdoor lighting.  Clear any walking paths of anything that might make someone trip, such as rocks or tools.  Regularly check to see if handrails are loose or broken. Make sure that both sides of any steps have handrails.  Any raised decks and porches should have guardrails on the edges.  Have any leaves, snow, or ice cleared regularly.  Use sand or salt on walking paths during winter.  Clean up any spills in your garage right away. This includes oil or grease spills. What can I do in the bathroom?  Use night lights.  Install grab bars by the toilet and in the tub and shower. Do not use towel bars as grab bars.  Use non-skid mats or decals in the tub or shower.  If you need to sit down in the shower, use a plastic, non-slip  stool.  Keep the floor dry. Clean up any water that spills on the floor as soon as it happens.  Remove soap buildup in the tub or shower regularly.  Attach bath mats securely with double-sided non-slip rug tape.  Do not have throw rugs and other things on the floor that can make you trip. What can I do in the bedroom?  Use night lights.  Make sure that you have a light by your bed that is easy to reach.  Do not use any sheets or blankets that are too big for your bed. They should not hang down onto the floor.  Have a firm chair that has side arms. You can use this for support while you get dressed.  Do not have throw rugs and other things on the floor that can make you trip. What can I do in the kitchen?  Clean up any spills right away.  Avoid walking on wet floors.  Keep items that you use a lot in easy-to-reach places.  If you need to reach something above you, use a strong step stool that has a grab bar.  Keep electrical cords out of the way.  Do not use floor polish or wax that makes floors slippery. If you must  use wax, use non-skid floor wax.  Do not have throw rugs and other things on the floor that can make you trip. What can I do with my stairs?  Do not leave any items on the stairs.  Make sure that there are handrails on both sides of the stairs and use them. Fix handrails that are broken or loose. Make sure that handrails are as long as the stairways.  Check any carpeting to make sure that it is firmly attached to the stairs. Fix any carpet that is loose or worn.  Avoid having throw rugs at the top or bottom of the stairs. If you do have throw rugs, attach them to the floor with carpet tape.  Make sure that you have a light switch at the top of the stairs and the bottom of the stairs. If you do not have them, ask someone to add them for you. What else can I do to help prevent falls?  Wear shoes that:  Do not have high heels.  Have rubber bottoms.  Are  comfortable and fit you well.  Are closed at the toe. Do not wear sandals.  If you use a stepladder:  Make sure that it is fully opened. Do not climb a closed stepladder.  Make sure that both sides of the stepladder are locked into place.  Ask someone to hold it for you, if possible.  Clearly mark and make sure that you can see:  Any grab bars or handrails.  First and last steps.  Where the edge of each step is.  Use tools that help you move around (mobility aids) if they are needed. These include:  Canes.  Walkers.  Scooters.  Crutches.  Turn on the lights when you go into a dark area. Replace any light bulbs as soon as they burn out.  Set up your furniture so you have a clear path. Avoid moving your furniture around.  If any of your floors are uneven, fix them.  If there are any pets around you, be aware of where they are.  Review your medicines with your doctor. Some medicines can make you feel dizzy. This can increase your chance of falling. Ask your doctor what other things that you can do to help prevent falls. This information is not intended to replace advice given to you by your health care provider. Make sure you discuss any questions you have with your health care provider. Document Released: 01/25/2009 Document Revised: 09/06/2015 Document Reviewed: 05/05/2014 Elsevier Interactive Patient Education  2017 [employer].

## 2020-02-20 ENCOUNTER — Telehealth: Payer: Self-pay

## 2020-02-20 NOTE — Telephone Encounter (Signed)
    MA11/11/2019 1st Attempt  Name: Sharon Bullock   MRN: [id number]   DOB: November 18, 1933   AGE: 86 y.o.   GENDER: female   PCP Alba Cory, MD.   02/20/20 Spoke with patient about resources for mortgage assistance and home repair. Reminded patient to talk with her mortgage company about deferring her next payment or possibly refinancing. Games developer to [employer] to mail to patient.  I will contact patient within the next 7 days to ensure she has received the letter.    Lucylle Foulkes, AAS Paralegal, [facility] Care Guide . Embedded Care Coordination [facility] Health  Care Management  [address] [place], [place] [address] [email or web address]  [phone]   [email or web address]

## 2020-02-24 ENCOUNTER — Telehealth: Payer: Self-pay

## 2020-02-24 NOTE — Telephone Encounter (Signed)
    MA11/03/2020   Name: Sharon Bullock   MRN: [id number]   DOB: Feb 20, 1934   AGE: 86 y.o.   GENDER: female   PCP Alba Cory, MD.   02/24/20 Spoke with patient she has not contacted any of the resources given by myself or the social worker regarding consumer credit counseling, debt management, mortgage assistance or home repair.  Patient stated that she planned to call her mortgage company in the next few days.  I will follow-up with patient next week to ensure she has received resource letter.     Duey Liller, AAS Paralegal, [facility] Care Guide . Embedded Care Coordination [facility] Health  Care Management  [address] [place], [place] [address] [email or web address]  [phone]   [email or web address]

## 2020-02-28 ENCOUNTER — Other Ambulatory Visit: Payer: Self-pay | Admitting: Family Medicine

## 2020-02-28 DIAGNOSIS — J3089 Other allergic rhinitis: Secondary | ICD-10-CM

## 2020-02-28 NOTE — Telephone Encounter (Signed)
Requested medication (s) are due for refill today: yes  Requested medication (s) are on the active medication list: no  Last refill:  07/31/19  Future visit scheduled: yes  Notes to clinic:  was dc'd 12/26/19 "no longer needed"-    Requested Prescriptions  Pending Prescriptions Disp Refills   levocetirizine (XYZAL) 5 MG tablet [Pharmacy Med Name: LEVOCETIRIZINE 5MG  TABLETS] 90 tablet 1    Sig: TAKE 1 TABLET(5 MG) BY MOUTH EVERY EVENING      Ear, Nose, and Throat:  Antihistamines Passed - 02/28/2020 10:04 AM      Passed - Valid encounter within last 12 months    Recent Outpatient Visits           2 months ago Need for immunization against influenza   [facility], MD   6 months ago B12 deficiency   [facility], [place]   9 months ago Dizziness   [facility], MD   1 year ago Paroxysmal A-fib [facility])   [facility], MD   1 year ago Acute respiratory failure with hypoxia [facility])   [facility], Doren Custard       Future Appointments             In 3 weeks [place], [place] [facility]   In 11 months  [facility], [facility]

## 2020-02-29 ENCOUNTER — Telehealth: Payer: Self-pay

## 2020-02-29 DIAGNOSIS — M25551 Pain in right hip: Secondary | ICD-10-CM | POA: Diagnosis not present

## 2020-02-29 DIAGNOSIS — M7061 Trochanteric bursitis, right hip: Secondary | ICD-10-CM | POA: Diagnosis not present

## 2020-02-29 NOTE — Telephone Encounter (Signed)
    MA11/17/2021   Name: Sharon Bullock   MRN: [id number]   DOB: 27-Sep-1933   AGE: 86 y.o.   GENDER: female   PCP Alba Cory, MD.   02/29/20 Unable to leave message on voicemail for patient to return my call regarding resources given for home repair and mortgage assistance. Will call again this week.    [name], AAS Paralegal, [facility] Care Guide . Embedded Care Coordination [facility] Health  Care Management  [address] [place], [place] [address] [name]  [phone]   [email or web address]

## 2020-03-01 ENCOUNTER — Telehealth: Payer: Self-pay

## 2020-03-01 NOTE — Telephone Encounter (Signed)
    MA11/18/2021   Name: Sharon Bullock   MRN: [id number]   DOB: [birth year]   AGE: 86 y.o.   GENDER: female   PCP Alba Cory, MD.   03/01/20 Attempted to call  patient she picked up phone and is not interested in assistance at this time and hung up.  Closing referral resources have been mailed to patient.     Zacchary Pompei, AAS Paralegal, [facility] Care Guide . Embedded Care Coordination [facility] Health  Care Management  [address] [place], [place] [address] [email or web address]  [phone]   [email or web address]

## 2020-03-16 ENCOUNTER — Ambulatory Visit: Payer: Self-pay | Admitting: Podiatry

## 2020-03-21 NOTE — Progress Notes (Addendum)
Name: Sharon Bullock   MRN: [id number]    DOB: 02-21-1934   Date:03/22/2020       Progress Note  Subjective  Chief Complaint  Follow Up  HPI  HTN:bp is well controlled,  she denies chest pain , she occasionally has  palpitation, she has some sob with activity. She has vertigo but denies orthostatic changes  History of lumbar spondylosis with radiculitis: she was seen at [facility] 03/2018 and left  side symptoms resolved, however she had to stop therapy because of pandemic, she has noticed pain from right groin down to lateral part of right leg and foot, she has been using a cane because she is afraid she will fall, she has been leaning forward and also feels like her leg will give out. She cannot go back to [facility] because of transportation , we referred her to Ortho locally and she saw Dr. Rosita Kea 12/2019 , she was diagnosed with trochanteric bursitis and states after steroid injection she has been feeling better   Carotid atherosclerosis: found during MRI head done at [facility] for evaluation of vertigo in 12/2015. On medical management at this time. She also has mesenteric insufficiency, she denies abdominal pain or blood in stools.Taking Crestorand Pradaxa ( could not tolerate Xarelto)   Afib/CHF: she sees a cardiologist - Dr. Regino Schultze at [facility]. She has been on Pradaxa but recently changed to Xarelto but decided to go back on Pradaxa states heartburn not as severe since she has been taking it with food and drinking more water . Last CHF admission 11/2018  She has been tired, she has chronic orthopnea, she has mild swelling of lower legs that is worse during the day . She has tried compression stocking hoses but it was too hard to get them on. Discussed trying otc compression stockings with a zipper   INTERPRETATION 04/29 2021 at [facility]   NORMAL LEFT VENTRICULAR SYSTOLIC FUNCTION WITH MODERATE LVH  NORMAL RIGHT VENTRICULAR SYSTOLIC FUNCTION  VALVULAR REGURGITATION: MILD AR, MODERATE MR, MILD  PR, MODERATE TR  NO VALVULAR STENOSIS  TRIVIAL PERICARDIAL EFFUSION  Insomnia: She tried Trazodone without help, only able to sleep one hour per night,she is on Ambien 10 mg, tried Ambien 5 mg without much help, but insurance stopped paying for it, so she is back on 5 mg. Diuretics causes nocturia.She needs refills of medication   Hypertrophic cardiomyopathy and hypertensive pulmonary vascular disease: she sees Dr. Regino Schultze, no chest pain, she has sob with activity , she denies paroxysmal nocturnal dyspnea, the bilateral swellingon legs is stable, better in am and worse at night.She is on ARB, beta-blocker and Pradaxa Unchanged   Dyslipidemia: she is on Crestor and CoQ 10, denies side effects of medication, advised to always bring all medications to all doctor visits on her last visit but she forgot to bring it again . Last LDL was not at goal up from 72 to 103. Goal is below 70. Not sure if she is taking medication   Perennial allergic rhinitis: she has post-nasal drainage , usually clear to cloudy in color and sometimes it makes her feel sob. She is taking Xyzal and symptoms are stable .  CKI stage III: her kidney function has dropped a little, normal urine output, no pruritis   Thrombocytopenia: she is willing to have labs done today. Denies easy bleeding , she has easy bruising on her arms but stable. Senile purpura is stable    Patient Active Problem List   Diagnosis Date Noted  .  Pericardial effusion 04/18/2019  . Moderate major depression, single episode (HCC) 08/03/2018  . Thrombocytopenia (HCC) 05/27/2016  . CHF NYHA class I, chronic, diastolic (HCC) 05/26/2016  . Mesenteric vascular insufficiency (HCC) 02/28/2016  . Pancreas cyst 02/28/2016  . Increased endometrial stripe thickness 02/28/2016  . Carotid atherosclerosis, bilateral 01/23/2016  . Hearing loss 08/17/2015  . Mitral regurgitation 04/19/2015  . Pernicious anemia 09/27/2014  . Paroxysmal A-fib (HCC)  09/27/2014  . Benign essential HTN 09/27/2014  . Perennial allergic rhinitis 09/27/2014  . Chronic anemia 09/27/2014  . Insomnia, persistent 09/27/2014  . Hypertensive pulmonary vascular disease (HCC) 09/27/2014  . B12 deficiency 09/27/2014  . Diverticulosis of colon 09/27/2014  . Dyslipidemia 09/27/2014  . Edema extremities 09/27/2014  . Gastric reflux 09/27/2014  . Bilateral hearing loss 09/27/2014  . Polypharmacy 09/27/2014  . Pelvic pain in female 09/27/2014  . MI (mitral incompetence) 09/27/2014  . Internal hemorrhoids 09/27/2014  . Osteopenia 09/27/2014  . Arthralgia of shoulder 09/27/2014  . Finger tendinitis 09/27/2014  . Urge incontinence 09/27/2014  . Cervical prolapse 09/27/2014  . Vertigo 09/27/2014  . Vitamin D deficiency 09/27/2014  . Bladder cystocele 04/15/2012  . Asymmetric septal hypertrophy (HCC) 05/19/2011  . Hypertrophic cardiomyopathy (HCC) 05/19/2011    Past Surgical History:  Procedure Laterality Date  . BLADDER REPAIR  2014   tact    Family History  Problem Relation Age of Onset  . Alzheimer's disease Mother   . Hypertension Mother   . Stroke Father   . Heart disease Father   . Heart disease Brother   . Hypertension Brother   . Cancer Daughter        breast  . Hypertension Daughter   . Breast cancer Daughter   . Diabetes Brother   . Heart disease Brother   . Heart disease Brother   . Diabetes Brother   . Hypertension Brother   . Heart disease Brother   . Hypertension Brother   . Cancer Brother        prostate  . Diabetes Brother   . Heart disease Brother   . Hypertension Brother   . Cancer Brother        prostate    Social History   Tobacco Use  . Smoking status: Never Smoker  . Smokeless tobacco: Never Used  . Tobacco comment: smoking cessation materials not required  Substance Use Topics  . Alcohol use: No    Alcohol/week: 0.0 standard drinks     Current Outpatient Medications:  .  amiodarone (PACERONE) 200 MG  tablet, Take 1 tablet by mouth daily., Disp: , Rfl:  .  amLODipine (NORVASC) 10 MG tablet, Take 1 tablet (10 mg total) by mouth daily., Disp: 90 tablet, Rfl: 1 .  carvedilol (COREG) 25 MG tablet, TAKE 1 TABLET(25 MG) BY MOUTH TWICE DAILY WITH A MEAL, Disp: 180 tablet, Rfl: 1 .  dabigatran (PRADAXA) 150 MG CAPS capsule, Take 150 mg by mouth 2 (two) times daily., Disp: , Rfl:  .  ferrous gluconate (FERGON) 324 MG tablet, Take 324 mg by mouth daily with breakfast., Disp: , Rfl:  .  folic acid (FOLVITE) 400 MCG tablet, Take 400 mcg by mouth daily. , Disp: , Rfl:  .  levocetirizine (XYZAL) 5 MG tablet, TAKE 1 TABLET(5 MG) BY MOUTH EVERY EVENING, Disp: 90 tablet, Rfl: 1 .  rosuvastatin (CRESTOR) 10 MG tablet, Take 1 tablet (10 mg total) by mouth daily., Disp: 90 tablet, Rfl: 1 .  solifenacin (VESICARE) 5 MG tablet, Take 1  tablet (5 mg total) by mouth daily., Disp: 30 tablet, Rfl: 2 .  spironolactone (ALDACTONE) 25 MG tablet, Take 1 tablet by mouth daily., Disp: , Rfl:  .  Vitamin D, Cholecalciferol, 1000 UNITS TABS, Take 1,000 Units by mouth daily. , Disp: , Rfl:  .  irbesartan (AVAPRO) 300 MG tablet, Take 1 tablet (300 mg total) by mouth daily., Disp: 90 tablet, Rfl: 1 .  omeprazole (PRILOSEC) 40 MG capsule, Take 1 capsule (40 mg total) by mouth daily., Disp: 90 capsule, Rfl: 1 .  zolpidem (AMBIEN) 5 MG tablet, Take 1 tablet (5 mg total) by mouth at bedtime., Disp: 90 tablet, Rfl: 0  Current Facility-Administered Medications:  .  cyanocobalamin ((VITAMIN B-12)) injection 1,000 mcg, 1,000 mcg, Intramuscular, Q30 days, Alba Cory, MD, 1,000 mcg at 12/26/19 1100  Allergies  Allergen Reactions  . Nickel Dermatitis and Swelling  . Other Anaphylaxis    Uncoded Allergy. Allergen: SHELLFISH  . Ace Inhibitors Cough  . Xarelto [Rivaroxaban]     Per patient blurred     I personally reviewed active problem list, medication list, allergies, family history, social history, health maintenance, notes  from last encounter with the patient/caregiver today.   ROS  Constitutional: Negative for fever or weight change.  Respiratory: Negative for cough and shortness of breath.   Cardiovascular: Negative for chest pain or palpitations.  Gastrointestinal: Negative for abdominal pain, no bowel changes.  Musculoskeletal: Negative for gait problem or joint swelling.  Skin: Negative for rash.  Neurological: Negative for dizziness or headache.  No other specific complaints in a complete review of systems (except as listed in HPI above).  Objective  Vitals:   03/22/20 1106  BP: 132/70  Pulse: 60  Resp: 14  Temp: 97.7 F (36.5 C)  TempSrc: Oral  SpO2: 100%  Weight: 148 lb 9.6 oz (67.4 kg)  Height: 5\' 5"  (1.651 m)    Body mass index is 24.73 kg/m.  Physical Exam  Constitutional: Patient appears well-developed and well-nourished.  No distress.  HEENT: head atraumatic, normocephalic, pupils equal and reactive to light,neck supple Cardiovascular: Normal rate, regular rhythm and normal heart sounds. 2 /6 systolic  murmur heard. 1 plus  BLE edema. Pulmonary/Chest: Effort normal and breath sounds normal. No respiratory distress. Abdominal: Soft.  There is no tenderness. Muscular Skeletal: using a cane, walks slowly  Psychiatric: Patient has a normal mood and affect. behavior is normal. Judgment and thought content normal.  Recent Results (from the past 2160 hour(s))  Vitamin B12     Status: Abnormal   Collection Time: 12/26/19 11:49 AM  Result Value Ref Range   Vitamin B-12 >2,000 (H) 200 - 1,100 pg/mL  CBC with Differential/Platelet     Status: Abnormal   Collection Time: 12/26/19 11:49 AM  Result Value Ref Range   WBC 5.2 3.8 - 10.8 Thousand/uL   RBC 3.61 (L) 3.80 - 5.10 Million/uL   Hemoglobin 10.9 (L) 11.7 - 15.5 g/dL   HCT 12/28/19 (L) 47.6 - 54.6 %   MCV 94.2 80.0 - 100.0 fL   MCH 30.2 27.0 - 33.0 pg   MCHC 32.1 32.0 - 36.0 g/dL   RDW 50.3 54.6 - 56.8 %   Platelets 128 (L) 140  - 400 Thousand/uL   MPV 11.3 7.5 - 12.5 fL   Neutro Abs 3,359 1,500 - 7,800 cells/uL   Lymphs Abs 1,274 850 - 3,900 cells/uL   Absolute Monocytes 489 200 - 950 cells/uL   Eosinophils Absolute 57 15 - 500 cells/uL  Basophils Absolute 21 0 - 200 cells/uL   Neutrophils Relative % 64.6 %   Total Lymphocyte 24.5 %   Monocytes Relative 9.4 %   Eosinophils Relative 1.1 %   Basophils Relative 0.4 %  COMPLETE METABOLIC PANEL WITH GFR     Status: Abnormal   Collection Time: 12/26/19 11:49 AM  Result Value Ref Range   Glucose, Bld 91 65 - 99 mg/dL    Comment: .            Fasting reference interval .    BUN 23 7 - 25 mg/dL   Creat 7.22 (H) 5.75 - 0.88 mg/dL    Comment: For patients >34 years of age, the reference limit for Creatinine is approximately 13% higher for people identified as African-American. .    GFR, Est Non African American 49 (L) > OR = 60 mL/min/1.14m2   GFR, Est African American 56 (L) > OR = 60 mL/min/1.48m2   BUN/Creatinine Ratio 22 6 - 22 (calc)   Sodium 139 135 - 146 mmol/L   Potassium 4.2 3.5 - 5.3 mmol/L   Chloride 106 98 - 110 mmol/L   CO2 27 20 - 32 mmol/L   Calcium 9.1 8.6 - 10.4 mg/dL   Total Protein 7.5 6.1 - 8.1 g/dL   Albumin 3.9 3.6 - 5.1 g/dL   Globulin 3.6 1.9 - 3.7 g/dL (calc)   AG Ratio 1.1 1.0 - 2.5 (calc)   Total Bilirubin 0.4 0.2 - 1.2 mg/dL   Alkaline phosphatase (APISO) 72 37 - 153 U/L   AST 22 10 - 35 U/L   ALT 15 6 - 29 U/L  Lipid panel     Status: Abnormal   Collection Time: 12/26/19 11:49 AM  Result Value Ref Range   Cholesterol 192 <200 mg/dL   HDL 76 > OR = 50 mg/dL   Triglycerides 51 <051 mg/dL   LDL Cholesterol (Calc) 103 (H) mg/dL (calc)    Comment: Reference range: <100 . Desirable range <100 mg/dL for primary prevention;   <70 mg/dL for patients with CHD or diabetic patients  with > or = 2 CHD risk factors. Marland Kitchen LDL-C is now calculated using the Martin-Hopkins  calculation, which is a validated novel method providing   better accuracy than the Friedewald equation in the  estimation of LDL-C.  Horald Pollen et al. Lenox Ahr. 8335;825(18): 2061-2068  (http://education.[facility].com/faq/FAQ164)    Total CHOL/HDL Ratio 2.5 <5.0 (calc)   Non-HDL Cholesterol (Calc) 116 <130 mg/dL (calc)    Comment: For patients with diabetes plus 1 major ASCVD risk  factor, treating to a non-HDL-C goal of <100 mg/dL  (LDL-C of <98 mg/dL) is considered a therapeutic  option.       PHQ2/9: Depression screen [facility] 2/9 03/22/2020 02/16/2020 12/26/2019 08/25/2019 05/06/2019  Decreased Interest 0 1 2 0 0  Down, Depressed, Hopeless 0 1 - 0 0  PHQ - 2 Score 0 2 2 0 0  Altered sleeping 0 2 2 0 0  Tired, decreased energy 2 2 3  0 0  Change in appetite 3 3 3  0 0  Feeling bad or failure about yourself  0 0 - 0 0  Trouble concentrating 0 0 0 0 0  Moving slowly or fidgety/restless 0 2 2 0 0  Suicidal thoughts 0 0 0 0 0  PHQ-9 Score 5 11 12  0 0  Difficult doing work/chores - Somewhat difficult Somewhat difficult - -  Some recent data might be hidden    phq 9 is  positive   Fall Risk: Fall Risk  03/22/2020 02/16/2020 12/26/2019 08/25/2019 05/06/2019  Falls in the past year? 1 1 1  0 1  Number falls in past yr: 0 1 1 0 0  Injury with Fall? 0 0 0 0 1  Risk for fall due to : Impaired balance/gait History of fall(s);Impaired balance/gait - - History of fall(s)  Risk for fall due to: Comment - - - - -  Follow up - Falls prevention discussed - - Falls evaluation completed     Functional Status Survey: Is the patient deaf or have difficulty hearing?: Yes Does the patient have difficulty seeing, even when wearing glasses/contacts?: No Does the patient have difficulty concentrating, remembering, or making decisions?: No Does the patient have difficulty walking or climbing stairs?: Yes Does the patient have difficulty dressing or bathing?: No Does the patient have difficulty doing errands alone such as visiting a doctor's office or shopping?:  No    Assessment & Plan  1. Benign essential HTN  - irbesartan (AVAPRO) 300 MG tablet; Take 1 tablet (300 mg total) by mouth daily.  Dispense: 90 tablet; Refill: 1  2. GERD without esophagitis  - omeprazole (PRILOSEC) 40 MG capsule; Take 1 capsule (40 mg total) by mouth daily.  Dispense: 90 capsule; Refill: 1  3. Insomnia, persistent  - zolpidem (AMBIEN) 5 MG tablet; Take 1 tablet (5 mg total) by mouth at bedtime.  Dispense: 90 tablet; Refill: 0  4. Dyslipidemia  Last LD not at goal   5. Paroxysmal A-fib (HCC)  Rate controlled   6. CHF NYHA class I, chronic, diastolic (HCC)  Stable   7. B12 deficiency  B12 today   8. Senile purpura (HCC)  stable  9. Stage 3a chronic kidney disease (HCC)  Stable   10. Thrombocytopenia (HCC)  Stable   11. Mesenteric vascular insufficiency (HCC)  No recent problems   12. Hypertrophic cardiomyopathy (HCC)  Under the care of cardiologist   13. Vitamin D deficiency  Continue supplementation   14. Greater trochanteric bursitis, right  Doing better

## 2020-03-22 ENCOUNTER — Other Ambulatory Visit: Payer: Self-pay

## 2020-03-22 ENCOUNTER — Ambulatory Visit (INDEPENDENT_AMBULATORY_CARE_PROVIDER_SITE_OTHER): Payer: Medicare Other | Admitting: Family Medicine

## 2020-03-22 ENCOUNTER — Encounter: Payer: Self-pay | Admitting: Family Medicine

## 2020-03-22 VITALS — BP 132/70 | HR 60 | Temp 97.7°F | Resp 14 | Ht 65.0 in | Wt 148.6 lb

## 2020-03-22 DIAGNOSIS — N1831 Chronic kidney disease, stage 3a: Secondary | ICD-10-CM | POA: Diagnosis not present

## 2020-03-22 DIAGNOSIS — I480 Paroxysmal atrial fibrillation: Secondary | ICD-10-CM

## 2020-03-22 DIAGNOSIS — I422 Other hypertrophic cardiomyopathy: Secondary | ICD-10-CM

## 2020-03-22 DIAGNOSIS — D692 Other nonthrombocytopenic purpura: Secondary | ICD-10-CM

## 2020-03-22 DIAGNOSIS — K219 Gastro-esophageal reflux disease without esophagitis: Secondary | ICD-10-CM

## 2020-03-22 DIAGNOSIS — I10 Essential (primary) hypertension: Secondary | ICD-10-CM | POA: Diagnosis not present

## 2020-03-22 DIAGNOSIS — I5032 Chronic diastolic (congestive) heart failure: Secondary | ICD-10-CM

## 2020-03-22 DIAGNOSIS — K551 Chronic vascular disorders of intestine: Secondary | ICD-10-CM | POA: Diagnosis not present

## 2020-03-22 DIAGNOSIS — D696 Thrombocytopenia, unspecified: Secondary | ICD-10-CM

## 2020-03-22 DIAGNOSIS — E785 Hyperlipidemia, unspecified: Secondary | ICD-10-CM | POA: Diagnosis not present

## 2020-03-22 DIAGNOSIS — E559 Vitamin D deficiency, unspecified: Secondary | ICD-10-CM

## 2020-03-22 DIAGNOSIS — M7061 Trochanteric bursitis, right hip: Secondary | ICD-10-CM

## 2020-03-22 DIAGNOSIS — G4700 Insomnia, unspecified: Secondary | ICD-10-CM

## 2020-03-22 DIAGNOSIS — E538 Deficiency of other specified B group vitamins: Secondary | ICD-10-CM

## 2020-03-22 MED ORDER — IRBESARTAN 300 MG PO TABS: 300.0000 mg | ORAL_TABLET | Freq: Every day | ORAL | 1 refills | Status: AC

## 2020-03-22 MED ORDER — OMEPRAZOLE 40 MG PO CPDR: 40.0000 mg | DELAYED_RELEASE_CAPSULE | Freq: Every day | ORAL | 1 refills | Status: DC

## 2020-03-22 MED ORDER — ZOLPIDEM TARTRATE 5 MG PO TABS: 5.0000 mg | ORAL_TABLET | Freq: Every day | ORAL | 0 refills | Status: AC

## 2020-03-28 ENCOUNTER — Telehealth: Payer: Self-pay

## 2020-03-28 NOTE — Chronic Care Management (AMB) (Deleted)
Chronic Care Management Pharmacy  Name: Ondine Gemme  MRN: [id number] DOB: June 25, 1933   Chief Complaint/ HPI  Sharon Bullock,  86 y.o. , female presents for their Follow-Up CCM visit with the clinical pharmacist via telephone.  PCP : Steele Sizer, MD  Their chronic conditions include: Hypertension, Hyperlipidemia, Atrial Fibrillation, Heart Failure, GERD, Depression, Osteopenia, and Allergic Rhinitis  Office Visits:  03/22/20: Patient presented to Dr. Ancil Boozer for follow-up. No medication changes made. 02/16/20: Patient presented to Clemetine Marker, LPN for AWV    Consult Visit: NA  Medications: Outpatient Encounter Medications as of 03/28/2020  Medication Sig  . amiodarone (PACERONE) 200 MG tablet Take 1 tablet by mouth daily.  Marland Kitchen amLODipine (NORVASC) 10 MG tablet Take 1 tablet (10 mg total) by mouth daily.  . carvedilol (COREG) 25 MG tablet TAKE 1 TABLET(25 MG) BY MOUTH TWICE DAILY WITH A MEAL  . dabigatran (PRADAXA) 150 MG CAPS capsule Take 150 mg by mouth 2 (two) times daily.  . ferrous gluconate (FERGON) 324 MG tablet Take 324 mg by mouth daily with breakfast.  . folic acid (FOLVITE) 272 MCG tablet Take 400 mcg by mouth daily.   . irbesartan (AVAPRO) 300 MG tablet Take 1 tablet (300 mg total) by mouth daily.  Marland Kitchen levocetirizine (XYZAL) 5 MG tablet TAKE 1 TABLET(5 MG) BY MOUTH EVERY EVENING  . omeprazole (PRILOSEC) 40 MG capsule Take 1 capsule (40 mg total) by mouth daily.  . rosuvastatin (CRESTOR) 10 MG tablet Take 1 tablet (10 mg total) by mouth daily.  . solifenacin (VESICARE) 5 MG tablet Take 1 tablet (5 mg total) by mouth daily.  Marland Kitchen spironolactone (ALDACTONE) 25 MG tablet Take 1 tablet by mouth daily.  . Vitamin D, Cholecalciferol, 1000 UNITS TABS Take 1,000 Units by mouth daily.   Marland Kitchen zolpidem (AMBIEN) 5 MG tablet Take 1 tablet (5 mg total) by mouth at bedtime.   Facility-Administered Encounter Medications as of 03/28/2020  Medication  . cyanocobalamin ((VITAMIN B-12))  injection 1,000 mcg    Financial Resource Strain: High Risk  . Difficulty of Paying Living Expenses: Very hard    Current Diagnosis/Assessment:  Goals Addressed   None    Hypertension   BP goal is:  {CHL HP UPSTREAM Pharmacist BP ranges:[phone]}  Office blood pressures are  BP Readings from Last 3 Encounters:  03/22/20 132/70  12/26/19 140/62  08/25/19 136/68   Patient checks BP at home {CHL HP BP Monitoring [phone]} Patient home BP readings are ranging: ***  Patient has failed these meds in the past: *** Patient is currently {CHL Controlled/Uncontrolled:[phone]} on the following medications:  . Amlodipine 10 mg daily  . Carvedilol 25 mg twice daily  . Spironolactone 25 mg daily  . Irbesartan 300 mg daily   We discussed {CHL HP Upstream Pharmacy discussion:[phone]}  Plan  Continue {CHL HP Upstream Pharmacy [phone]}       Heart Failure   Managed by Dr. Mina Marble ([facility])  Type: Diastolic  Last ejection fraction: NA NYHA Class: I (no actitivty limitation) AHA HF Stage: B (Heart disease present - no symptoms present)  Patient has failed these meds in past:  Patient is currently controlled on the following medications:  medications:  . Amlodipine 10 mg daily  . Carvedilol 25 mg twice daily  . Spironolactone 25 mg daily  . Irbesartan 300 mg daily    We discussed: Issues with previous medications including blurry vision No blurry eyes with Pradaxa  Plan  Continue current medications  AFIB   Patient  is currently {CHL HP BP RATE/[phone]} controlled. HR *** BPM  Patient has failed these meds in past: *** Patient is currently {CHL Controlled/Uncontrolled:[phone]} on the following medications: *** medications:  . Amiodarone 200 mg daily  . Carvedilol 25 mg twice daily  . Pradaxa 150 mg twice daily    We discussed:  {CHL HP Upstream Pharmacy discussion:[phone]}  Plan  Continue {CHL HP Upstream Pharmacy  Plans:[phone]}  Hyperlipidemia   LDL goal < 100  Lipid Panel     Component Value Date/Time   CHOL 192 12/26/2019 1149   CHOL 192 08/17/2015 1115   TRIG 51 12/26/2019 1149   HDL 76 12/26/2019 1149   HDL 96 08/17/2015 1115   LDLCALC 103 (H) 12/26/2019 1149    Hepatic Function Latest Ref Rng & Units 12/26/2019 05/06/2019 01/25/2019  Total Protein 6.1 - 8.1 g/dL 7.5 6.6 7.0  Albumin 3.5 - 5.0 g/dL - - -  AST 10 - 35 U/L '22 22 22  ' ALT 6 - 29 U/L '15 17 20  ' Alk Phosphatase 38 - 126 U/L - - -  Total Bilirubin 0.2 - 1.2 mg/dL 0.4 0.5 0.4     The ASCVD Risk score (Tubac., et al., 2013) failed to calculate for the following reasons:   The 2013 ASCVD risk score is only valid for ages 97 to 47   Patient has failed these meds in past: NA Patient is currently controlled on the following medications:  . Rosuvastatin 4m daily  We discussed:   At goal Denies myalgias  Plan  Continue current medications  Allergic Rhinitis   Patient has failed these meds in past: *** Patient is currently {CHL Controlled/Uncontrolled:[phone]} on the following medications:  . Levocetirizine 5 mg QHS   We discussed:  ***  Plan  Continue {CHL HP Upstream Pharmacy Plans:[phone]}  Osteopenia / Osteoporosis   Last DEXA Scan: ***   T-Score femoral neck: ***  T-Score total hip: ***  T-Score lumbar spine: ***  T-Score forearm radius: ***  10-year probability of major osteoporotic fracture: ***  10-year probability of hip fracture: ***  Vit D, 25-Hydroxy  Date Value Ref Range Status  05/06/2019 51 30 - 100 ng/mL Final    Comment:    Vitamin D Status         25-OH Vitamin D: . Deficiency:                    <20 ng/mL Insufficiency:             20 - 29 ng/mL Optimal:                 > or = 30 ng/mL . For 25-OH Vitamin D testing on patients on  D2-supplementation and patients for whom quantitation  of D2 and D3 fractions is required, the [facility](TM) 25-OH VIT D, (D2,D3),  LC/MS/MS is recommended: order  code [phone](patients >226yr. See Note 1 . Note 1 . For additional information, please refer to  http://education.[facility].com/faq/FAQ199  (This link is being provided for informational/ educational purposes only.)      Patient {is;is not an osteoporosis candidate:23886}  Patient has failed these meds in past: *** Patient is currently {CHL Controlled/Uncontrolled:[phone]} on the following medications:  . Marland Kitchenitamin D 1000 units daily   We discussed:  {Osteoporosis Counseling:23892}  Plan  Continue {CHL HP Upstream Pharmacy Plans:[phone]}  GERD   Patient {Actions; denies-reports:120008::"denies"} {gerd assoc sx:31969:o:"dysphagia","heartburn","nausea"}. Expresses understanding to avoid triggers such as {causes; exacerbators GERD:13199}.  Currently {  CHL Controlled/[phone]} on: . Omeprazole 40 mg daily   Plan   {rxplan:23810::"Continue current medication."}  Insomnia   Patient has failed these meds in past: *** Patient is currently {CHL Controlled/[phone]} on the following medications:  Marland Kitchen Zolpidem 5 mg QHS   We discussed:  ***  Plan  Continue {CHL HP Upstream Pharmacy Plans:[id number]}   Misc / OTC    Patient has failed these meds in past: *** Patient is currently {CHL Controlled/[phone]} on the following medications:  . Ferrous gluconate 325 mg daily  . Folic acid 891 mg daily  . Solifenacin 5 mg daily   We discussed:  ***  Plan  Continue {CHL HP Upstream Pharmacy QXIHW:[id number]}   Medication Management   Pt uses [facility] for all medications Uses pill box? Yes Pt endorses 100% compliance  We discussed:   Plan  Continue current medication management strategy  Follow up: 3 month phone visit  [facility] [phone]

## 2020-03-30 ENCOUNTER — Other Ambulatory Visit: Payer: Self-pay | Admitting: Family Medicine

## 2020-03-30 DIAGNOSIS — K219 Gastro-esophageal reflux disease without esophagitis: Secondary | ICD-10-CM

## 2020-04-10 ENCOUNTER — Encounter: Payer: Self-pay | Admitting: Intensive Care

## 2020-04-10 ENCOUNTER — Other Ambulatory Visit: Payer: Self-pay

## 2020-04-10 ENCOUNTER — Emergency Department: Payer: Medicare Other

## 2020-04-10 ENCOUNTER — Emergency Department
Admission: EM | Admit: 2020-04-10 | Discharge: 2020-04-10 | Disposition: A | Discharge status: Home / Self Care | Payer: Medicare Other | Attending: Emergency Medicine | Admitting: Emergency Medicine

## 2020-04-10 DIAGNOSIS — I509 Heart failure, unspecified: Secondary | ICD-10-CM | POA: Insufficient documentation

## 2020-04-10 DIAGNOSIS — R0602 Shortness of breath: Secondary | ICD-10-CM | POA: Insufficient documentation

## 2020-04-10 DIAGNOSIS — J90 Pleural effusion, not elsewhere classified: Secondary | ICD-10-CM | POA: Diagnosis not present

## 2020-04-10 DIAGNOSIS — J9811 Atelectasis: Secondary | ICD-10-CM | POA: Diagnosis not present

## 2020-04-10 DIAGNOSIS — Z79899 Other long term (current) drug therapy: Secondary | ICD-10-CM | POA: Insufficient documentation

## 2020-04-10 DIAGNOSIS — I517 Cardiomegaly: Secondary | ICD-10-CM | POA: Diagnosis not present

## 2020-04-10 DIAGNOSIS — I110 Hypertensive heart disease with heart failure: Secondary | ICD-10-CM | POA: Diagnosis not present

## 2020-04-10 HISTORY — DX: Heart failure, unspecified: I50.9

## 2020-04-10 LAB — BASIC METABOLIC PANEL
Anion gap: 8 (ref 5–15)
BUN: 25 mg/dL — ABNORMAL HIGH (ref 8–23)
CO2: 22 mmol/L (ref 22–32)
Calcium: 9.1 mg/dL (ref 8.9–10.3)
Chloride: 109 mmol/L (ref 98–111)
Creatinine, Ser: 1.09 mg/dL — ABNORMAL HIGH (ref 0.44–1.00)
GFR, Estimated: 50 mL/min — ABNORMAL LOW (ref >60)
Glucose, Bld: 100 mg/dL — ABNORMAL HIGH (ref 70–99)
Potassium: 3.7 mmol/L (ref 3.5–5.1)
Sodium: 139 mmol/L (ref 135–145)

## 2020-04-10 LAB — CBC
HCT: 34 % — ABNORMAL LOW (ref 36.0–46.0)
Hemoglobin: 11.1 g/dL — ABNORMAL LOW (ref 12.0–15.0)
MCH: 31.3 pg (ref 26.0–34.0)
MCHC: 32.6 g/dL (ref 30.0–36.0)
MCV: 95.8 fL (ref 80.0–100.0)
Platelets: 144 10*3/uL — ABNORMAL LOW (ref 150–400)
RBC: 3.55 MIL/uL — ABNORMAL LOW (ref 3.87–5.11)
RDW: 14.8 % (ref 11.5–15.5)
WBC: 5.7 10*3/uL (ref 4.0–10.5)
nRBC: 0 % (ref 0.0–0.2)

## 2020-04-10 LAB — PROTIME-INR
INR: 1.7 — ABNORMAL HIGH (ref 0.8–1.2)
Prothrombin Time: 19 seconds — ABNORMAL HIGH (ref 11.4–15.2)

## 2020-04-10 LAB — TROPONIN I (HIGH SENSITIVITY): Troponin I (High Sensitivity): 42 ng/L — ABNORMAL HIGH (ref <18)

## 2020-04-10 MED ORDER — IPRATROPIUM-ALBUTEROL 0.5-2.5 (3) MG/3ML IN SOLN
3.0000 mL | Freq: Once | RESPIRATORY_TRACT | Status: AC
  Administered 2020-04-10: 16:00:00 3 mL via RESPIRATORY_TRACT

## 2020-04-10 MED ORDER — PREDNISONE 20 MG PO TABS: 50.0000 mg | ORAL_TABLET | Freq: Every day | ORAL | 0 refills | Status: DC

## 2020-04-10 MED ORDER — IOHEXOL 350 MG/ML SOLN
75.0000 mL | Freq: Once | INTRAVENOUS | Status: AC | PRN
  Administered 2020-04-10: 14:00:00 75 mL via INTRAVENOUS

## 2020-04-10 MED ORDER — PREDNISONE 20 MG PO TABS: 50.0000 mg | ORAL_TABLET | Freq: Once | ORAL | Status: DC

## 2020-04-10 NOTE — ED Provider Notes (Signed)
[facility] Emergency Department Provider Note   ____________________________________________    I have reviewed the triage vital signs and the nursing notes.   HISTORY  Chief Complaint Chest tightness and shortness of breath   HPI Sharon Bullock is a 86 y.o. female with history of CHF, paroxysmal A. fib, mitral regurgitation who presents with complaints of shortness of breath.  She reports she has been feeling mildly short of breath for the last 3 weeks.  She reports compliance with her medications.  Denies fevers chills or cough.  Feels that air is not flowing properly through her lungs.  No calf pain, does get occasional swelling in her feet.  Has not taken anything for this.  No sick contacts  Past Medical History:  Diagnosis Date  . Allergy   . CHF (congestive heart failure) (HCC)   . Enlarged heart 2000  . GERD (gastroesophageal reflux disease)   . Hypertension   . Insomnia   . Vertigo 2006    Patient Active Problem List   Diagnosis Date Noted  . Pericardial effusion 04/18/2019  . Moderate major depression, single episode (HCC) 08/03/2018  . Thrombocytopenia (HCC) 05/27/2016  . CHF NYHA class I, chronic, diastolic (HCC) 05/26/2016  . Mesenteric vascular insufficiency (HCC) 02/28/2016  . Pancreas cyst 02/28/2016  . Increased endometrial stripe thickness 02/28/2016  . Carotid atherosclerosis, bilateral 01/23/2016  . Hearing loss 08/17/2015  . Mitral regurgitation 04/19/2015  . Pernicious anemia 09/27/2014  . Paroxysmal A-fib (HCC) 09/27/2014  . Benign essential HTN 09/27/2014  . Perennial allergic rhinitis 09/27/2014  . Chronic anemia 09/27/2014  . Insomnia, persistent 09/27/2014  . Hypertensive pulmonary vascular disease (HCC) 09/27/2014  . B12 deficiency 09/27/2014  . Diverticulosis of colon 09/27/2014  . Dyslipidemia 09/27/2014  . Edema extremities 09/27/2014  . Gastric reflux 09/27/2014  . Bilateral hearing loss 09/27/2014  .  Polypharmacy 09/27/2014  . Pelvic pain in female 09/27/2014  . MI (mitral incompetence) 09/27/2014  . Internal hemorrhoids 09/27/2014  . Osteopenia 09/27/2014  . Arthralgia of shoulder 09/27/2014  . Finger tendinitis 09/27/2014  . Urge incontinence 09/27/2014  . Cervical prolapse 09/27/2014  . Vertigo 09/27/2014  . Vitamin D deficiency 09/27/2014  . Bladder cystocele 04/15/2012  . Asymmetric septal hypertrophy (HCC) 05/19/2011  . Hypertrophic cardiomyopathy (HCC) 05/19/2011    Past Surgical History:  Procedure Laterality Date  . BLADDER REPAIR  2014   tact    Prior to Admission medications   Medication Sig Start Date End Date Taking? Authorizing Provider  predniSONE (DELTASONE) 20 MG tablet Take 2.5 tablets (50 mg total) by mouth daily with breakfast. 04/10/20  Yes Jene Every, MD  amiodarone (PACERONE) 200 MG tablet Take 1 tablet by mouth daily. 07/13/14   [provider]  amLODipine (NORVASC) 10 MG tablet Take 1 tablet (10 mg total) by mouth daily. 12/26/19   Alba Cory, MD  carvedilol (COREG) 25 MG tablet TAKE 1 TABLET(25 MG) BY MOUTH TWICE DAILY WITH A MEAL 02/03/20   Carlynn Purl, Danna Hefty, MD  dabigatran (PRADAXA) 150 MG CAPS capsule Take 150 mg by mouth 2 (two) times daily.    Nolon Rod, MD  ferrous gluconate (FERGON) 324 MG tablet Take 324 mg by mouth daily with breakfast.    [provider]  folic acid (FOLVITE) 400 MCG tablet Take 400 mcg by mouth daily.     [provider]  irbesartan (AVAPRO) 300 MG tablet Take 1 tablet (300 mg total) by mouth daily. 03/22/20   Sowles, Danna Hefty,  MD  levocetirizine (XYZAL) 5 MG tablet TAKE 1 TABLET(5 MG) BY MOUTH EVERY EVENING 02/28/20   Carlynn Purl, Danna Hefty, MD  omeprazole (PRILOSEC) 40 MG capsule TAKE 1 CAPSULE(40 MG) BY MOUTH DAILY 03/30/20   Carlynn Purl, Danna Hefty, MD  rosuvastatin (CRESTOR) 10 MG tablet Take 1 tablet (10 mg total) by mouth daily. 12/26/19   Alba Cory, MD  solifenacin (VESICARE) 5 MG tablet Take 1  tablet (5 mg total) by mouth daily. 11/05/18   Alba Cory, MD  spironolactone (ALDACTONE) 25 MG tablet Take 1 tablet by mouth daily. 01/10/19   Nolon Rod, MD  Vitamin D, Cholecalciferol, 1000 UNITS TABS Take 1,000 Units by mouth daily.     [provider]  zolpidem (AMBIEN) 5 MG tablet Take 1 tablet (5 mg total) by mouth at bedtime. 03/22/20   Alba Cory, MD     Allergies Nickel, Other, Ace inhibitors, and Xarelto [rivaroxaban]  Family History  Problem Relation Age of Onset  . Alzheimer's disease Mother   . Hypertension Mother   . Stroke Father   . Heart disease Father   . Heart disease Brother   . Hypertension Brother   . Cancer Daughter        breast  . Hypertension Daughter   . Breast cancer Daughter   . Diabetes Brother   . Heart disease Brother   . Heart disease Brother   . Diabetes Brother   . Hypertension Brother   . Heart disease Brother   . Hypertension Brother   . Cancer Brother        prostate  . Diabetes Brother   . Heart disease Brother   . Hypertension Brother   . Cancer Brother        prostate    Social History Social History   Tobacco Use  . Smoking status: Never Smoker  . Smokeless tobacco: Never Used  . Tobacco comment: smoking cessation materials not required  Vaping Use  . Vaping Use: Never used  Substance Use Topics  . Alcohol use: No    Alcohol/week: 0.0 standard drinks  . Drug use: No    Review of Systems  Constitutional: No fever/chills Eyes: No visual changes.  ENT: No sore throat. Cardiovascular: Denies chest pain. Respiratory: As above Gastrointestinal: No abdominal pain.  No nausea, no vomiting.   Genitourinary: Negative for dysuria. Musculoskeletal: Negative for back pain. Skin: Negative for rash. Neurological: Negative for headaches    ____________________________________________   PHYSICAL EXAM:  VITAL SIGNS: ED Triage Vitals  Enc Vitals Group     BP 04/10/20 0953 (!) 154/63     Pulse Rate  04/10/20 0953 (!) 57     Resp 04/10/20 0953 18     Temp 04/10/20 0953 98.5 F (36.9 C)     Temp Source 04/10/20 0953 Oral     SpO2 04/10/20 0953 96 %     Weight 04/10/20 0954 67.1 kg (148 lb)     Height 04/10/20 0954 1.626 m (5\' 4" )     Head Circumference --      Peak Flow --      Pain Score 04/10/20 0953 7     Pain Loc --      Pain Edu? --      Excl. in GC? --     Constitutional: Alert and oriented.  Nose: No congestion/rhinnorhea. Mouth/Throat: Mucous membranes are moist.    Cardiovascular: Normal rate, regular rhythm.  3 out of 6 systolic murmur good peripheral circulation. Respiratory: Normal respiratory effort.  No retractions. Lungs CTAB. Gastrointestinal: Soft and nontender. No distention.   Musculoskeletal: No lower extremity tenderness nor edema.  Warm and well perfused Neurologic:  Normal speech and language. No gross focal neurologic deficits are appreciated.  Skin:  Skin is warm, dry and intact. No rash noted. Psychiatric: Mood and affect are normal. Speech and behavior are normal.  ____________________________________________   LABS (all labs ordered are listed, but only abnormal results are displayed)  Labs Reviewed  BASIC METABOLIC PANEL - Abnormal; Notable for the following components:      Result Value   Glucose, Bld 100 (*)    BUN 25 (*)    Creatinine, Ser 1.09 (*)    GFR, Estimated 50 (*)    All other components within normal limits  CBC - Abnormal; Notable for the following components:   RBC 3.55 (*)    Hemoglobin 11.1 (*)    HCT 34.0 (*)    Platelets 144 (*)    All other components within normal limits  PROTIME-INR - Abnormal; Notable for the following components:   Prothrombin Time 19.0 (*)    INR 1.7 (*)    All other components within normal limits  TROPONIN I (HIGH SENSITIVITY) - Abnormal; Notable for the following components:   Troponin I (High Sensitivity) 42 (*)    All other components within normal limits  TROPONIN I (HIGH  SENSITIVITY)   ____________________________________________  EKG  ED ECG REPORT I, Jene Every, the attending physician, personally viewed and interpreted this ECG.  Date: 04/10/2020  Rhythm: normal sinus rhythm QRS Axis: normal Intervals: First-degree AV block ST/T Wave abnormalities: normal Narrative Interpretation: no evidence of acute ischemia  ____________________________________________  RADIOLOGY  Chest x-ray viewed by me, no acute abnormalities CT angiography small bilateral effusions, reviewed by me, no PE ____________________________________________   PROCEDURES  Procedure(s) performed: No  Procedures   Critical Care performed: No ____________________________________________   INITIAL IMPRESSION / ASSESSMENT AND PLAN / ED COURSE  Pertinent labs & imaging results that were available during my care of the patient were reviewed by me and considered in my medical decision making (see chart for details).  Patient presents with shortness of breath over 3 weeks as above.  Overall well-appearing with no increased work of breathing.  Chest x-ray reviewed and is overall reassuring.  I sensitive troponin is chronically elevated  EKG is overall unremarkable.  White blood cell count is normal.  We will send for CT angiography given short of breath for 3 weeks to evaluate for PE  CT scan is reassuring, no evidence of pneumonia or PE.  Small effusions.  Appropriate for outpatient treatment and work-up, possible element of bronchospasm        ____________________________________________   FINAL CLINICAL IMPRESSION(S) / ED DIAGNOSES  Final diagnoses:  Shortness of breath        Note:  This document was prepared using Dragon voice recognition software and may include unintentional dictation errors.   Jene Every, MD 04/10/20 1537

## 2020-04-10 NOTE — ED Triage Notes (Signed)
PAtient c/o sob with chest tightness X3 weeks ago.

## 2020-04-12 ENCOUNTER — Telehealth: Payer: Self-pay

## 2020-04-12 NOTE — Progress Notes (Signed)
Chronic Care Management Pharmacy Assistant   Name: Latronda Spink  MRN: [id number] DOB: Aug 19, 1933  Reason for Encounter: Medication Review   [name],  86 y.o. , female .  PCP : Alba Cory, MD  Allergies:   Allergies  Allergen Reactions  . Nickel Dermatitis and Swelling  . Other Anaphylaxis    Uncoded Allergy. Allergen: SHELLFISH  . Ace Inhibitors Cough  . Xarelto [Rivaroxaban]     Per patient blurred     Medications: Outpatient Encounter Medications as of 04/12/2020  Medication Sig  . amiodarone (PACERONE) 200 MG tablet Take 1 tablet by mouth daily.  Marland Kitchen amLODipine (NORVASC) 10 MG tablet Take 1 tablet (10 mg total) by mouth daily.  . carvedilol (COREG) 25 MG tablet TAKE 1 TABLET(25 MG) BY MOUTH TWICE DAILY WITH A MEAL  . dabigatran (PRADAXA) 150 MG CAPS capsule Take 150 mg by mouth 2 (two) times daily.  . ferrous gluconate (FERGON) 324 MG tablet Take 324 mg by mouth daily with breakfast.  . folic acid (FOLVITE) 400 MCG tablet Take 400 mcg by mouth daily.   . irbesartan (AVAPRO) 300 MG tablet Take 1 tablet (300 mg total) by mouth daily.  Marland Kitchen levocetirizine (XYZAL) 5 MG tablet TAKE 1 TABLET(5 MG) BY MOUTH EVERY EVENING  . omeprazole (PRILOSEC) 40 MG capsule TAKE 1 CAPSULE(40 MG) BY MOUTH DAILY  . predniSONE (DELTASONE) 20 MG tablet Take 2.5 tablets (50 mg total) by mouth daily with breakfast.  . rosuvastatin (CRESTOR) 10 MG tablet Take 1 tablet (10 mg total) by mouth daily.  . solifenacin (VESICARE) 5 MG tablet Take 1 tablet (5 mg total) by mouth daily.  Marland Kitchen spironolactone (ALDACTONE) 25 MG tablet Take 1 tablet by mouth daily.  . Vitamin D, Cholecalciferol, 1000 UNITS TABS Take 1,000 Units by mouth daily.   Marland Kitchen zolpidem (AMBIEN) 5 MG tablet Take 1 tablet (5 mg total) by mouth at bedtime.   Facility-Administered Encounter Medications as of 04/12/2020  Medication  . cyanocobalamin ((VITAMIN B-12)) injection 1,000 mcg    Current Diagnosis: Patient Active Problem List    Diagnosis Date Noted  . Pericardial effusion 04/18/2019  . Moderate major depression, single episode (HCC) 08/03/2018  . Thrombocytopenia (HCC) 05/27/2016  . CHF NYHA class I, chronic, diastolic (HCC) 05/26/2016  . Mesenteric vascular insufficiency (HCC) 02/28/2016  . Pancreas cyst 02/28/2016  . Increased endometrial stripe thickness 02/28/2016  . Carotid atherosclerosis, bilateral 01/23/2016  . Hearing loss 08/17/2015  . Mitral regurgitation 04/19/2015  . Pernicious anemia 09/27/2014  . Paroxysmal A-fib (HCC) 09/27/2014  . Benign essential HTN 09/27/2014  . Perennial allergic rhinitis 09/27/2014  . Chronic anemia 09/27/2014  . Insomnia, persistent 09/27/2014  . Hypertensive pulmonary vascular disease (HCC) 09/27/2014  . B12 deficiency 09/27/2014  . Diverticulosis of colon 09/27/2014  . Dyslipidemia 09/27/2014  . Edema extremities 09/27/2014  . Gastric reflux 09/27/2014  . Bilateral hearing loss 09/27/2014  . Polypharmacy 09/27/2014  . Pelvic pain in female 09/27/2014  . MI (mitral incompetence) 09/27/2014  . Internal hemorrhoids 09/27/2014  . Osteopenia 09/27/2014  . Arthralgia of shoulder 09/27/2014  . Finger tendinitis 09/27/2014  . Urge incontinence 09/27/2014  . Cervical prolapse 09/27/2014  . Vertigo 09/27/2014  . Vitamin D deficiency 09/27/2014  . Bladder cystocele 04/15/2012  . Asymmetric septal hypertrophy (HCC) 05/19/2011  . Hypertrophic cardiomyopathy (HCC) 05/19/2011       Reviewed chart and adherence measures. Per insurance data, [place] Elon Alas PharmD, notified.  Follow-Up:  Pharmacist Review  [facility], [place]   Chronic Care Management Pharmacy Assistant   Name: Yardley Beltran  MRN: [id number] DOB: Jan 13, 1934  Reason for Encounter: Medication Review   [name],  86 y.o. , female .  PCP : Alba Cory, MD  Allergies:   Allergies  Allergen Reactions  . Nickel Dermatitis and Swelling  . Other Anaphylaxis    Uncoded  Allergy. Allergen: SHELLFISH  . Ace Inhibitors Cough  . Xarelto [Rivaroxaban]     Per patient blurred     Medications: Outpatient Encounter Medications as of 04/12/2020  Medication Sig  . amiodarone (PACERONE) 200 MG tablet Take 1 tablet by mouth daily.  Marland Kitchen amLODipine (NORVASC) 10 MG tablet Take 1 tablet (10 mg total) by mouth daily.  . carvedilol (COREG) 25 MG tablet TAKE 1 TABLET(25 MG) BY MOUTH TWICE DAILY WITH A MEAL  . dabigatran (PRADAXA) 150 MG CAPS capsule Take 150 mg by mouth 2 (two) times daily.  . ferrous gluconate (FERGON) 324 MG tablet Take 324 mg by mouth daily with breakfast.  . folic acid (FOLVITE) 400 MCG tablet Take 400 mcg by mouth daily.   . irbesartan (AVAPRO) 300 MG tablet Take 1 tablet (300 mg total) by mouth daily.  Marland Kitchen levocetirizine (XYZAL) 5 MG tablet TAKE 1 TABLET(5 MG) BY MOUTH EVERY EVENING  . omeprazole (PRILOSEC) 40 MG capsule TAKE 1 CAPSULE(40 MG) BY MOUTH DAILY  . predniSONE (DELTASONE) 20 MG tablet Take 2.5 tablets (50 mg total) by mouth daily with breakfast.  . rosuvastatin (CRESTOR) 10 MG tablet Take 1 tablet (10 mg total) by mouth daily.  . solifenacin (VESICARE) 5 MG tablet Take 1 tablet (5 mg total) by mouth daily.  Marland Kitchen spironolactone (ALDACTONE) 25 MG tablet Take 1 tablet by mouth daily.  . Vitamin D, Cholecalciferol, 1000 UNITS TABS Take 1,000 Units by mouth daily.   Marland Kitchen zolpidem (AMBIEN) 5 MG tablet Take 1 tablet (5 mg total) by mouth at bedtime.   Facility-Administered Encounter Medications as of 04/12/2020  Medication  . cyanocobalamin ((VITAMIN B-12)) injection 1,000 mcg    Current Diagnosis: Patient Active Problem List   Diagnosis Date Noted  . Pericardial effusion 04/18/2019  . Moderate major depression, single episode (HCC) 08/03/2018  . Thrombocytopenia (HCC) 05/27/2016  . CHF NYHA class I, chronic, diastolic (HCC) 05/26/2016  . Mesenteric vascular insufficiency (HCC) 02/28/2016  . Pancreas cyst 02/28/2016  . Increased endometrial  stripe thickness 02/28/2016  . Carotid atherosclerosis, bilateral 01/23/2016  . Hearing loss 08/17/2015  . Mitral regurgitation 04/19/2015  . Pernicious anemia 09/27/2014  . Paroxysmal A-fib (HCC) 09/27/2014  . Benign essential HTN 09/27/2014  . Perennial allergic rhinitis 09/27/2014  . Chronic anemia 09/27/2014  . Insomnia, persistent 09/27/2014  . Hypertensive pulmonary vascular disease (HCC) 09/27/2014  . B12 deficiency 09/27/2014  . Diverticulosis of colon 09/27/2014  . Dyslipidemia 09/27/2014  . Edema extremities 09/27/2014  . Gastric reflux 09/27/2014  . Bilateral hearing loss 09/27/2014  . Polypharmacy 09/27/2014  . Pelvic pain in female 09/27/2014  . MI (mitral incompetence) 09/27/2014  . Internal hemorrhoids 09/27/2014  . Osteopenia 09/27/2014  . Arthralgia of shoulder 09/27/2014  . Finger tendinitis 09/27/2014  . Urge incontinence 09/27/2014  . Cervical prolapse 09/27/2014  . Vertigo 09/27/2014  . Vitamin D deficiency 09/27/2014  . Bladder cystocele 04/15/2012  . Asymmetric septal hypertrophy (HCC) 05/19/2011  . Hypertrophic cardiomyopathy (HCC) 05/19/2011       Reviewed chart and adherence measures. Per insurance data, [place] no medication adherence data available. [employer]  [place] GAPS measures missing.   Angelena Sole PharmD, notified.  Follow-Up:  Pharmacist Review   [facility] CMA, [place]

## 2020-04-18 DIAGNOSIS — Z23 Encounter for immunization: Secondary | ICD-10-CM | POA: Diagnosis not present

## 2020-05-13 ENCOUNTER — Inpatient Hospital Stay
Admission: EM | Admit: 2020-05-13 | Discharge: 2020-05-15 | DRG: 291 | Disposition: A | Discharge status: Home Health | Payer: Medicare Other | Attending: Internal Medicine | Admitting: Internal Medicine

## 2020-05-13 ENCOUNTER — Other Ambulatory Visit: Payer: Self-pay

## 2020-05-13 ENCOUNTER — Emergency Department: Payer: Medicare Other

## 2020-05-13 DIAGNOSIS — J9690 Respiratory failure, unspecified, unspecified whether with hypoxia or hypercapnia: Secondary | ICD-10-CM | POA: Diagnosis not present

## 2020-05-13 DIAGNOSIS — I480 Paroxysmal atrial fibrillation: Secondary | ICD-10-CM | POA: Diagnosis present

## 2020-05-13 DIAGNOSIS — I10 Essential (primary) hypertension: Secondary | ICD-10-CM | POA: Diagnosis present

## 2020-05-13 DIAGNOSIS — I509 Heart failure, unspecified: Secondary | ICD-10-CM | POA: Diagnosis not present

## 2020-05-13 DIAGNOSIS — Z7952 Long term (current) use of systemic steroids: Secondary | ICD-10-CM

## 2020-05-13 DIAGNOSIS — N1831 Chronic kidney disease, stage 3a: Secondary | ICD-10-CM | POA: Diagnosis present

## 2020-05-13 DIAGNOSIS — I130 Hypertensive heart and chronic kidney disease with heart failure and stage 1 through stage 4 chronic kidney disease, or unspecified chronic kidney disease: Principal | ICD-10-CM | POA: Diagnosis present

## 2020-05-13 DIAGNOSIS — R790 Abnormal level of blood mineral: Secondary | ICD-10-CM | POA: Diagnosis not present

## 2020-05-13 DIAGNOSIS — E538 Deficiency of other specified B group vitamins: Secondary | ICD-10-CM

## 2020-05-13 DIAGNOSIS — J810 Acute pulmonary edema: Secondary | ICD-10-CM | POA: Diagnosis not present

## 2020-05-13 DIAGNOSIS — E440 Moderate protein-calorie malnutrition: Secondary | ICD-10-CM | POA: Insufficient documentation

## 2020-05-13 DIAGNOSIS — R778 Other specified abnormalities of plasma proteins: Secondary | ICD-10-CM | POA: Diagnosis not present

## 2020-05-13 DIAGNOSIS — Z888 Allergy status to other drugs, medicaments and biological substances status: Secondary | ICD-10-CM

## 2020-05-13 DIAGNOSIS — D509 Iron deficiency anemia, unspecified: Secondary | ICD-10-CM | POA: Diagnosis present

## 2020-05-13 DIAGNOSIS — Z91013 Allergy to seafood: Secondary | ICD-10-CM | POA: Diagnosis not present

## 2020-05-13 DIAGNOSIS — J9601 Acute respiratory failure with hypoxia: Secondary | ICD-10-CM | POA: Diagnosis present

## 2020-05-13 DIAGNOSIS — I422 Other hypertrophic cardiomyopathy: Secondary | ICD-10-CM | POA: Diagnosis present

## 2020-05-13 DIAGNOSIS — H9190 Unspecified hearing loss, unspecified ear: Secondary | ICD-10-CM | POA: Diagnosis present

## 2020-05-13 DIAGNOSIS — Z8249 Family history of ischemic heart disease and other diseases of the circulatory system: Secondary | ICD-10-CM | POA: Diagnosis not present

## 2020-05-13 DIAGNOSIS — I340 Nonrheumatic mitral (valve) insufficiency: Secondary | ICD-10-CM | POA: Diagnosis present

## 2020-05-13 DIAGNOSIS — J189 Pneumonia, unspecified organism: Secondary | ICD-10-CM | POA: Diagnosis not present

## 2020-05-13 DIAGNOSIS — I5033 Acute on chronic diastolic (congestive) heart failure: Secondary | ICD-10-CM | POA: Diagnosis present

## 2020-05-13 DIAGNOSIS — J90 Pleural effusion, not elsewhere classified: Secondary | ICD-10-CM | POA: Diagnosis not present

## 2020-05-13 DIAGNOSIS — Z6825 Body mass index (BMI) 25.0-25.9, adult: Secondary | ICD-10-CM | POA: Diagnosis not present

## 2020-05-13 DIAGNOSIS — R7989 Other specified abnormal findings of blood chemistry: Secondary | ICD-10-CM | POA: Diagnosis present

## 2020-05-13 DIAGNOSIS — I421 Obstructive hypertrophic cardiomyopathy: Secondary | ICD-10-CM | POA: Diagnosis present

## 2020-05-13 DIAGNOSIS — R609 Edema, unspecified: Secondary | ICD-10-CM | POA: Diagnosis not present

## 2020-05-13 DIAGNOSIS — F5104 Psychophysiologic insomnia: Secondary | ICD-10-CM | POA: Diagnosis present

## 2020-05-13 DIAGNOSIS — E785 Hyperlipidemia, unspecified: Secondary | ICD-10-CM | POA: Diagnosis present

## 2020-05-13 DIAGNOSIS — Z7901 Long term (current) use of anticoagulants: Secondary | ICD-10-CM

## 2020-05-13 DIAGNOSIS — R0602 Shortness of breath: Secondary | ICD-10-CM | POA: Diagnosis not present

## 2020-05-13 DIAGNOSIS — E876 Hypokalemia: Secondary | ICD-10-CM | POA: Diagnosis present

## 2020-05-13 DIAGNOSIS — Z743 Need for continuous supervision: Secondary | ICD-10-CM | POA: Diagnosis not present

## 2020-05-13 DIAGNOSIS — K219 Gastro-esophageal reflux disease without esophagitis: Secondary | ICD-10-CM | POA: Diagnosis present

## 2020-05-13 DIAGNOSIS — Z9109 Other allergy status, other than to drugs and biological substances: Secondary | ICD-10-CM

## 2020-05-13 DIAGNOSIS — Z79899 Other long term (current) drug therapy: Secondary | ICD-10-CM

## 2020-05-13 DIAGNOSIS — I248 Other forms of acute ischemic heart disease: Secondary | ICD-10-CM | POA: Diagnosis present

## 2020-05-13 DIAGNOSIS — N183 Chronic kidney disease, stage 3 unspecified: Secondary | ICD-10-CM | POA: Diagnosis present

## 2020-05-13 DIAGNOSIS — R0689 Other abnormalities of breathing: Secondary | ICD-10-CM | POA: Diagnosis not present

## 2020-05-13 DIAGNOSIS — Z20822 Contact with and (suspected) exposure to covid-19: Secondary | ICD-10-CM | POA: Diagnosis present

## 2020-05-13 DIAGNOSIS — R069 Unspecified abnormalities of breathing: Secondary | ICD-10-CM | POA: Diagnosis not present

## 2020-05-13 DIAGNOSIS — I517 Cardiomegaly: Secondary | ICD-10-CM

## 2020-05-13 LAB — COMPREHENSIVE METABOLIC PANEL
ALT: 34 U/L (ref 0–44)
AST: 34 U/L (ref 15–41)
Albumin: 3.3 g/dL — ABNORMAL LOW (ref 3.5–5.0)
Alkaline Phosphatase: 61 U/L (ref 38–126)
Anion gap: 8 (ref 5–15)
BUN: 24 mg/dL — ABNORMAL HIGH (ref 8–23)
CO2: 23 mmol/L (ref 22–32)
Calcium: 8.7 mg/dL — ABNORMAL LOW (ref 8.9–10.3)
Chloride: 107 mmol/L (ref 98–111)
Creatinine, Ser: 1.09 mg/dL — ABNORMAL HIGH (ref 0.44–1.00)
GFR, Estimated: 50 mL/min — ABNORMAL LOW (ref >60)
Glucose, Bld: 130 mg/dL — ABNORMAL HIGH (ref 70–99)
Potassium: 4.1 mmol/L (ref 3.5–5.1)
Sodium: 138 mmol/L (ref 135–145)
Total Bilirubin: 0.8 mg/dL (ref 0.3–1.2)
Total Protein: 6.6 g/dL (ref 6.5–8.1)

## 2020-05-13 LAB — CBC WITH DIFFERENTIAL/PLATELET
Abs Immature Granulocytes: 0.02 10*3/uL (ref 0.00–0.07)
Basophils Absolute: 0 10*3/uL (ref 0.0–0.1)
Basophils Relative: 1 %
Eosinophils Absolute: 0.1 10*3/uL (ref 0.0–0.5)
Eosinophils Relative: 2 %
HCT: 33.6 % — ABNORMAL LOW (ref 36.0–46.0)
Hemoglobin: 10.8 g/dL — ABNORMAL LOW (ref 12.0–15.0)
Immature Granulocytes: 0 %
Lymphocytes Relative: 20 %
Lymphs Abs: 1.3 10*3/uL (ref 0.7–4.0)
MCH: 31.4 pg (ref 26.0–34.0)
MCHC: 32.1 g/dL (ref 30.0–36.0)
MCV: 97.7 fL (ref 80.0–100.0)
Monocytes Absolute: 0.6 10*3/uL (ref 0.1–1.0)
Monocytes Relative: 10 %
Neutro Abs: 4.4 10*3/uL (ref 1.7–7.7)
Neutrophils Relative %: 67 %
Platelets: 140 10*3/uL — ABNORMAL LOW (ref 150–400)
RBC: 3.44 MIL/uL — ABNORMAL LOW (ref 3.87–5.11)
RDW: 15.3 % (ref 11.5–15.5)
WBC: 6.5 10*3/uL (ref 4.0–10.5)
nRBC: 0 % (ref 0.0–0.2)

## 2020-05-13 LAB — TROPONIN I (HIGH SENSITIVITY)
Troponin I (High Sensitivity): 41 ng/L — ABNORMAL HIGH (ref <18)
Troponin I (High Sensitivity): 120 ng/L (ref <18)
Troponin I (High Sensitivity): 130 ng/L (ref <18)
Troponin I (High Sensitivity): 122 ng/L (ref <18)
Troponin I (High Sensitivity): 115 ng/L (ref <18)

## 2020-05-13 LAB — SARS CORONAVIRUS 2 BY RT PCR (HOSPITAL ORDER, PERFORMED IN ~~LOC~~ HOSPITAL LAB): SARS Coronavirus 2: NEGATIVE

## 2020-05-13 LAB — PROCALCITONIN: Procalcitonin: <0.1 ng/mL

## 2020-05-13 LAB — BRAIN NATRIURETIC PEPTIDE: B Natriuretic Peptide: 505.4 pg/mL — ABNORMAL HIGH (ref 0.0–100.0)

## 2020-05-13 MED ORDER — CARVEDILOL 25 MG PO TABS
25.0000 mg | ORAL_TABLET | Freq: Two times a day (BID) | ORAL | Status: DC
  Administered 2020-05-14 – 2020-05-15 (×3): 25 mg via ORAL
  Filled 2020-05-13 (×3): qty 1

## 2020-05-13 MED ORDER — SODIUM CHLORIDE 0.9% FLUSH
3.0000 mL | Freq: Two times a day (BID) | INTRAVENOUS | Status: DC
  Administered 2020-05-14 (×2): 3 mL via INTRAVENOUS

## 2020-05-13 MED ORDER — ROSUVASTATIN CALCIUM 10 MG PO TABS
10.0000 mg | ORAL_TABLET | Freq: Every day | ORAL | Status: DC
  Administered 2020-05-14 – 2020-05-15 (×2): 10 mg via ORAL
  Filled 2020-05-13 (×2): qty 1

## 2020-05-13 MED ORDER — LEVOCETIRIZINE DIHYDROCHLORIDE 5 MG PO TABS: 5.0000 mg | ORAL_TABLET | Freq: Every evening | ORAL | Status: DC

## 2020-05-13 MED ORDER — SODIUM CHLORIDE 0.9 % IV SOLN: 250.0000 mL | INTRAVENOUS | Status: DC | PRN

## 2020-05-13 MED ORDER — ONDANSETRON HCL 4 MG PO TABS: 4.0000 mg | ORAL_TABLET | Freq: Four times a day (QID) | ORAL | Status: DC | PRN

## 2020-05-13 MED ORDER — AMIODARONE HCL 200 MG PO TABS
200.0000 mg | ORAL_TABLET | Freq: Every day | ORAL | Status: DC
Start: 2020-05-14 — End: 2020-05-15
  Administered 2020-05-14 – 2020-05-15 (×2): 200 mg via ORAL
  Filled 2020-05-13 (×2): qty 1

## 2020-05-13 MED ORDER — ACETAMINOPHEN 325 MG PO TABS: 650.0000 mg | ORAL_TABLET | Freq: Four times a day (QID) | ORAL | Status: DC | PRN

## 2020-05-13 MED ORDER — FUROSEMIDE 10 MG/ML IJ SOLN
20.0000 mg | Freq: Once | INTRAMUSCULAR | Status: AC
  Administered 2020-05-14: 20 mg via INTRAVENOUS
  Filled 2020-05-13: qty 2

## 2020-05-13 MED ORDER — DM-GUAIFENESIN ER 30-600 MG PO TB12: 1.0000 | ORAL_TABLET | Freq: Two times a day (BID) | ORAL | Status: DC | PRN

## 2020-05-13 MED ORDER — DARIFENACIN HYDROBROMIDE ER 7.5 MG PO TB24
7.5000 mg | ORAL_TABLET | Freq: Every day | ORAL | Status: DC
  Administered 2020-05-14 – 2020-05-15 (×2): 7.5 mg via ORAL
  Filled 2020-05-13 (×2): qty 1

## 2020-05-13 MED ORDER — VITAMIN D3 25 MCG (1000 UNIT) PO TABS
1000.0000 [IU] | ORAL_TABLET | Freq: Every day | ORAL | Status: DC
  Administered 2020-05-14 – 2020-05-15 (×2): 1000 [IU] via ORAL
  Filled 2020-05-13 (×4): qty 1

## 2020-05-13 MED ORDER — CYANOCOBALAMIN 1000 MCG/ML IJ SOLN
1000.0000 ug | INTRAMUSCULAR | Status: DC
  Filled 2020-05-13: qty 1

## 2020-05-13 MED ORDER — FERROUS GLUCONATE 324 (38 FE) MG PO TABS
324.0000 mg | ORAL_TABLET | Freq: Every day | ORAL | Status: DC
  Administered 2020-05-14 – 2020-05-15 (×2): 324 mg via ORAL
  Filled 2020-05-13 (×2): qty 1

## 2020-05-13 MED ORDER — FOLIC ACID 0.5 MG HALF TAB
500.0000 ug | ORAL_TABLET | Freq: Every day | ORAL | Status: DC
  Administered 2020-05-15: 0.5 mg via ORAL
  Filled 2020-05-13 (×2): qty 1

## 2020-05-13 MED ORDER — DABIGATRAN ETEXILATE MESYLATE 150 MG PO CAPS
150.0000 mg | ORAL_CAPSULE | Freq: Two times a day (BID) | ORAL | Status: DC
  Administered 2020-05-14 – 2020-05-15 (×3): 150 mg via ORAL
  Filled 2020-05-13 (×5): qty 1

## 2020-05-13 MED ORDER — IRBESARTAN 150 MG PO TABS
300.0000 mg | ORAL_TABLET | Freq: Every day | ORAL | Status: DC
  Administered 2020-05-14 – 2020-05-15 (×2): 300 mg via ORAL
  Filled 2020-05-13 (×2): qty 2

## 2020-05-13 MED ORDER — PANTOPRAZOLE SODIUM 40 MG PO TBEC
40.0000 mg | DELAYED_RELEASE_TABLET | Freq: Every day | ORAL | Status: DC
  Administered 2020-05-14 – 2020-05-15 (×2): 40 mg via ORAL
  Filled 2020-05-13 (×2): qty 1

## 2020-05-13 MED ORDER — FUROSEMIDE 10 MG/ML IJ SOLN
40.0000 mg | Freq: Once | INTRAMUSCULAR | Status: AC
  Administered 2020-05-13: 40 mg via INTRAVENOUS
  Filled 2020-05-13: qty 4

## 2020-05-13 MED ORDER — ALBUTEROL SULFATE HFA 108 (90 BASE) MCG/ACT IN AERS
2.0000 | INHALATION_SPRAY | RESPIRATORY_TRACT | Status: DC | PRN
  Filled 2020-05-13: qty 6.7

## 2020-05-13 MED ORDER — ZOLPIDEM TARTRATE 5 MG PO TABS
5.0000 mg | ORAL_TABLET | Freq: Every day | ORAL | Status: DC
  Administered 2020-05-14: 5 mg via ORAL
  Filled 2020-05-13: qty 1

## 2020-05-13 MED ORDER — SODIUM CHLORIDE 0.9% FLUSH
3.0000 mL | INTRAVENOUS | Status: DC | PRN
Start: 2020-05-13 — End: 2020-05-15

## 2020-05-13 MED ORDER — HYDRALAZINE HCL 20 MG/ML IJ SOLN: 5.0000 mg | INTRAMUSCULAR | Status: DC | PRN

## 2020-05-13 MED ORDER — AMLODIPINE BESYLATE 10 MG PO TABS
10.0000 mg | ORAL_TABLET | Freq: Every day | ORAL | Status: DC
  Administered 2020-05-14: 10 mg via ORAL
  Filled 2020-05-13: qty 1

## 2020-05-13 NOTE — ED Notes (Signed)
Date and time results received: 05/13/20 0958 (use smartphrase ".now" to insert current time)  Test: Trop Critical Value: 120  Name of Provider Notified: Clyde Lundborg, MD  Orders Received? Or Actions Taken?: Critical Results Acknowledged

## 2020-05-13 NOTE — ED Notes (Signed)
Admitting MD at bedside at this time.

## 2020-05-13 NOTE — H&P (Signed)
History and Physical    Sharon Bullock EAV:[id number] DOB: 11-05-1933 DOA: 05/13/2020  Referring MD/NP/PA:   PCP: Alba Cory, MD   Patient coming from:  The patient is coming from home.    Chief Complaint: SOB  HPI: Sharon Bullock is a 87 y.o. female with medical history significant of dCHF, hypertension, hyperlipidemia, GERD, iron deficiency anemia, CKD stage III, mitral valve regurgitation, insomnia, vertigo, hard of hearing, bilateral carotid artery stenosis, atrial fibrillation on Pradaxa, symmetric septal hypertrophy and hypertrophic cardiomyopathy, who presents with shortness of breath.  Patient states that she started having shortness breath since last night, which has been progressively worsening.  Patient has cough, chest congestion.  No fever, chills or chest pain.  Patient denies nausea, vomiting, diarrhea, abdominal, symptoms of UTI.  No unilateral numbness or tingling in extremities. She was found to have oxygen desaturated to 86% on room air, which improved to 94% on 2 L oxygen.  ED Course: pt was found to have negative Covid PCR, BNP 505, troponin level 41 -->120, renal function stable, temperature normal, blood pressure 102/60, heart rate 56, 102, 97, RR 22, 16, chest x-ray showed pulmonary edema.  Patient is admitted to progressive bed as inpatient. Dr. Juliann Pares of card is consulted.  Review of Systems:   General: no fevers, chills, no body weight gain, has fatigue HEENT: no blurry vision, hearing changes or sore throat Respiratory: has dyspnea, coughing, no wheezing CV: no chest pain, no palpitations GI: no nausea, vomiting, abdominal pain, diarrhea, constipation GU: no dysuria, burning on urination, increased urinary frequency, hematuria  Ext: has leg edema Neuro: no unilateral weakness, numbness, or tingling, no vision change or hearing loss Skin: no rash, no skin tear. MSK: No muscle spasm, no deformity, no limitation of range of movement in spin Heme: No easy  bruising.  Travel history: No recent long distant travel.  Allergy:  Allergies  Allergen Reactions  . Nickel Dermatitis and Swelling  . Other Anaphylaxis    Uncoded Allergy. Allergen: SHELLFISH  . Ace Inhibitors Cough  . Xarelto [Rivaroxaban]     Per patient blurred     Past Medical History:  Diagnosis Date  . Allergy   . CHF (congestive heart failure) (HCC)   . Enlarged heart 2000  . GERD (gastroesophageal reflux disease)   . Hypertension   . Insomnia   . Vertigo 2006    Past Surgical History:  Procedure Laterality Date  . BLADDER REPAIR  2014   tact    Social History:  reports that she has never smoked. She has never used smokeless tobacco. She reports that she does not drink alcohol and does not use drugs.  Family History:  Family History  Problem Relation Age of Onset  . Alzheimer's disease Mother   . Hypertension Mother   . Stroke Father   . Heart disease Father   . Heart disease Brother   . Hypertension Brother   . Cancer Daughter        breast  . Hypertension Daughter   . Breast cancer Daughter   . Diabetes Brother   . Heart disease Brother   . Heart disease Brother   . Diabetes Brother   . Hypertension Brother   . Heart disease Brother   . Hypertension Brother   . Cancer Brother        prostate  . Diabetes Brother   . Heart disease Brother   . Hypertension Brother   . Cancer Brother  prostate     Prior to Admission medications   Medication Sig Start Date End Date Taking? Authorizing Provider  amiodarone (PACERONE) 200 MG tablet Take 1 tablet by mouth daily. 07/13/14   [provider]  amLODipine (NORVASC) 10 MG tablet Take 1 tablet (10 mg total) by mouth daily. 12/26/19   Alba Cory, MD  carvedilol (COREG) 25 MG tablet TAKE 1 TABLET(25 MG) BY MOUTH TWICE DAILY WITH A MEAL 02/03/20   Carlynn Purl, Danna Hefty, MD  dabigatran (PRADAXA) 150 MG CAPS capsule Take 150 mg by mouth 2 (two) times daily.    Nolon Rod, MD  ferrous gluconate  (FERGON) 324 MG tablet Take 324 mg by mouth daily with breakfast.    [provider]  folic acid (FOLVITE) 400 MCG tablet Take 400 mcg by mouth daily.     [provider]  irbesartan (AVAPRO) 300 MG tablet Take 1 tablet (300 mg total) by mouth daily. 03/22/20   Sowles, Danna Hefty, MD  levocetirizine (XYZAL) 5 MG tablet TAKE 1 TABLET(5 MG) BY MOUTH EVERY EVENING 02/28/20   Carlynn Purl, Danna Hefty, MD  omeprazole (PRILOSEC) 40 MG capsule TAKE 1 CAPSULE(40 MG) BY MOUTH DAILY 03/30/20   Carlynn Purl, Danna Hefty, MD  predniSONE (DELTASONE) 20 MG tablet Take 2.5 tablets (50 mg total) by mouth daily with breakfast. 04/10/20   Jene Every, MD  rosuvastatin (CRESTOR) 10 MG tablet Take 1 tablet (10 mg total) by mouth daily. 12/26/19   Alba Cory, MD  solifenacin (VESICARE) 5 MG tablet Take 1 tablet (5 mg total) by mouth daily. 11/05/18   Alba Cory, MD  spironolactone (ALDACTONE) 25 MG tablet Take 1 tablet by mouth daily. 01/10/19   Nolon Rod, MD  Vitamin D, Cholecalciferol, 1000 UNITS TABS Take 1,000 Units by mouth daily.     [provider]  zolpidem (AMBIEN) 5 MG tablet Take 1 tablet (5 mg total) by mouth at bedtime. 03/22/20   Alba Cory, MD    Physical Exam: Vitals:   05/13/20 0930 05/13/20 0933 05/13/20 1000 05/13/20 1030  BP: 102/60  (!) 103/58 100/64  Pulse: (!) 101 99 97 100  Resp: 20 16 17 19   Temp:      TempSrc:      SpO2: (!) 89% 94% 95% 96%  Weight:      Height:       General: Not in acute distress HEENT:       Eyes: PERRL, EOMI, no scleral icterus.       ENT: No discharge from the ears and nose, no pharynx injection, no tonsillar enlargement.        Neck: positive JVD, no bruit, no mass felt. Heme: No neck lymph node enlargement. Cardiac: S1/S2, RRR, has 2/6 systolic murmurs, No gallops or rubs. Respiratory: has crackles bilaterally GI: Soft, nondistended, nontender, no rebound pain, no organomegaly, BS present. GU: No hematuria Ext: has 1+ pitting leg  edema bilaterally. 1+DP/PT pulse bilaterally. Musculoskeletal: No joint deformities, No joint redness or warmth, no limitation of ROM in spin. Skin: No rashes.  Neuro: Alert, oriented X3, cranial nerves II-XII grossly intact, moves all extremities normally. Psych: Patient is not psychotic, no suicidal or hemocidal ideation.  Labs on Admission: I have personally reviewed following labs and imaging studies  CBC: Recent Labs  Lab 05/13/20 0637  WBC 6.5  NEUTROABS 4.4  HGB 10.8*  HCT 33.6*  MCV 97.7  PLT 140*   Basic Metabolic Panel: Recent Labs  Lab 05/13/20 0637  NA 138  K 4.1  CL 107  CO2 23  GLUCOSE 130*  BUN 24*  CREATININE 1.09*  CALCIUM 8.7*   GFR: Estimated Creatinine Clearance: 36.2 mL/min (A) (by C-G formula based on SCr of 1.09 mg/dL (H)). Liver Function Tests: Recent Labs  Lab 05/13/20 0637  AST 34  ALT 34  ALKPHOS 61  BILITOT 0.8  PROT 6.6  ALBUMIN 3.3*   No results for input(s): LIPASE, AMYLASE in the last 168 hours. No results for input(s): AMMONIA in the last 168 hours. Coagulation Profile: No results for input(s): INR, PROTIME in the last 168 hours. Cardiac Enzymes: No results for input(s): CKTOTAL, CKMB, CKMBINDEX, TROPONINI in the last 168 hours. BNP (last 3 results) No results for input(s): PROBNP in the last 8760 hours. HbA1C: No results for input(s): HGBA1C in the last 72 hours. CBG: No results for input(s): GLUCAP in the last 168 hours. Lipid Profile: No results for input(s): CHOL, HDL, LDLCALC, TRIG, CHOLHDL, LDLDIRECT in the last 72 hours. Thyroid Function Tests: No results for input(s): TSH, T4TOTAL, FREET4, T3FREE, THYROIDAB in the last 72 hours. Anemia Panel: No results for input(s): VITAMINB12, FOLATE, FERRITIN, TIBC, IRON, RETICCTPCT in the last 72 hours. Urine analysis:    Component Value Date/Time   COLORURINE YELLOW (A) 11/30/2018 0727   APPEARANCEUR HAZY (A) 11/30/2018 0727   LABSPEC 1.012 11/30/2018 0727   PHURINE 6.0  11/30/2018 0727   GLUCOSEU NEGATIVE 11/30/2018 0727   HGBUR NEGATIVE 11/30/2018 0727   BILIRUBINUR NEGATIVE 11/30/2018 0727   KETONESUR NEGATIVE 11/30/2018 0727   PROTEINUR NEGATIVE 11/30/2018 0727   NITRITE NEGATIVE 11/30/2018 0727   LEUKOCYTESUR NEGATIVE 11/30/2018 0727   Sepsis Labs: @LABRCNTIP (procalcitonin:4,lacticidven:4) ) Recent Results (from the past 240 hour(s))  SARS Coronavirus 2 by RT PCR (hospital order, performed in [facility] hospital lab) Nasopharyngeal Nasopharyngeal Swab     Status: None   Collection Time: 05/13/20  6:37 AM   Specimen: Nasopharyngeal Swab  Result Value Ref Range Status   SARS Coronavirus 2 NEGATIVE NEGATIVE Final    Comment: (NOTE) SARS-CoV-2 target nucleic acids are NOT DETECTED.  The SARS-CoV-2 RNA is generally detectable in upper and lower respiratory specimens during the acute phase of infection. The lowest concentration of SARS-CoV-2 viral copies this assay can detect is 250 copies / mL. A negative result does not preclude SARS-CoV-2 infection and should not be used as the sole basis for treatment or other patient management decisions.  A negative result may occur with improper specimen collection / handling, submission of specimen other than nasopharyngeal swab, presence of viral mutation(s) within the areas targeted by this assay, and inadequate number of viral copies (<250 copies / mL). A negative result must be combined with clinical observations, patient history, and epidemiological information.  Fact Sheet for Patients:   05/15/20  Fact Sheet for Healthcare Providers: BoilerBrush.com.cy  This test is not yet approved or  cleared by the https://pope.com/ FDA and has been authorized for detection and/or diagnosis of SARS-CoV-2 by FDA under an Emergency Use Authorization (EUA).  This EUA will remain in effect (meaning this test can be used) for the duration of the COVID-19  declaration under Section 564(b)(1) of the Act, 21 U.S.C. section 360bbb-3(b)(1), unless the authorization is terminated or revoked sooner.  Performed at [facility], [place], [address], [place], [place]      Radiological Exams on Admission: DG Chest 2 View  Result Date: 05/13/2020 CLINICAL DATA:  Shortness of breath EXAM: CHEST - 2 VIEW COMPARISON:  04/10/2020 FINDINGS: Chronic cardiopericardial enlargement with  small to moderate pericardial effusion on recent chest CT. Hazy bilateral chest opacity with prominent interstitial markings. Small pleural effusions suggested on the lateral view. IMPRESSION: CHF pattern. If pneumonia were superimposed it would be obscured. Electronically Signed   By: Marnee Spring M.D.   On: 05/13/2020 07:49     EKG: I have personally reviewed.  Sinus rhythm, QTC 498, possible left atrial enlargement.  Assessment/Plan Principal Problem:   Acute on chronic diastolic CHF (congestive heart failure) (HCC) Active Problems:   Paroxysmal A-fib (HCC)   Benign essential HTN   Iron deficiency anemia   Dyslipidemia   Gastric reflux   Asymmetric septal hypertrophy (HCC)   Acute respiratory failure with hypoxia (HCC)   CKD (chronic kidney disease), stage IIIa   Elevated troponin  Acute respiratory failure with hypoxia due to  acute on chronic diastolic CHF: 2D echo 11/30/2018 showed EF > 60%.  Patient has 1+ leg edema, positive JVD, crackles on auscultation, elevated BNP 505, pulmonary edema chest x-ray, clinically consistent with CHF exacerbation.  Patient has history of asymmetric septal hypertrophic cardiomyopathy, will need to be carefully diurese patient, avoid overdiuresis. Dr. Juliann Pares of card is consulted.  -Will admit to progressive unit as inpatient -Lasix 40 mg IV was given in ED, will not given more IV lasix today -will start 20 mg of IV lasix tomorrow -2d echo -Daily weights -strict I/O's -Low salt diet -Fluid restriction -Obtain  REDs Vest reading  Asymmetric septal hypertrophy -Cautiously diurese patient  Paroxysmal A-fib (HCC): -Amiodarone, Coreg -Continue Pradaxa  Benign essential HTN -Amlodipine, Coreg, irbesartan -Patient is on IV Lasix -IV hydralazine as needed  Iron deficiency anemia: Hemoglobin 10.8 (hgb 11.1 on 04/10/20) -Continue iron supplement  Dyslipidemia -Crestor  Gastric reflux -Protonix  CKD (chronic kidney disease), stage IIIa: Stable.  Creatinine 1.09, BUN 24 -Follow-up with BMP  Elevated troponin: Troponin 41 --> 120.  No chest pain.  Possibly due to demand ischemia.  Dr. Juliann Pares of cardiology is consulted -Coreg, Crestor -Not start aspirin since patient is on Pradaxa -Trend troponin -Check A1c, FLP    DVT ppx: On Pradaxa Code Status: Full code Family Communication: Yes, patient's daughter at bed side Disposition Plan:  Anticipate discharge back to previous environment Consults called: Dr. Juliann Pares of card Admission status and Level of care: Progressive Cardiac as inpt    Status is: Inpatient  Remains inpatient appropriate because:Inpatient level of care appropriate due to severity of illness.  Patient has multiple comorbidities, now presents with acute respiratory failure with new oxygen requirement due to CHF exacerbation.  Patient also has elevated troponin.  Her presentation is highly complicated.  Given her old age, patient is at high risk of deteriorating.  Will need to be treated in hospital for at least 2 days.   Dispo: The patient is from: Home              Anticipated d/c is to: Home              Anticipated d/c date is: 2 days              Patient currently is not medically stable to d/c.   Difficult to place patient No          Date of Service 05/13/2020    Lorretta Harp [facility]   If 7PM-7AM, please contact night-coverage [email or web address] 05/13/2020, 2:33 PM

## 2020-05-13 NOTE — ED Notes (Signed)
Suction cannister emptied by this RN. Pt tolerated well. Pt denies further needs at this time. Call bell remains within reach. Pt resting in bed watching TV. Lights dimmed for patient comfort.

## 2020-05-13 NOTE — ED Notes (Signed)
Pt sitting up in bed eating breakfast at this time. NAD noted at this time. Pt turned down to 2L via [place]. Purewick placed on patient at this time. Medications administered per order at this time.

## 2020-05-13 NOTE — ED Triage Notes (Signed)
Pt arrives to ED from home via [facility] with c/c of SoB beginning last night. Pt has Hx of CHF. EMS reports initial vitals of 150/62, p 62, r 20, initial O2 sat of 86% on room air, improved to 99% on 15L via non-rebreather. CBG 122. Temp 97.8 oral. Upon arrival, pt alert and oriented. Dr Don Perking at pt bedside.

## 2020-05-13 NOTE — ED Notes (Signed)
Advised nurse that patient has assigned bed 

## 2020-05-13 NOTE — ED Provider Notes (Signed)
[facility] Emergency Department Provider Note  ____________________________________________   Event Date/Time   First MD Initiated Contact with Patient 05/13/20 [phone]     (approximate)  I have reviewed the triage vital signs and the nursing notes.   HISTORY  Chief Complaint Shortness of Breath   HPI Sharon Bullock is a 87 y.o. female with a past medical history of GERD, HTN, insomnia, vertigo, depression, paroxysmal A. fib on Coreg, amiodarone and begun to trend, and hypertrophic cardiomyopathy and moderate tricuspid regurgitation followed by [facility] cardiology in [place] not on oxygen at baseline who presents for assessment of acute onset of shortness of breath that began last night with association with some mild cough with yellow sputum.  Patient states she often has some congestion and nasal drainage and intermittent cough at baseline but felt much more short of breath last night. She denies any fevers, chills, chest pain, headache, earache, sore throat, abdominal pain, back pain, rash or extremity pain.  No recent falls or injuries.  Patient denies EtOH use, illicit drug use, or tobacco abuse.  No clearly to the area factors.  No recent prior similar episodes.         Past Medical History:  Diagnosis Date  . Allergy   . CHF (congestive heart failure) (HCC)   . Enlarged heart 2000  . GERD (gastroesophageal reflux disease)   . Hypertension   . Insomnia   . Vertigo 2006    Patient Active Problem List   Diagnosis Date Noted  . Pericardial effusion 04/18/2019  . Moderate major depression, single episode (HCC) 08/03/2018  . Thrombocytopenia (HCC) 05/27/2016  . CHF NYHA class I, chronic, diastolic (HCC) 05/26/2016  . Mesenteric vascular insufficiency (HCC) 02/28/2016  . Pancreas cyst 02/28/2016  . Increased endometrial stripe thickness 02/28/2016  . Carotid atherosclerosis, bilateral 01/23/2016  . Hearing loss 08/17/2015  . Mitral regurgitation  04/19/2015  . Pernicious anemia 09/27/2014  . Paroxysmal A-fib (HCC) 09/27/2014  . Benign essential HTN 09/27/2014  . Perennial allergic rhinitis 09/27/2014  . Chronic anemia 09/27/2014  . Insomnia, persistent 09/27/2014  . Hypertensive pulmonary vascular disease (HCC) 09/27/2014  . B12 deficiency 09/27/2014  . Diverticulosis of colon 09/27/2014  . Dyslipidemia 09/27/2014  . Edema extremities 09/27/2014  . Gastric reflux 09/27/2014  . Bilateral hearing loss 09/27/2014  . Polypharmacy 09/27/2014  . Pelvic pain in female 09/27/2014  . MI (mitral incompetence) 09/27/2014  . Internal hemorrhoids 09/27/2014  . Osteopenia 09/27/2014  . Arthralgia of shoulder 09/27/2014  . Finger tendinitis 09/27/2014  . Urge incontinence 09/27/2014  . Cervical prolapse 09/27/2014  . Vertigo 09/27/2014  . Vitamin D deficiency 09/27/2014  . Bladder cystocele 04/15/2012  . Asymmetric septal hypertrophy (HCC) 05/19/2011  . Hypertrophic cardiomyopathy (HCC) 05/19/2011    Past Surgical History:  Procedure Laterality Date  . BLADDER REPAIR  2014   tact    Prior to Admission medications   Medication Sig Start Date End Date Taking? Authorizing Provider  amiodarone (PACERONE) 200 MG tablet Take 1 tablet by mouth daily. 07/13/14   [provider]  amLODipine (NORVASC) 10 MG tablet Take 1 tablet (10 mg total) by mouth daily. 12/26/19   Alba Cory, MD  carvedilol (COREG) 25 MG tablet TAKE 1 TABLET(25 MG) BY MOUTH TWICE DAILY WITH A MEAL 02/03/20   Carlynn Purl, Danna Hefty, MD  dabigatran (PRADAXA) 150 MG CAPS capsule Take 150 mg by mouth 2 (two) times daily.    Nolon Rod, MD  ferrous gluconate (FERGON) 324 MG tablet  Take 324 mg by mouth daily with breakfast.    [provider]  folic acid (FOLVITE) 400 MCG tablet Take 400 mcg by mouth daily.     [provider]  irbesartan (AVAPRO) 300 MG tablet Take 1 tablet (300 mg total) by mouth daily. 03/22/20   Sowles, Danna Hefty, MD   levocetirizine (XYZAL) 5 MG tablet TAKE 1 TABLET(5 MG) BY MOUTH EVERY EVENING 02/28/20   Carlynn Purl, Danna Hefty, MD  omeprazole (PRILOSEC) 40 MG capsule TAKE 1 CAPSULE(40 MG) BY MOUTH DAILY 03/30/20   Carlynn Purl, Danna Hefty, MD  predniSONE (DELTASONE) 20 MG tablet Take 2.5 tablets (50 mg total) by mouth daily with breakfast. 04/10/20   Jene Every, MD  rosuvastatin (CRESTOR) 10 MG tablet Take 1 tablet (10 mg total) by mouth daily. 12/26/19   Alba Cory, MD  solifenacin (VESICARE) 5 MG tablet Take 1 tablet (5 mg total) by mouth daily. 11/05/18   Alba Cory, MD  spironolactone (ALDACTONE) 25 MG tablet Take 1 tablet by mouth daily. 01/10/19   Nolon Rod, MD  Vitamin D, Cholecalciferol, 1000 UNITS TABS Take 1,000 Units by mouth daily.     [provider]  zolpidem (AMBIEN) 5 MG tablet Take 1 tablet (5 mg total) by mouth at bedtime. 03/22/20   Alba Cory, MD    Allergies Nickel, Other, Ace inhibitors, and Xarelto [rivaroxaban]  Family History  Problem Relation Age of Onset  . Alzheimer's disease Mother   . Hypertension Mother   . Stroke Father   . Heart disease Father   . Heart disease Brother   . Hypertension Brother   . Cancer Daughter        breast  . Hypertension Daughter   . Breast cancer Daughter   . Diabetes Brother   . Heart disease Brother   . Heart disease Brother   . Diabetes Brother   . Hypertension Brother   . Heart disease Brother   . Hypertension Brother   . Cancer Brother        prostate  . Diabetes Brother   . Heart disease Brother   . Hypertension Brother   . Cancer Brother        prostate    Social History Social History   Tobacco Use  . Smoking status: Never Smoker  . Smokeless tobacco: Never Used  . Tobacco comment: smoking cessation materials not required  Vaping Use  . Vaping Use: Never used  Substance Use Topics  . Alcohol use: No    Alcohol/week: 0.0 standard drinks  . Drug use: No    Review of Systems  Review of Systems   Constitutional: Positive for malaise/fatigue. Negative for chills and fever.  HENT: Negative for sore throat.   Eyes: Negative for pain.  Respiratory: Positive for cough and shortness of breath. Negative for stridor.   Cardiovascular: Negative for chest pain.  Gastrointestinal: Negative for vomiting.  Genitourinary: Negative for dysuria.  Musculoskeletal: Negative for myalgias.  Skin: Negative for rash.  Neurological: Negative for seizures, loss of consciousness and headaches.  Psychiatric/Behavioral: Negative for suicidal ideas.  All other systems reviewed and are negative.     ____________________________________________   PHYSICAL EXAM:  VITAL SIGNS: ED Triage Vitals  Enc Vitals Group     BP 05/13/20 0640 130/62     Pulse Rate 05/13/20 0640 (!) 59     Resp 05/13/20 0640 19     Temp 05/13/20 0640 98.1 F (36.7 C)     Temp Source 05/13/20 0640 Oral  SpO2 05/13/20 0639 100 %     Weight 05/13/20 0641 148 lb (67.1 kg)     Height 05/13/20 0641 5\' 4"  (1.626 m)     Head Circumference --      Peak Flow --      Pain Score 05/13/20 0641 0     Pain Loc --      Pain Edu? --      Excl. in GC? --    Vitals:   05/13/20 0930 05/13/20 0933  BP: 102/60   Pulse: (!) 101 99  Resp: 20 16  Temp:    SpO2: (!) 89% 94%   Physical Exam Vitals and nursing note reviewed.  Constitutional:      General: She is not in acute distress.    Appearance: She is well-developed and well-nourished.  HENT:     Head: Normocephalic and atraumatic.     Right Ear: External ear normal.     Left Ear: External ear normal.     Nose: Nose normal.  Eyes:     Conjunctiva/sclera: Conjunctivae normal.  Cardiovascular:     Rate and Rhythm: Normal rate and regular rhythm.     Heart sounds: No murmur heard.   Pulmonary:     Effort: Pulmonary effort is normal. Tachypnea present. No respiratory distress.     Breath sounds: Decreased breath sounds and rales present.  Abdominal:     Palpations: Abdomen  is soft.     Tenderness: There is no abdominal tenderness.  Musculoskeletal:     Cervical back: Neck supple.     Right lower leg: Edema present.     Left lower leg: Edema present.  Skin:    General: Skin is warm and dry.     Capillary Refill: Capillary refill takes less than 2 seconds.  Neurological:     Mental Status: She is alert and oriented to person, place, and time.  Psychiatric:        Mood and Affect: Mood and affect and mood normal.      ____________________________________________   LABS (all labs ordered are listed, but only abnormal results are displayed)  Labs Reviewed  CBC WITH DIFFERENTIAL/PLATELET - Abnormal; Notable for the following components:      Result Value   RBC 3.44 (*)    Hemoglobin 10.8 (*)    HCT 33.6 (*)    Platelets 140 (*)    All other components within normal limits  COMPREHENSIVE METABOLIC PANEL - Abnormal; Notable for the following components:   Glucose, Bld 130 (*)    BUN 24 (*)    Creatinine, Ser 1.09 (*)    Calcium 8.7 (*)    Albumin 3.3 (*)    GFR, Estimated 50 (*)    All other components within normal limits  BRAIN NATRIURETIC PEPTIDE - Abnormal; Notable for the following components:   B Natriuretic Peptide 505.4 (*)    All other components within normal limits  TROPONIN I (HIGH SENSITIVITY) - Abnormal; Notable for the following components:   Troponin I (High Sensitivity) 41 (*)    All other components within normal limits  SARS CORONAVIRUS 2 BY RT PCR (HOSPITAL ORDER, PERFORMED IN [place] [facility])  PROCALCITONIN  TROPONIN I (HIGH SENSITIVITY)   ____________________________________________  EKG  Sinus rhythm with a ventricular rate of 60, normal axis, prolonged PR interval and otherwise unremarkable intervals with some nonspecific changes in aVL, aVF, and no other clearance of acute ischemia or significant underlying arrhythmia.  ____________________________________________  RADIOLOGY  ED MD interpretation: Small  bilateral pleural effusions and pulmonary edema.  No focal consolidation, thorax, or other clear acute intrathoracic process.  Official radiology report(s): DG Chest 2 View  Result Date: 05/13/2020 CLINICAL DATA:  Shortness of breath EXAM: CHEST - 2 VIEW COMPARISON:  04/10/2020 FINDINGS: Chronic cardiopericardial enlargement with small to moderate pericardial effusion on recent chest CT. Hazy bilateral chest opacity with prominent interstitial markings. Small pleural effusions suggested on the lateral view. IMPRESSION: CHF pattern. If pneumonia were superimposed it would be obscured. Electronically Signed   By: Marnee Spring M.D.   On: 05/13/2020 07:49    ____________________________________________   PROCEDURES  Procedure(s) performed (including Critical Care):  .1-3 Lead EKG Interpretation Performed by: Gilles Chiquito, MD Authorized by: Gilles Chiquito, MD     Interpretation: normal     ECG rate assessment: normal     Rhythm: sinus rhythm     Ectopy: none     Conduction: normal    .Critical Care Performed by: Gilles Chiquito, MD Authorized by: Gilles Chiquito, MD   Critical care provider statement:    Critical care time (minutes):  45   Critical care time was exclusive of:  Separately billable procedures and treating other patients   Critical care was necessary to treat or prevent imminent or life-threatening deterioration of the following conditions:  Respiratory failure   Critical care was time spent personally by me on the following activities:  Discussions with consultants, evaluation of patient's response to treatment, examination of patient, ordering and performing treatments and interventions, ordering and review of laboratory studies, ordering and review of radiographic studies, pulse oximetry, re-evaluation of patient's condition, obtaining history from patient or surrogate and review of old charts   Care discussed with: admitting provider        ____________________________________________   INITIAL IMPRESSION / ASSESSMENT AND PLAN / ED COURSE        Patient presents above-stated exam for assessment of acute onset of shortness of breath and cough.  She was reportedly acute approximate respiratory failure with EMS requiring nonrebreather to maintain her SPO2 saturation in the mid 90s with seen sats of 86% on room air.  On arrival patient is alert oriented with decreased lung sounds and rales bilaterally as well as significant lower extremity edema.  Primary differential includes but is not limited to ACS, arrhythmia, PE, CHF exacerbation, pneumonia, symptomatic pleural effusion, acute symptomatic anemia, metabolic derangements bronchitis.  Low suspicion for PE as patient is on the GABA transition is compliant with this.  Chest x-ray does not show evidence of pneumonia and patient has no fever elevated white blood cell count and pro-Cal undetectable.  No evidence of pneumothorax on chest x-ray.  She does have evidence of edema on her chest x-ray as well as small bilateral pleural effusions and her BNP is elevated at 50 05 compared to 410 1 year ago.  CMP shows no significant electrolyte or metabolic derangements.  CBC shows no leukocytosis and hemoglobin at baseline at 10.8 compared to 11.54-month ago.  Troponin is at baseline at 41 given patient denies any chest pain with relatively reassuring EKG of low suspicion for ACS at this time or myocarditis although will plan to trend troponin.  Patient was weaned down to 2 L nasal cannula from her initial nonrebreather however was noted to desat to 89% after significant diuresis from Lasix and she was placed back on 2 L with return to SPO2 of 24%.  Given hypoxia respiratory failure likely  associated with volume overload we will plan admit to hospital service for further evaluation and management.      ____________________________________________   FINAL CLINICAL IMPRESSION(S) / ED  DIAGNOSES  Final diagnoses:  Acute respiratory failure with hypoxia (HCC)  Troponin I above reference range  Elevated brain natriuretic peptide (BNP) level  Acute pulmonary edema (HCC)    Medications  furosemide (LASIX) injection 40 mg (40 mg Intravenous Given 05/13/20 0569)     ED Discharge Orders    None       Note:  This document was prepared using Dragon voice recognition software and may include unintentional dictation errors.   Gilles Chiquito, MD 05/13/20 [phone]

## 2020-05-13 NOTE — ED Notes (Signed)
EDP attempted trial off of O2, pt desat to 89%, pt placed back on 2L via  by this RN, pt back to 94-97% on 2L. Pt and visitor provided warm blankets for comfort at this time. Denies further needs. Call bell within reach. Pt visualized in NAD at this time.

## 2020-05-13 NOTE — Consult Note (Signed)
CARDIOLOGY CONSULT NOTE               Patient ID: Sharon Bullock MRN: [id number] DOB/AGE: [birth year] 87 y.o.  Admit date: 05/13/2020 Referring Physician Lorretta Harp MD hospitalist Primary Physician Alba Cory MD primary Primary Cardiologist Dr Nolon Rod [facility] Reason for Consultation acute respiratory failure congestive heart failure borderline troponins HCOM  HPI: Patient is a 87 year old female with a history of hokum acute on chronic diastolic congestive heart failure hypertension hyperlipidemia GERD anemia chronic insufficiency stage III mild regurgitation paroxysmal defibrillation asymmetric hypertrophy IHSS shortness of breath dyspnea leg edema was doing reasonably well but got acutely dyspneic early this morning around 5 AM she contacted her daughter she had difficulty catching her breath she was found to have sats in the 80s placed on 2 L O2 ruled out for Covid BNP was elevated around 500 troponins were borderline elevated she was in sinus rhythm chest x-ray suggested pulmonary edema so she was admitted for further evaluation and care EKG was nondiagnostic.  Patient was treated with diuretics and now has improved shortness of breath dyspnea now here for further evaluation  Review of systems complete and found to be negative unless listed above     Past Medical History:  Diagnosis Date  . Allergy   . CHF (congestive heart failure) (HCC)   . Enlarged heart 2000  . GERD (gastroesophageal reflux disease)   . Hypertension   . Insomnia   . Vertigo 2006    Past Surgical History:  Procedure Laterality Date  . BLADDER REPAIR  2014   tact    (Not in a hospital admission)  Social History   Socioeconomic History  . Marital status: Widowed    Spouse name: Not on file  . Number of children: 5  . Years of education: some college  . Highest education level: 12th grade  Occupational History  . Occupation: Retired  Tobacco Use  . Smoking status: Never Smoker  . Smokeless  tobacco: Never Used  . Tobacco comment: smoking cessation materials not required  Vaping Use  . Vaping Use: Never used  Substance and Sexual Activity  . Alcohol use: No    Alcohol/week: 0.0 standard drinks  . Drug use: No  . Sexual activity: Not Currently  Other Topics Concern  . Not on file  Social History Narrative   Lost husband 07/18/2017   Grown son still living with her    Social Determinants of Health   Financial Resource Strain: High Risk  . Difficulty of Paying Living Expenses: Very hard  Food Insecurity: No Food Insecurity  . Worried About Programme researcher, broadcasting/film/video in the Last Year: Never true  . Ran Out of Food in the Last Year: Never true  Transportation Needs: Unmet Transportation Needs  . Lack of Transportation (Medical): Yes  . Lack of Transportation (Non-Medical): Yes  Physical Activity: Inactive  . Days of Exercise per Week: 0 days  . Minutes of Exercise per Session: 0 min  Stress: No Stress Concern Present  . Feeling of Stress : Only a little  Social Connections: Unknown  . Frequency of Communication with Friends and Family: Patient refused  . Frequency of Social Gatherings with Friends and Family: Patient refused  . Attends Religious Services: Patient refused  . Active Member of Clubs or Organizations: Patient refused  . Attends Banker Meetings: Patient refused  . Marital Status: Widowed  Intimate Partner Violence: Not At Risk  . Fear of Current or Ex-Partner: No  .  Emotionally Abused: No  . Physically Abused: No  . Sexually Abused: No    Family History  Problem Relation Age of Onset  . Alzheimer's disease Mother   . Hypertension Mother   . Stroke Father   . Heart disease Father   . Heart disease Brother   . Hypertension Brother   . Cancer Daughter        breast  . Hypertension Daughter   . Breast cancer Daughter   . Diabetes Brother   . Heart disease Brother   . Heart disease Brother   . Diabetes Brother   . Hypertension Brother    . Heart disease Brother   . Hypertension Brother   . Cancer Brother        prostate  . Diabetes Brother   . Heart disease Brother   . Hypertension Brother   . Cancer Brother        prostate      Review of systems complete and found to be negative unless listed above      PHYSICAL EXAM  General: Well developed, well nourished, in no acute distress HEENT:  Normocephalic and atramatic Neck:  No JVD.  Lungs: Clear bilaterally to auscultation and percussion. Heart: HRRR . Normal S1 and S2 without gallops or 3/6 sem murmurs.  Abdomen: Bowel sounds are positive, abdomen soft and non-tender  Msk:  Back normal, normal gait. Normal strength and tone for age. Extremities: No clubbing, cyanosis or 2+ edema.   Neuro: Alert and oriented X 3. Psych:  Good affect, responds appropriately  Labs:   Lab Results  Component Value Date   WBC 6.5 05/13/2020   HGB 10.8 (L) 05/13/2020   HCT 33.6 (L) 05/13/2020   MCV 97.7 05/13/2020   PLT 140 (L) 05/13/2020    Recent Labs  Lab 05/13/20 0637  NA 138  K 4.1  CL 107  CO2 23  BUN 24*  CREATININE 1.09*  CALCIUM 8.7*  PROT 6.6  BILITOT 0.8  ALKPHOS 61  ALT 34  AST 34  GLUCOSE 130*   No results found for: CKTOTAL, CKMB, CKMBINDEX, TROPONINI  Lab Results  Component Value Date   CHOL 192 12/26/2019   CHOL 151 05/06/2019   CHOL 168 04/23/2018   Lab Results  Component Value Date   HDL 76 12/26/2019   HDL 65 05/06/2019   HDL 74 04/23/2018   Lab Results  Component Value Date   LDLCALC 103 (H) 12/26/2019   LDLCALC 72 05/06/2019   LDLCALC 78 04/23/2018   Lab Results  Component Value Date   TRIG 51 12/26/2019   TRIG 48 05/06/2019   TRIG 75 04/23/2018   Lab Results  Component Value Date   CHOLHDL 2.5 12/26/2019   CHOLHDL 2.3 05/06/2019   CHOLHDL 2.3 04/23/2018   No results found for: LDLDIRECT    Radiology: DG Chest 2 View  Result Date: 05/13/2020 CLINICAL DATA:  Shortness of breath EXAM: CHEST - 2 VIEW COMPARISON:   04/10/2020 FINDINGS: Chronic cardiopericardial enlargement with small to moderate pericardial effusion on recent chest CT. Hazy bilateral chest opacity with prominent interstitial markings. Small pleural effusions suggested on the lateral view. IMPRESSION: CHF pattern. If pneumonia were superimposed it would be obscured. Electronically Signed   By: Marnee Spring M.D.   On: 05/13/2020 07:49    EKG: Normal sinus rhythm nonspecific ST-T wave changes rate of 70  ASSESSMENT AND PLAN:  Acute / Chronic diastolic CHF  Shortness of breath Hypertension HCOM AFIB Anemia Elevated  troponin Elevated BNP CRI stage III GERD Hyperlipiemia Mitral regurgitation  Plan Recommend evaluation and treatment for acute on chronic diastolic congestive heart failure with IV diuretic therapy Diuretic therapy for pulmonary edema Continue significant blood pressure control Maintain anticoagulation for paroxysmal atrial fibrillation with Pradaxa Supplemental oxygen for hypoxemia Demand ischemia with borderline troponins Follow-up nephrology for renal insufficiency Diuretic therapy for leg edema recommend support stockings elevation Continue Protonix therapy for  GERD symptoms Continue statin therapy for hyperlipidemia Recommend supportive therapy for shortness of breath and heart failure Have the patient follow-up at [facility] with Dr. Consuella Lose for [facility] therapy    Signed: Alwyn Pea MD 05/13/2020, 1:31 PM

## 2020-05-13 NOTE — Plan of Care (Signed)

## 2020-05-14 ENCOUNTER — Inpatient Hospital Stay
Admit: 2020-05-14 | Discharge: 2020-05-14 | Disposition: A | Discharge status: Home / Self Care | Payer: Medicare Other | Attending: Internal Medicine | Admitting: Internal Medicine

## 2020-05-14 DIAGNOSIS — I422 Other hypertrophic cardiomyopathy: Secondary | ICD-10-CM

## 2020-05-14 DIAGNOSIS — J9601 Acute respiratory failure with hypoxia: Secondary | ICD-10-CM

## 2020-05-14 DIAGNOSIS — I5033 Acute on chronic diastolic (congestive) heart failure: Secondary | ICD-10-CM

## 2020-05-14 LAB — ECHOCARDIOGRAM COMPLETE
AR max vel: 1.53 cm2
AV Area VTI: 1.48 cm2
AV Area mean vel: 1.48 cm2
AV Mean grad: 5.3 mmHg
AV Peak grad: 9 mmHg
Ao pk vel: 1.5 m/s
Area-P 1/2: 3.1 cm2
Height: 64 in
P 1/2 time: 450 msec
S' Lateral: 2.28 cm
Weight: 2372.8 oz

## 2020-05-14 LAB — BASIC METABOLIC PANEL
Anion gap: 9 (ref 5–15)
BUN: 18 mg/dL (ref 8–23)
CO2: 24 mmol/L (ref 22–32)
Calcium: 9 mg/dL (ref 8.9–10.3)
Chloride: 105 mmol/L (ref 98–111)
Creatinine, Ser: 1.13 mg/dL — ABNORMAL HIGH (ref 0.44–1.00)
GFR, Estimated: 48 mL/min — ABNORMAL LOW (ref >60)
Glucose, Bld: 107 mg/dL — ABNORMAL HIGH (ref 70–99)
Potassium: 3.4 mmol/L — ABNORMAL LOW (ref 3.5–5.1)
Sodium: 138 mmol/L (ref 135–145)

## 2020-05-14 LAB — CBC
HCT: 31.9 % — ABNORMAL LOW (ref 36.0–46.0)
Hemoglobin: 10.6 g/dL — ABNORMAL LOW (ref 12.0–15.0)
MCH: 31.8 pg (ref 26.0–34.0)
MCHC: 33.2 g/dL (ref 30.0–36.0)
MCV: 95.8 fL (ref 80.0–100.0)
Platelets: 135 10*3/uL — ABNORMAL LOW (ref 150–400)
RBC: 3.33 MIL/uL — ABNORMAL LOW (ref 3.87–5.11)
RDW: 14.9 % (ref 11.5–15.5)
WBC: 7.2 10*3/uL (ref 4.0–10.5)
nRBC: 0 % (ref 0.0–0.2)

## 2020-05-14 LAB — LIPID PANEL
Cholesterol: 149 mg/dL (ref 0–200)
HDL: 66 mg/dL (ref >40)
LDL Cholesterol: 76 mg/dL (ref 0–99)
Total CHOL/HDL Ratio: 2.3 RATIO
Triglycerides: 34 mg/dL (ref <150)
VLDL: 7 mg/dL (ref 0–40)

## 2020-05-14 LAB — HEMOGLOBIN A1C
Hgb A1c MFr Bld: 5.8 % — ABNORMAL HIGH (ref 4.8–5.6)
Mean Plasma Glucose: 119.76 mg/dL

## 2020-05-14 LAB — MAGNESIUM: Magnesium: 1.9 mg/dL (ref 1.7–2.4)

## 2020-05-14 MED ORDER — LORATADINE 10 MG PO TABS
10.0000 mg | ORAL_TABLET | Freq: Every day | ORAL | Status: DC
  Administered 2020-05-14: 10 mg via ORAL
  Filled 2020-05-14: qty 1

## 2020-05-14 MED ORDER — QUETIAPINE FUMARATE 25 MG PO TABS
25.0000 mg | ORAL_TABLET | Freq: Every day | ORAL | Status: DC
  Administered 2020-05-14: 25 mg via ORAL
  Filled 2020-05-14: qty 1

## 2020-05-14 MED ORDER — DILTIAZEM HCL ER COATED BEADS 120 MG PO CP24
240.0000 mg | ORAL_CAPSULE | Freq: Every day | ORAL | Status: DC
  Administered 2020-05-14 – 2020-05-15 (×2): 240 mg via ORAL
  Filled 2020-05-14 (×2): qty 2

## 2020-05-14 MED ORDER — KATE FARMS STANDARD 1.4 PO LIQD
325.0000 mL | Freq: Two times a day (BID) | ORAL | Status: DC
  Filled 2020-05-14: qty 325

## 2020-05-14 MED ORDER — POTASSIUM CHLORIDE 20 MEQ PO PACK
40.0000 meq | PACK | Freq: Once | ORAL | Status: AC
  Administered 2020-05-14: 40 meq via ORAL
  Filled 2020-05-14: qty 2

## 2020-05-14 NOTE — Progress Notes (Signed)
PROGRESS NOTE    Sharon Bullock  CXK:[id number] DOB: Sep 06, 1933 DOA: 05/13/2020 PCP: Alba Cory, MD   Chief complaint for shortness of breath Brief Narrative:  Sharon Bullock is a 87 y.o. female with medical history significant of dCHF, hypertension, hyperlipidemia, GERD, iron deficiency anemia, CKD stage III, mitral valve regurgitation, insomnia, vertigo, hard of hearing, bilateral carotid artery stenosis, atrial fibrillation on Pradaxa, symmetric septal hypertrophy and hypertrophic cardiomyopathy, who presents with shortness of breath. Patient is started on IV Lasix for acute on chronic diastolic congestive heart failure.  He was also requiring O2.   Assessment & Plan:   Principal Problem:   Acute on chronic diastolic CHF (congestive heart failure) (HCC) Active Problems:   Paroxysmal A-fib (HCC)   Benign essential HTN   Iron deficiency anemia   Dyslipidemia   Gastric reflux   Asymmetric septal hypertrophy (HCC)   Acute respiratory failure with hypoxia (HCC)   CKD (chronic kidney disease), stage IIIa   Elevated troponin  #1.  Acute hypoxemic respite failure secondary to acute on chronic diastolic congestive heart failure. Acute on chronic diastolic congestive heart failure secondary to hypertrophic cardiomyopathy. Hypertrophic cardiomyopathy. Elevated troponin secondary to congestive heart failure. Patient currently receiving IV Lasix, appreciate cardiology consult. Condition gradually improving, will wean off oxygen, continue another 24 hours of IV Lasix. Patient most likely will be discharged home tomorrow.  #2.  Paroxysmal atrial fibrillation. Continue Pradaxa and Coreg.  3.  Essential hypertension. Continue home medicines.  4.  Chronic kidney disease stage IIIa. Stable.  #5 hypokalemia. Supplemented.   DVT prophylaxis: Pradaxa Code Status: Full Family Communication:  Disposition Plan:  .   Status is: Inpatient  Remains inpatient appropriate  because:Inpatient level of care appropriate due to severity of illness   Dispo: The patient is from: Home              Anticipated d/c is to: Home              Anticipated d/c date is: 1 day              Patient currently is not medically stable to d/c.   Difficult to place patient No        I/O last 3 completed shifts: In: 240 [P.O.:240] Out: 1000 [Urine:1000] No intake/output data recorded.     Consultants:   Cardiology  Procedures: None  Antimicrobials: None  Subjective: Patient still has some short of breath last night, she could not sleep at nighttime.  She has a chronic insomnia. She still has short of breath with exertion, she is on 2 L oxygen. She has no abdominal pain nausea vomiting. no fever or chills.  Objective: Vitals:   05/13/20 2118 05/14/20 0345 05/14/20 1015 05/14/20 1149  BP: 138/63 (!) 134/58 126/69 118/66  Pulse: 95 65 100 (!) 54  Resp: 17 18 16 15   Temp: 99.1 F (37.3 C) 98.5 F (36.9 C) 98.1 F (36.7 C) 97.7 F (36.5 C)  TempSrc: Oral Oral Axillary Oral  SpO2: 91% 99% 99% 94%  Weight:  67.3 kg    Height:        Intake/Output Summary (Last 24 hours) at 05/14/2020 1151 Last data filed at 05/13/2020 2100 Gross per 24 hour  Intake 240 ml  Output 200 ml  Net 40 ml   Filed Weights   05/13/20 0641 05/14/20 0345  Weight: 67.1 kg 67.3 kg    Examination:  General exam: Appears calm and comfortable  Respiratory system: Decreased breathing sounds. Respiratory  effort normal. Cardiovascular system: S1 & S2 heard, RRR. 2/6 SM at apex, radiated to axilla.  No pedal edema. Gastrointestinal system: Abdomen is nondistended, soft and nontender. No organomegaly or masses felt. Normal bowel sounds heard. Central nervous system: Alert and oriented. No focal neurological deficits. Extremities: Symmetric  Skin: No rashes, lesions or ulcers Psychiatry: Judgement and insight appear normal. Mood & affect appropriate.     Data Reviewed: I have  personally reviewed following labs and imaging studies  CBC: Recent Labs  Lab 05/13/20 0637 05/14/20 0419  WBC 6.5 7.2  NEUTROABS 4.4  --   HGB 10.8* 10.6*  HCT 33.6* 31.9*  MCV 97.7 95.8  PLT 140* 135*   Basic Metabolic Panel: Recent Labs  Lab 05/13/20 0637 05/14/20 0419  NA 138 138  K 4.1 3.4*  CL 107 105  CO2 23 24  GLUCOSE 130* 107*  BUN 24* 18  CREATININE 1.09* 1.13*  CALCIUM 8.7* 9.0  MG  --  1.9   GFR: Estimated Creatinine Clearance: 34.9 mL/min (A) (by C-G formula based on SCr of 1.13 mg/dL (H)). Liver Function Tests: Recent Labs  Lab 05/13/20 0637  AST 34  ALT 34  ALKPHOS 61  BILITOT 0.8  PROT 6.6  ALBUMIN 3.3*   No results for input(s): LIPASE, AMYLASE in the last 168 hours. No results for input(s): AMMONIA in the last 168 hours. Coagulation Profile: No results for input(s): INR, PROTIME in the last 168 hours. Cardiac Enzymes: No results for input(s): CKTOTAL, CKMB, CKMBINDEX, TROPONINI in the last 168 hours. BNP (last 3 results) No results for input(s): PROBNP in the last 8760 hours. HbA1C: No results for input(s): HGBA1C in the last 72 hours. CBG: No results for input(s): GLUCAP in the last 168 hours. Lipid Profile: Recent Labs    05/14/20 0419  CHOL 149  HDL 66  LDLCALC 76  TRIG 34  CHOLHDL 2.3   Thyroid Function Tests: No results for input(s): TSH, T4TOTAL, FREET4, T3FREE, THYROIDAB in the last 72 hours. Anemia Panel: No results for input(s): VITAMINB12, FOLATE, FERRITIN, TIBC, IRON, RETICCTPCT in the last 72 hours. Sepsis Labs: Recent Labs  Lab 05/13/20 8546  PROCALCITON <0.10    Recent Results (from the past 240 hour(s))  SARS Coronavirus 2 by RT PCR (hospital order, performed in [facility] hospital lab) Nasopharyngeal Nasopharyngeal Swab     Status: None   Collection Time: 05/13/20  6:37 AM   Specimen: Nasopharyngeal Swab  Result Value Ref Range Status   SARS Coronavirus 2 NEGATIVE NEGATIVE Final    Comment:  (NOTE) SARS-CoV-2 target nucleic acids are NOT DETECTED.  The SARS-CoV-2 RNA is generally detectable in upper and lower respiratory specimens during the acute phase of infection. The lowest concentration of SARS-CoV-2 viral copies this assay can detect is 250 copies / mL. A negative result does not preclude SARS-CoV-2 infection and should not be used as the sole basis for treatment or other patient management decisions.  A negative result may occur with improper specimen collection / handling, submission of specimen other than nasopharyngeal swab, presence of viral mutation(s) within the areas targeted by this assay, and inadequate number of viral copies (<250 copies / mL). A negative result must be combined with clinical observations, patient history, and epidemiological information.  Fact Sheet for Patients:   BoilerBrush.com.cy  Fact Sheet for Healthcare Providers: https://pope.com/  This test is not yet approved or  cleared by the [place] FDA and has been authorized for detection and/or diagnosis of SARS-CoV-2  by FDA under an Emergency Use Authorization (EUA).  This EUA will remain in effect (meaning this test can be used) for the duration of the COVID-19 declaration under Section 564(b)(1) of the Act, 21 U.S.C. section 360bbb-3(b)(1), unless the authorization is terminated or revoked sooner.  Performed at [facility], [address], [place], [place] [address]          Radiology Studies: DG Chest 2 View  Result Date: 05/13/2020 CLINICAL DATA:  Shortness of breath EXAM: CHEST - 2 VIEW COMPARISON:  04/10/2020 FINDINGS: Chronic cardiopericardial enlargement with small to moderate pericardial effusion on recent chest CT. Hazy bilateral chest opacity with prominent interstitial markings. Small pleural effusions suggested on the lateral view. IMPRESSION: CHF pattern. If pneumonia were superimposed it would be  obscured. Electronically Signed   By: Marnee Spring M.D.   On: 05/13/2020 07:49        Scheduled Meds: . amiodarone  200 mg Oral Daily  . amLODipine  10 mg Oral Daily  . carvedilol  25 mg Oral BID WC  . cholecalciferol  1,000 Units Oral Daily  . cyanocobalamin  1,000 mcg Intramuscular Q30 days  . dabigatran  150 mg Oral BID  . darifenacin  7.5 mg Oral Daily  . ferrous gluconate  324 mg Oral Q breakfast  . folic acid  500 mcg Oral Daily  . irbesartan  300 mg Oral Daily  . loratadine  10 mg Oral q1800  . pantoprazole  40 mg Oral Daily  . rosuvastatin  10 mg Oral Daily  . sodium chloride flush  3 mL Intravenous Q12H  . zolpidem  5 mg Oral QHS   Continuous Infusions: . sodium chloride       LOS: 1 day    Time spent: 28 minutes    Marrion Coy, MD [facility]   To contact the attending provider between 7A-7P or the covering provider during after hours 7P-7A, please log into the web site [email or web address] and access using universal [place] password for that web site. If you do not have the password, please call the hospital operator.  05/14/2020, 11:51 AM

## 2020-05-14 NOTE — Evaluation (Signed)
Occupational Therapy Evaluation Patient Details Name: Sharon Bullock MRN: [id number] DOB: 16-Jan-1934 Today's Date: 05/14/2020    History of Present Illness Sharon Bullock is a 87 y.o. female with medical history significant of dCHF, hypertension, hyperlipidemia, GERD, iron deficiency anemia, CKD stage III, mitral valve regurgitation, insomnia, vertigo, hard of hearing, bilateral carotid artery stenosis, atrial fibrillation on Pradaxa, symmetric septal hypertrophy and hypertrophic cardiomyopathy, who presents with shortness of breath.   Clinical Impression   Ms Brix was seen for OT evaluation this date. Prior to hospital admission, pt was Independent using SPC as needed for I/ADLs and mobility. Pt lives c paraplegic son (T1, independent) in home with ramped entrance. Pt demonstrates near baseline independence to perform ADL and mobility tasks. No skilled acute OT needs identified. Will sign off. Please re-consult if additional OT needs arise. Pt educated in energy conservation strategies including pursed lip breathing, activity pacing, home/routines modifications, work simplification, AE/DME, and falls prevention. Upon hospital discharge, recommend No OT follow up and recommend cardiopulmonary rehab.  At rest: SpO2 94% on 2.5L [place], removed for grooming and 91% on RA.  Ambulated ~80 ft in room and desat 89% on RA, resolved to 92% on 1L [place]    Follow Up Recommendations  No OT follow up;Other (comment) (cardiopulmonary rehab)    Equipment Recommendations  None recommended by OT    Recommendations for Other Services       Precautions / Restrictions Precautions Precautions: Fall Restrictions Weight Bearing Restrictions: No      Mobility Bed Mobility Overal bed mobility: Modified Independent                  Transfers Overall transfer level: Needs assistance Equipment used: None Transfers: Sit to/from Stand Sit to Stand: Supervision              Balance Overall balance  assessment: Mild deficits observed, not formally tested                                         ADL either performed or assessed with clinical judgement   ADL Overall ADL's : Needs assistance/impaired                                       General ADL Comments: SUPERVISION w/o AD and VCs for rest breaks                  Pertinent Vitals/Pain Pain Assessment:  (Pt denies pain but endorses ongoing "heaviness" on chest that makes it difficult to breath, pt reports this is not new, nursing made aware.)     Hand Dominance     Extremity/Trunk Assessment Upper Extremity Assessment Upper Extremity Assessment: Overall WFL for tasks assessed   Lower Extremity Assessment Lower Extremity Assessment: Generalized weakness       Communication Communication Communication: HOH   Cognition Arousal/Alertness: Awake/alert Behavior During Therapy: WFL for tasks assessed/performed Overall Cognitive Status: Within Functional Limits for tasks assessed                                 General Comments: Very pleasant lady willing to participate.   General Comments  SpO2 94% on 2.5L [place], removed for grooming and 91% on RA. Ambulated ~60 ft in  room and desat 89% on RA, resolved to 92% on 1L [place].    Exercises Exercises: Other exercises Other Exercises Other Exercises: Pt educated re: OT role, DME recs, d/c recs, falls prevention, ECS Other Exercises: LBD, grooming, sup<>sit, sit<>stand, ~80 ft in room mobility, sitting/standing balance/tolerance   Shoulder Instructions      Home Living Family/patient expects to be discharged to:: Private residence Living Arrangements: Children (son with paraplegia (T1) who is mod I & driving) Available Help at Discharge: Family;Available PRN/intermittently Type of Home: House Home Access:  (3 steps or can use ramp)     Home Layout: One level     Bathroom Shower/Tub: Tub/shower unit         Home  Equipment: Cane - single point;Walker - 2 wheels          Prior Functioning/Environment Level of Independence: Independent        Comments: Pt reports using SPC PRN, otherwise no AD. Cooking, cleaning, driving.        OT Problem List: Decreased activity tolerance;Decreased safety awareness         OT Goals(Current goals can be found in the care plan section) Acute Rehab OT Goals Patient Stated Goal: To return to PLOF OT Goal Formulation: With patient Time For Goal Achievement: 05/28/20 Potential to Achieve Goals: Good   AM-PAC OT "6 Clicks" Daily Activity     Outcome Measure Help from another person eating meals?: None Help from another person taking care of personal grooming?: None Help from another person toileting, which includes using toliet, bedpan, or urinal?: None Help from another person bathing (including washing, rinsing, drying)?: A Little Help from another person to put on and taking off regular upper body clothing?: None Help from another person to put on and taking off regular lower body clothing?: None 6 Click Score: 23   End of Session Equipment Utilized During Treatment: Oxygen  Activity Tolerance: Patient tolerated treatment well Patient left: in bed;with call bell/phone within reach  OT Visit Diagnosis: Other abnormalities of gait and mobility (R26.89)                Time: 4235-3614 OT Time Calculation (min): 22 min Charges:  OT General Charges $OT Visit: 1 Visit OT Evaluation $OT Eval Low Complexity: 1 Low OT Treatments $Self Care/Home Management : 8-22 mins  Kathie Dike, M.S. OTR/L  05/14/20, 2:57 PM  ascom [phone]

## 2020-05-14 NOTE — TOC Initial Note (Signed)
Transition of Care [facility] - Initial/Assessment Note    Patient Details  Name: Sharon Bullock MRN: [id number] Date of Birth: 26-Sep-1933  Transition of Care [facility]) CM/SW Contact:    Hetty Ely, RN Phone Number: 05/14/2020, 1:15 PM  Clinical Narrative:   Patient alert & oriented x4, in no acute distress. Lives at home with paraplegic son, who is independent, drives and take care of himself per patient. Patient verbalizes  Being independent with ADL's,cooking and shopping. Able to drive to medical appointments and pick up medications at the [facility] pharmacy in [place]. Patient uses cane at home when needed, denies any other equipment or health care services needed. Will continue to track for discharge needs.               Expected Discharge Plan: Home/Self Care Barriers to Discharge: Continued Medical Work up   Patient Goals and CMS Choice Patient states their goals for this hospitalization and ongoing recovery are:: To return home.   Choice offered to / list presented to : NA  Expected Discharge Plan and Services Expected Discharge Plan: Home/Self Care In-house Referral: NA Discharge Planning Services: NA Post Acute Care Choice: NA Living arrangements for the past 2 months: Single Family Home                 DME Arranged: N/A DME Agency: NA       HH Arranged: NA HH Agency: NA        Prior Living Arrangements/Services Living arrangements for the past 2 months: Single Family Home Lives with:: Adult Children Patient language and need for interpreter reviewed:: Yes Do you feel safe going back to the place where you live?: Yes      Need for Family Participation in Patient Care: No (Comment) Care giver support system in place?: Yes (comment)   Criminal Activity/Legal Involvement Pertinent to Current Situation/Hospitalization: No - Comment as needed  Activities of Daily Living Home Assistive Devices/Equipment: None ADL Screening (condition at time of admission) Patient's  cognitive ability adequate to safely complete daily activities?: Yes Is the patient deaf or have difficulty hearing?: Yes (right ear) Does the patient have difficulty seeing, even when wearing glasses/contacts?: No Does the patient have difficulty concentrating, remembering, or making decisions?: No Patient able to express need for assistance with ADLs?: Yes Does the patient have difficulty dressing or bathing?: No Independently performs ADLs?: Yes (appropriate for developmental age) Does the patient have difficulty walking or climbing stairs?: Yes Weakness of Legs: Both Weakness of Arms/Hands: Both  Permission Sought/Granted Permission sought to share information with : Case Manager Permission granted to share information with : No              Emotional Assessment Appearance:: Appears younger than stated age Attitude/Demeanor/Rapport: Engaged Affect (typically observed): Flat Orientation: : Oriented to Self,Oriented to Place,Oriented to  Time,Oriented to Situation Alcohol / Substance Use: Not Applicable Psych Involvement: No (comment)  Admission diagnosis:  Acute pulmonary edema (HCC) [J81.0] Elevated brain natriuretic peptide (BNP) level [R79.89] Acute respiratory failure with hypoxia (HCC) [J96.01] Troponin I above reference range [R77.8] B12 deficiency [E53.8] Acute on chronic diastolic CHF (congestive heart failure) (HCC) [I50.33] Patient Active Problem List   Diagnosis Date Noted  . Acute on chronic diastolic CHF (congestive heart failure) (HCC) 05/13/2020  . CKD (chronic kidney disease), stage IIIa 05/13/2020  . Elevated troponin 05/13/2020  . Pericardial effusion 04/18/2019  . Acute respiratory failure with hypoxia (HCC) 11/30/2018  . Moderate major depression, single episode (HCC) 08/03/2018  .  Thrombocytopenia (HCC) 05/27/2016  . CHF NYHA class I, chronic, diastolic (HCC) 05/26/2016  . Mesenteric vascular insufficiency (HCC) 02/28/2016  . Pancreas cyst  02/28/2016  . Increased endometrial stripe thickness 02/28/2016  . Carotid atherosclerosis, bilateral 01/23/2016  . Hearing loss 08/17/2015  . Mitral regurgitation 04/19/2015  . Pernicious anemia 09/27/2014  . Paroxysmal A-fib (HCC) 09/27/2014  . Benign essential HTN 09/27/2014  . Perennial allergic rhinitis 09/27/2014  . Iron deficiency anemia 09/27/2014  . Insomnia, persistent 09/27/2014  . Hypertensive pulmonary vascular disease (HCC) 09/27/2014  . B12 deficiency 09/27/2014  . Diverticulosis of colon 09/27/2014  . Dyslipidemia 09/27/2014  . Edema extremities 09/27/2014  . Gastric reflux 09/27/2014  . Bilateral hearing loss 09/27/2014  . Polypharmacy 09/27/2014  . Pelvic pain in female 09/27/2014  . MI (mitral incompetence) 09/27/2014  . Internal hemorrhoids 09/27/2014  . Osteopenia 09/27/2014  . Arthralgia of shoulder 09/27/2014  . Finger tendinitis 09/27/2014  . Urge incontinence 09/27/2014  . Cervical prolapse 09/27/2014  . Vertigo 09/27/2014  . Vitamin D deficiency 09/27/2014  . Bladder cystocele 04/15/2012  . Asymmetric septal hypertrophy (HCC) 05/19/2011  . Hypertrophic cardiomyopathy (HCC) 05/19/2011   PCP:  Alba Cory, MD Pharmacy:   [facility] [phone] - [place], [place] - [address] AT [facility] OF [address] [place] [address] [place] [address] Phone: [phone] Fax: [phone]  [facility] #[id number] [place], [place] - [address] AT [facility] OF [address] [place] [address] Phone: [phone] Fax: [phone]     Social Determinants of Health (SDOH) Interventions    Readmission Risk Interventions No flowsheet data found.

## 2020-05-14 NOTE — Consult Note (Signed)
   Heart Failure Nurse Navigator Note  HFpEF greater than 65% by echocardiogram performed in August 2020.  Moderate concentric LVH.  Normal right ventricular systolic function.  Diastolic dysfunction.  Mild aortic stenosis.  Moderate mitral stenosis.  Mild to moderate tricuspid regurgitation.  Echocardiogram is pending on this admission.   Comorbidities:  Hypertension Hyperlipidemia GERD Anemia Chronic renal insufficiency stage III Atrial fibrillation  Medications:  Amiodarone 200 mg daily Amlodipine 10 mg daily  Coreg 25 mg 2 times a day Pradaxa 150 mg 2 times a day Irbesartan 300 mg daily Crestor 10 mg daily   Labs:  Sodium 138, potassium 3.4, chloride 105, CO2 24, BUN 18 creatinine 1.13, magnesium 1.9, total cholesterol 149, triglycerides 34, HDL 66, LDL 76, troponins 676, 130 and 120.  Hemoglobin A1c is pending. Weight is 67.3 kg BMI 25.46 Blood pressure 134/58 Intake 240 mL Output 1000 mL  Assessment:  General-she is awake and alert lying in bed watching television, in no acute distress.  HEENT-she wears glasses, normal dentition.  No JVD assessed.  Cardiac-heart tones of regular rate and rhythm with a systolic murmur.  Chest-lungs are clear to posterior auscultation.  Abdomen-soft non tender.  Musculoskeletal-no lower extremity edema noted.  Psych-she is pleasant and appropriate, makes good eye contact.   Neurologic-she moves all extremities without difficulty.    Initial visit with patient.  She states after her last hospitalization she had been weighing herself daily but recently here got out of that habit.  Discussed weighing daily, recording and what to report to physician.  She voices understanding.  She states when she eats she does not add salt at the table but she does use it when she is cooking.  Discussed cutting back on using salt.  She does state that she stays away from foods that are high in sodium content and tries to eat fresh  vegetables, chicken and fish.  She lives at home with her son who is a paraplegic, she does all the cooking and taking care of the home although she said he does his own bed and drives a vehicle.  Offered her the heart failure videos and she states that she will look them those when she is done eating.  She was given living with heart failure teaching booklet, the zone magnet and information about the heart failure clinic.

## 2020-05-14 NOTE — Progress Notes (Addendum)
Initial Nutrition Assessment  DOCUMENTATION CODES:   Non-severe (moderate) malnutrition in context of chronic illness  INTERVENTION:  Provide Dillard [place] Standard 1.4 Cal vanilla po BID between meals, each supplement provides 455 kcal and 20 grams of protein.  Encouraged adequate intake of calories and protein at meals.  NUTRITION DIAGNOSIS:   Moderate Malnutrition related to chronic illness (CHF, CKD) as evidenced by mild fat depletion,mild muscle depletion,moderate muscle depletion.  GOAL:   Patient will meet greater than or equal to 90% of their needs  MONITOR:   PO intake,Supplement acceptance,Labs,Weight trends,I & O's  REASON FOR ASSESSMENT:   Malnutrition Screening Tool    ASSESSMENT:   87 year old female with PMHx of HTN, GERD, CHF, HLD, iron deficiency anemia, CKD stage III, mitral valve regurgitation, insomnia, vertigo, hard of hearing, bilateral carotid artery stenosis, A-fib, symmetric septal hypertrophy and hypertrophic cardiomyopathy admitted with acute on chronic diastolic CHF.   Met with patient at bedside. She reports her appetite and intake have been decreased for several months now. She eats approximately two meals per day and reports she is eating less at meals than she used to. She may have chicken or [place] with salad. She reports occasionally eating [place] bacon. Patient reports she has learned about low sodium diet, which she follows at home. Denies any educational needs at this time. Discussed importance of adequate intake. Patient reports she has tried multiple oral nutrition supplements before and does not tolerate them as they give her diarrhea. After discussing options available on formulary patient is amenable to trying [employer].   Patient reports her UBW was 167-169 lbs and she has lost weight over the past several months. Patient currently documented to be 67.3 kg (148.3 lbs). Per review of chart patient has been fairly weight-stable for >1 year now.  She was 70.1 kg on 02/04/2019.  Medications reviewed and include: vitamin D 1000 units daily, vitamin B12 1000 micrograms every 30 days IM, folic acid 427 micrograms daily, Protonix.  Labs reviewed: Potassium 3.4, Creatinine 1.13.  NUTRITION - FOCUSED PHYSICAL EXAM:  Flowsheet Row Most Recent Value  Orbital Region Mild depletion  Upper Arm Region Moderate depletion  Thoracic and Lumbar Region No depletion  Buccal Region Mild depletion  Temple Region Mild depletion  Clavicle Bone Region Moderate depletion  Clavicle and Acromion Bone Region Moderate depletion  Scapular Bone Region Mild depletion  Dorsal Hand Moderate depletion  Patellar Region Moderate depletion  Anterior Thigh Region Moderate depletion  Posterior Calf Region Mild depletion  Edema (RD Assessment) None  Hair Reviewed  Eyes Reviewed  Mouth Reviewed  Skin Reviewed  Nails Reviewed     Diet Order:   Diet Order            Diet Heart Room service appropriate? Yes; Fluid consistency: Thin; Fluid restriction: 1500 mL Fluid  Diet effective now                EDUCATION NEEDS:   No education needs have been identified at this time  Skin:  Skin Assessment: Reviewed RN Assessment  Last BM:  Unknown  Height:   Ht Readings from Last 1 Encounters:  05/13/20 $RemoveB'5\' 4"'[id number]$  (1.626 m)   Weight:   Wt Readings from Last 1 Encounters:  05/14/20 67.3 kg   BMI:  Body mass index is 25.46 kg/m.  Estimated Nutritional Needs:   Kcal:  0623-7628  Protein:  85-95 grams  Fluid:  1.5-1.7 L/day  Jacklynn Barnacle, MS, RD, LDN Pager number available on  Amion

## 2020-05-14 NOTE — Progress Notes (Signed)
[facility] Cardiology    SUBJECTIVE: Patient states to be done reasonably well but has had an episode of acute dyspnea while in bed last night which is similar to what brought into the hospital patient has IHSS as well as mitral regurgitation and is concerning that this may be related to insufficiency of the mitral valve versus diastolic dysfunction or both.  Patient denies any chest pain but is not clear that she has had an ischemia work-up recently   Vitals:   05/13/20 2118 05/14/20 0345 05/14/20 1015 05/14/20 1149  BP: 138/63 (!) 134/58 126/69 118/66  Pulse: 95 65 100 (!) 54  Resp: 17 18 16 15   Temp: 99.1 F (37.3 C) 98.5 F (36.9 C) 98.1 F (36.7 C) 97.7 F (36.5 C)  TempSrc: Oral Oral Axillary Oral  SpO2: 91% 99% 99% 94%  Weight:  67.3 kg    Height:         Intake/Output Summary (Last 24 hours) at 05/14/2020 1425 Last data filed at 05/13/2020 2100 Gross per 24 hour  Intake 240 ml  Output 200 ml  Net 40 ml      PHYSICAL EXAM  General: Well developed, well nourished, in no acute distress HEENT:  Normocephalic and atramatic Neck:  No JVD.  Lungs: Clear bilaterally to auscultation and percussion. Heart: HRRR . Normal S1 and S2 without gallops or murmurs.  Abdomen: Bowel sounds are positive, abdomen soft and non-tender  Msk:  Back normal, normal gait. Normal strength and tone for age. Extremities: No clubbing, cyanosis or edema.   Neuro: Alert and oriented X 3. Psych:  Good affect, responds appropriately   LABS: Basic Metabolic Panel: Recent Labs    05/13/20 0637 05/14/20 0419  NA 138 138  K 4.1 3.4*  CL 107 105  CO2 23 24  GLUCOSE 130* 107*  BUN 24* 18  CREATININE 1.09* 1.13*  CALCIUM 8.7* 9.0  MG  --  1.9   Liver Function Tests: Recent Labs    05/13/20 0637  AST 34  ALT 34  ALKPHOS 61  BILITOT 0.8  PROT 6.6  ALBUMIN 3.3*   No results for input(s): LIPASE, AMYLASE in the last 72 hours. CBC: Recent Labs    05/13/20 0637 05/14/20 0419  WBC 6.5 7.2   NEUTROABS 4.4  --   HGB 10.8* 10.6*  HCT 33.6* 31.9*  MCV 97.7 95.8  PLT 140* 135*   Cardiac Enzymes: No results for input(s): CKTOTAL, CKMB, CKMBINDEX, TROPONINI in the last 72 hours. BNP: Invalid input(s): POCBNP D-Dimer: No results for input(s): DDIMER in the last 72 hours. Hemoglobin A1C: Recent Labs    05/14/20 0419  HGBA1C 5.8*   Fasting Lipid Panel: Recent Labs    05/14/20 0419  CHOL 149  HDL 66  LDLCALC 76  TRIG 34  CHOLHDL 2.3   Thyroid Function Tests: No results for input(s): TSH, T4TOTAL, T3FREE, THYROIDAB in the last 72 hours.  Invalid input(s): FREET3 Anemia Panel: No results for input(s): VITAMINB12, FOLATE, FERRITIN, TIBC, IRON, RETICCTPCT in the last 72 hours.  DG Chest 2 View  Result Date: 05/13/2020 CLINICAL DATA:  Shortness of breath EXAM: CHEST - 2 VIEW COMPARISON:  04/10/2020 FINDINGS: Chronic cardiopericardial enlargement with small to moderate pericardial effusion on recent chest CT. Hazy bilateral chest opacity with prominent interstitial markings. Small pleural effusions suggested on the lateral view. IMPRESSION: CHF pattern. If pneumonia were superimposed it would be obscured. Electronically Signed   By: 04/12/2020 M.D.   On: 05/13/2020 07:49  ECHOCARDIOGRAM COMPLETE  Result Date: 05/14/2020    ECHOCARDIOGRAM REPORT   Patient Name:   Sharon Bullock Date of Exam: 05/14/2020 Medical Rec #:  [id number]    Height:       64.0 in Accession #:    [id number]   Weight:       148.3 lb Date of Birth:  July 14, 1933    BSA:          1.723 m Patient Age:    86 years     BP:           134/58 mmHg Patient Gender: F            HR:           65 bpm. Exam Location:  [facility] Procedure: 2D Echo, Cardiac Doppler and Color Doppler Indications:     CHF- acut ediastolic I50.31  History:         Patient has prior history of Echocardiogram examinations, most                  recent 11/30/2018. CHF; Risk Factors:Hypertension.  Sonographer:     Cristela Blue RDCS (AE) Referring Phys:   [id number] Brien Few NIU Diagnosing Phys: Arnoldo Hooker MD IMPRESSIONS  1. Left ventricular ejection fraction, by estimation, is 50 to 55%. The left ventricle has low normal function. The left ventricle has no regional wall motion abnormalities. The left ventricular internal cavity size was mildly dilated. Left ventricular diastolic parameters are consistent with Grade II diastolic dysfunction (pseudonormalization).  2. Right ventricular systolic function is normal. The right ventricular size is normal.  3. Left atrial size was severely dilated.  4. A small pericardial effusion is present. The pericardial effusion is posterior to the left ventricle and the left atrium.  5. The mitral valve is normal in structure. Moderate to severe mitral valve regurgitation.  6. The aortic valve is normal in structure. Aortic valve regurgitation is trivial. FINDINGS  Left Ventricle: Left ventricular ejection fraction, by estimation, is 50 to 55%. The left ventricle has low normal function. The left ventricle has no regional wall motion abnormalities. The left ventricular internal cavity size was mildly dilated. There is borderline left ventricular hypertrophy. Left ventricular diastolic parameters are consistent with Grade II diastolic dysfunction (pseudonormalization). Right Ventricle: The right ventricular size is normal. No increase in right ventricular wall thickness. Right ventricular systolic function is normal. Left Atrium: Left atrial size was severely dilated. Right Atrium: Right atrial size was normal in size. Pericardium: A small pericardial effusion is present. The pericardial effusion is posterior to the left ventricle and the left atrium. Mitral Valve: The mitral valve is normal in structure. Moderate to severe mitral valve regurgitation. Tricuspid Valve: The tricuspid valve is normal in structure. Tricuspid valve regurgitation is mild. Aortic Valve: The aortic valve is normal in structure. Aortic valve regurgitation is  trivial. Aortic regurgitation PHT measures 450 msec. Aortic valve mean gradient measures 5.3 mmHg. Aortic valve peak gradient measures 9.0 mmHg. Aortic valve area, by VTI measures 1.48 cm. Pulmonic Valve: The pulmonic valve was normal in structure. Pulmonic valve regurgitation is trivial. Aorta: The aortic root and ascending aorta are structurally normal, with no evidence of dilitation. IAS/Shunts: No atrial level shunt detected by color flow Doppler.  LEFT VENTRICLE PLAX 2D LVIDd:         4.46 cm  Diastology LVIDs:         2.28 cm  LV e' medial:    3.81 cm/s LV PW:  1.66 cm  LV E/e' medial:  33.1 LV IVS:        1.38 cm  LV e' lateral:   10.40 cm/s LVOT diam:     2.00 cm  LV E/e' lateral: 12.1 LV SV:         35 LV SV Index:   20 LVOT Area:     3.14 cm  RIGHT VENTRICLE RV Basal diam:  4.29 cm RV S prime:     14.40 cm/s TAPSE (M-mode): 3.8 cm LEFT ATRIUM              Index        RIGHT ATRIUM           Index LA diam:        5.70 cm  3.31 cm/m   RA Area:     21.90 cm LA Vol (A2C):   199.0 ml 115.51 ml/m RA Volume:   68.90 ml  39.99 ml/m LA Vol (A4C):   166.0 ml 96.35 ml/m LA Biplane Vol: 195.0 ml 113.18 ml/m  AORTIC VALVE                    PULMONIC VALVE AV Area (Vmax):    1.53 cm     PV Vmax:        0.81 m/s AV Area (Vmean):   1.48 cm     PV Peak grad:   2.6 mmHg AV Area (VTI):     1.48 cm     RVOT Peak grad: 4 mmHg AV Vmax:           150.33 cm/s AV Vmean:          106.167 cm/s AV VTI:            0.236 m AV Peak Grad:      9.0 mmHg AV Mean Grad:      5.3 mmHg LVOT Vmax:         73.10 cm/s LVOT Vmean:        50.000 cm/s LVOT VTI:          0.111 m LVOT/AV VTI ratio: 0.47 AI PHT:            450 msec  AORTA Ao Root diam: 3.30 cm MITRAL VALVE                TRICUSPID VALVE MV Area (PHT): 3.10 cm     TR Peak grad:   40.4 mmHg MV Decel Time: 245 msec     TR Vmax:        318.00 cm/s MV E velocity: 126.00 cm/s                             SHUNTS                             Systemic VTI:  0.11 m                              Systemic Diam: 2.00 cm Arnoldo Hooker MD Electronically signed by Arnoldo Hooker MD Signature Date/Time: 05/14/2020/1:12:54 PM    Final      Echo borderline overall left ventricular function and diastolic dysfunction but moderate to severe mitral regurgitation recommend continue diuretic therapy with consider whether invasive procedure like a mitral clip might be helpful  TELEMETRY: Sinus rhythm  at 100  ASSESSMENT AND PLAN:  Principal Problem:   Acute on chronic diastolic CHF (congestive heart failure) (HCC) Active Problems:   Paroxysmal A-fib (HCC)   Benign essential HTN   Iron deficiency anemia   Dyslipidemia   Gastric reflux   Asymmetric septal hypertrophy (HCC)   Acute respiratory failure with hypoxia (HCC)   CKD (chronic kidney disease), stage IIIa   Elevated troponin    Plan Diuretic therapy for shortness of breath Continue anticoagulation with Pradaxa Amiodarone 200 mg a day for A. Fib Discontinue amlodipine in favor of Cardizem to 40 to help with diastolic dysfunction Continue Coreg hyper [place] spironolactone for heart failure Omeprazole as necessary for reflux symptoms Continue Crestor for hyperlipidemia Consider nephrology input for renal insufficiency Troponins are consistent with demand ischemia not a non-STEMI We will advance medical therapy for [facility] Patient may need an ischemia work-up at some point     Alwyn Pea, MD 05/14/2020 2:25 PM

## 2020-05-14 NOTE — Progress Notes (Signed)
*  PRELIMINARY RESULTS* Echocardiogram 2D Echocardiogram has been performed.  Cristela Blue 05/14/2020, 8:28 AM

## 2020-05-14 NOTE — Evaluation (Signed)
Physical Therapy Evaluation Patient Details Name: Sharon Bullock MRN: [id number] DOB: May 21, 1933 Today's Date: 05/14/2020   History of Present Illness  Sharon Bullock is a 87 y.o. female with medical history significant of dCHF, hypertension, hyperlipidemia, GERD, iron deficiency anemia, CKD stage III, mitral valve regurgitation, insomnia, vertigo, hard of hearing, bilateral carotid artery stenosis, atrial fibrillation on Pradaxa, symmetric septal hypertrophy and hypertrophic cardiomyopathy, who presents with shortness of breath.  Clinical Impression  Pt seen for PT evaluation with pt reporting prior to admission she was independent without AD, only using SPC PRN (2/2 5 year hx of vertigo). Pt is able to ambulate increased distances with [facility] & supervision but maintains guarded posture, which pt reports is preexisting 2/2 fear of falling. Provided pt with RW & pt able to ambulate 2nd lap around nurses station with supervision PT educating pt on proper use of AD but would benefit from additional practice, especially in small spaces. Pt would benefit from HHPT upon d/c to address high level balance & progress gait with LRAD to reduce fall risk & return to PLOF.     Follow Up Recommendations Home health PT;Supervision for mobility/OOB    Equipment Recommendations  Rolling walker with 5" wheels    Recommendations for Other Services       Precautions / Restrictions Precautions Precautions: Fall Restrictions Weight Bearing Restrictions: No      Mobility  Bed Mobility Overal bed mobility: Modified Independent             General bed mobility comments: HOB elevated    Transfers Overall transfer level: Needs assistance Equipment used: Straight cane Transfers: Sit to/from Stand Sit to Stand: Supervision            Ambulation/Gait Ambulation/Gait assistance: Supervision Gait Distance (Feet): 170 Feet (+ 170 ft) Assistive device: Straight cane;Rolling walker (2 wheeled) Gait  Pattern/deviations: Decreased step length - left;Decreased step length - right;Decreased stride length Gait velocity: decreased   General Gait Details: Pt maintains LUE shoulder/elbow flexed in close to her trunk. Upon questioning pt reports it's because she's fearful of falling at home (endorses no falls though) & has many areas with stone around her house. Pt is able to place LUE to her side.  Stairs            Wheelchair Mobility    Modified Rankin (Stroke Patients Only)       Balance Overall balance assessment: Mild deficits observed, not formally tested;Needs assistance Sitting-balance support: No upper extremity supported;Feet supported Sitting balance-Leahy Scale: Good     Standing balance support: During functional activity;Single extremity supported Standing balance-Leahy Scale: Fair                               Pertinent Vitals/Pain Pain Assessment:  (Pt denies pain but endorses ongoing "heaviness" on chest that makes it difficult to breath, pt reports this is not new, nursing made aware.)    Home Living Family/patient expects to be discharged to:: Private residence Living Arrangements: Children (son with paraplegia (T1) who is mod I & driving) Available Help at Discharge: Family;Available PRN/intermittently Type of Home: House Home Access:  (3 steps or can use ramp)     Home Layout: One level Home Equipment: Cane - single point;Walker - 2 wheels      Prior Function Level of Independence: Independent         Comments: Pt reports using SPC PRN, otherwise no AD. Cooking, cleaning, driving.  Hand Dominance        Extremity/Trunk Assessment   Upper Extremity Assessment Upper Extremity Assessment: Overall WFL for tasks assessed    Lower Extremity Assessment Lower Extremity Assessment: Generalized weakness       Communication   Communication: HOH  Cognition Arousal/Alertness: Awake/alert Behavior During Therapy: WFL for tasks  assessed/performed Overall Cognitive Status: Within Functional Limits for tasks assessed                                 General Comments: Very pleasant lady willing to participate.      General Comments General comments (skin integrity, edema, etc.): Pt on room air throughout session, lowest SPO2 87% but quickly returns to 90% or greater. Pt left on room air at end of session. HR = 103 bpm sitting EOB, increases to 106 bpm with gait.    Exercises    Assessment/Plan    PT Assessment Patient needs continued PT services  PT Problem List Decreased strength;Decreased balance;Cardiopulmonary status limiting activity       PT Treatment Interventions DME instruction;Balance training;Functional mobility training;Patient/family education;Therapeutic activities;Gait training;Neuromuscular re-education;Stair training;Therapeutic exercise    PT Goals (Current goals can be found in the Care Plan section)  Acute Rehab PT Goals Patient Stated Goal: To return to PLOF PT Goal Formulation: With patient Time For Goal Achievement: 05/28/20 Potential to Achieve Goals: Good    Frequency Min 2X/week   Barriers to discharge        Co-evaluation               AM-PAC PT "6 Clicks" Mobility  Outcome Measure Help needed turning from your back to your side while in a flat bed without using bedrails?: None Help needed moving from lying on your back to sitting on the side of a flat bed without using bedrails?: None Help needed moving to and from a bed to a chair (including a wheelchair)?: A Little Help needed standing up from a chair using your arms (e.g., wheelchair or bedside chair)?: None Help needed to walk in hospital room?: A Little Help needed climbing 3-5 steps with a railing? : A Little 6 Click Score: 21    End of Session Equipment Utilized During Treatment: Gait belt Activity Tolerance: Patient tolerated treatment well Patient left: in chair;with chair alarm set;with  call bell/phone within reach Nurse Communication: Mobility status (O2, chest heaviness) PT Visit Diagnosis: Unsteadiness on feet (R26.81);Difficulty in walking, not elsewhere classified (R26.2);Muscle weakness (generalized) (M62.81)    Time: 2130-8657 PT Time Calculation (min) (ACUTE ONLY): 24 min   Charges:   PT Evaluation $PT Eval Low Complexity: 1 Low PT Treatments $Therapeutic Activity: 8-22 mins        Aleda Grana, PT, DPT 05/14/20, 3:00 PM   Sandi Mariscal 05/14/2020, 2:58 PM

## 2020-05-15 DIAGNOSIS — E440 Moderate protein-calorie malnutrition: Secondary | ICD-10-CM | POA: Insufficient documentation

## 2020-05-15 LAB — BASIC METABOLIC PANEL
Anion gap: 9 (ref 5–15)
BUN: 18 mg/dL (ref 8–23)
CO2: 26 mmol/L (ref 22–32)
Calcium: 8.8 mg/dL — ABNORMAL LOW (ref 8.9–10.3)
Chloride: 103 mmol/L (ref 98–111)
Creatinine, Ser: 1.06 mg/dL — ABNORMAL HIGH (ref 0.44–1.00)
GFR, Estimated: 52 mL/min — ABNORMAL LOW (ref >60)
Glucose, Bld: 90 mg/dL (ref 70–99)
Potassium: 3.9 mmol/L (ref 3.5–5.1)
Sodium: 138 mmol/L (ref 135–145)

## 2020-05-15 LAB — CBC WITH DIFFERENTIAL/PLATELET
Abs Immature Granulocytes: 0.04 10*3/uL (ref 0.00–0.07)
Basophils Absolute: 0 10*3/uL (ref 0.0–0.1)
Basophils Relative: 0 %
Eosinophils Absolute: 0.1 10*3/uL (ref 0.0–0.5)
Eosinophils Relative: 1 %
HCT: 32.4 % — ABNORMAL LOW (ref 36.0–46.0)
Hemoglobin: 10.9 g/dL — ABNORMAL LOW (ref 12.0–15.0)
Immature Granulocytes: 1 %
Lymphocytes Relative: 22 %
Lymphs Abs: 1.6 10*3/uL (ref 0.7–4.0)
MCH: 32.1 pg (ref 26.0–34.0)
MCHC: 33.6 g/dL (ref 30.0–36.0)
MCV: 95.3 fL (ref 80.0–100.0)
Monocytes Absolute: 0.9 10*3/uL (ref 0.1–1.0)
Monocytes Relative: 12 %
Neutro Abs: 4.7 10*3/uL (ref 1.7–7.7)
Neutrophils Relative %: 64 %
Platelets: 137 10*3/uL — ABNORMAL LOW (ref 150–400)
RBC: 3.4 MIL/uL — ABNORMAL LOW (ref 3.87–5.11)
RDW: 14.8 % (ref 11.5–15.5)
WBC: 7.3 10*3/uL (ref 4.0–10.5)
nRBC: 0 % (ref 0.0–0.2)

## 2020-05-15 LAB — MAGNESIUM: Magnesium: 1.8 mg/dL (ref 1.7–2.4)

## 2020-05-15 MED ORDER — CALCIUM CARBONATE ANTACID 500 MG PO CHEW: 1.0000 | CHEWABLE_TABLET | Freq: Two times a day (BID) | ORAL | Status: DC | PRN

## 2020-05-15 MED ORDER — DILTIAZEM HCL ER COATED BEADS 240 MG PO CP24: 240.0000 mg | ORAL_CAPSULE | Freq: Every day | ORAL | 0 refills | Status: AC

## 2020-05-15 NOTE — Discharge Summary (Signed)
Physician Discharge Summary  Patient ID: Sharon Bullock MRN: [id number] DOB/AGE: 1933/08/14 87 y.o.  Admit date: 05/13/2020 Discharge date: 05/15/2020  Admission Diagnoses:  Discharge Diagnoses:  Principal Problem:   Acute on chronic diastolic CHF (congestive heart failure) (HCC) Active Problems:   Paroxysmal A-fib (HCC)   Benign essential HTN   Iron deficiency anemia   Dyslipidemia   Gastric reflux   Asymmetric septal hypertrophy (HCC)   Acute respiratory failure with hypoxia (HCC)   CKD (chronic kidney disease), stage IIIa   Elevated troponin   Malnutrition of moderate degree   Discharged Condition: good  Hospital Course:  Sharon Bullock a 87 y.o.femalewith medical history significant ofdCHF,hypertension, hyperlipidemia, GERD, iron deficiency anemia, CKD stage III, mitral valve regurgitation, insomnia, vertigo, hard of hearing, bilateral carotid artery stenosis, atrial fibrillation on Pradaxa, symmetric septal hypertrophy and hypertrophic cardiomyopathy, who presents with shortness of breath. Patient is started on IV Lasix for acute on chronic diastolic congestive heart failure.  She was also requiring O2.  #1.  Acute hypoxemic respite failure secondary to acute on chronic diastolic congestive heart failure. Acute on chronic diastolic congestive heart failure secondary to hypertrophic cardiomyopathy. Hypertrophic cardiomyopathy. Elevated troponin secondary to congestive heart failure. Patient received IV Lasix, condition had improved.  Oxygen is off.  She is medically stable to be discharged.  Patient also seen by cardiology, added calcium channel blocker in addition to beta-blocker for hypertrophic cardiomyopathy.   #2.  Paroxysmal atrial fibrillation. Continue Pradaxa and Coreg.  3.  Essential hypertension. Continue home medicines.  4.  Chronic kidney disease stage IIIa. Stable.  #5 hypokalemia. Resumed Aldactone.   Consults: cardiology  Significant  Diagnostic Studies:   Echo Left ventricular ejection fraction, by estimation, is 50 to 55%. The left ventricle has low normal function. The left ventricle has no regional wall motion abnormalities. The left ventricular internal cavity size was mildly dilated. Left ventricular diastolic parameters are consistent with Grade II diastolic dysfunction (pseudonormalization). 2. Right ventricular systolic function is normal. The right ventricular size is normal. 3. Left atrial size was severely dilated. 4. A small pericardial effusion is present. The pericardial effusion is posterior to the left ventricle and the left atrium. 5. The mitral valve is normal in structure. Moderate to severe mitral valve regurgitation. 6. The aortic valve is normal in structure. Aortic valve regurgitation is trivial.  Treatments: Diurectics  Discharge Exam: Blood pressure (!) 92/56, pulse (!) 105, temperature 97.6 F (36.4 C), temperature source Oral, resp. rate 18, height 5\' 4"  (1.626 m), weight 67.3 kg, SpO2 97 %. General appearance: alert and cooperative Resp: clear to auscultation bilaterally Cardio: 2/6 SM at apex, regular GI: soft, non-tender; bowel sounds normal; no masses,  no organomegaly Extremities: extremities normal, atraumatic, no cyanosis or edema  Disposition: Discharge disposition: 01-Home or Self Care       Discharge Instructions    Diet - low sodium heart healthy   Complete by: As directed    Fluid restriction 1583ml/day   Increase activity slowly   Complete by: As directed      Allergies as of 05/15/2020      Reactions   Nickel Dermatitis, Swelling   Other Anaphylaxis   Uncoded Allergy. Allergen: SHELLFISH   Ace Inhibitors Cough   Xarelto [rivaroxaban]    Per patient blurred       Medication List    STOP taking these medications   amLODipine 10 MG tablet Commonly known as: NORVASC   predniSONE 20 MG tablet Commonly known as: DELTASONE  TAKE these medications    amiodarone 200 MG tablet Commonly known as: PACERONE Take 1 tablet by mouth daily.   carvedilol 25 MG tablet Commonly known as: COREG TAKE 1 TABLET(25 MG) BY MOUTH TWICE DAILY WITH A MEAL What changed: See the new instructions.   dabigatran 150 MG Caps capsule Commonly known as: PRADAXA Take 150 mg by mouth 2 (two) times daily.   diltiazem 240 MG 24 hr capsule Commonly known as: CARDIZEM CD Take 1 capsule (240 mg total) by mouth daily. Start taking on: May 16, 2020   ferrous gluconate 324 MG tablet Commonly known as: FERGON Take 324 mg by mouth daily with breakfast.   folic acid 400 MCG tablet Commonly known as: FOLVITE Take 400 mcg by mouth daily.   irbesartan 300 MG tablet Commonly known as: AVAPRO Take 1 tablet (300 mg total) by mouth daily.   levocetirizine 5 MG tablet Commonly known as: XYZAL TAKE 1 TABLET(5 MG) BY MOUTH EVERY EVENING What changed: See the new instructions.   omeprazole 40 MG capsule Commonly known as: PRILOSEC TAKE 1 CAPSULE(40 MG) BY MOUTH DAILY What changed: See the new instructions.   rosuvastatin 10 MG tablet Commonly known as: CRESTOR Take 1 tablet (10 mg total) by mouth daily.   solifenacin 5 MG tablet Commonly known as: VESIcare Take 1 tablet (5 mg total) by mouth daily.   spironolactone 25 MG tablet Commonly known as: ALDACTONE Take 1 tablet by mouth daily.   Vitamin D (Cholecalciferol) 25 MCG (1000 UT) Tabs Take 1,000 Units by mouth daily.   zolpidem 5 MG tablet Commonly known as: AMBIEN Take 1 tablet (5 mg total) by mouth at bedtime.       Follow-up Information    [facility] Follow up on 05/28/2020.   Specialty: Cardiology Why: at 2:00pm. Enter through the Medical Mall entrance Contact information: [address] [place] [address] [phone]       Alba Cory, MD Follow up in 1 week(s).   Specialty: Family Medicine Contact  information: [address] [place] [address] [phone]               Signed: Marrion Coy 05/15/2020, 9:44 AM

## 2020-05-15 NOTE — TOC Transition Note (Signed)
Transition of Care [facility]) - CM/SW Discharge Note   Patient Details  Name: Sharon Bullock MRN: [id number] Date of Birth: [birth year]  [facility]) CM/SW Contact:  Hetty Ely, RN Phone Number: 05/15/2020, 10:26 AM   Clinical Narrative:  Patient to be discharged with rolling walker and HH Services, voices understanding and agree to home services.     Final next level of care: Home w Home Health Services Barriers to Discharge: Barriers Resolved   Patient Goals and CMS Choice Patient states their goals for this hospitalization and ongoing recovery are:: To return home.   Choice offered to / list presented to : NA  Discharge Placement                Patient to be transferred to facility by: Patient to arrange transport to home. Name of family member notified: Patient to call. Patient and family notified of of transfer: 05/15/20  Discharge Plan and Services In-house Referral: NA Discharge Planning Services: NA Post Acute Care Choice: NA          DME Arranged: Dan Humphreys rolling DME Agency: [employer] Date DME Agency Contacted: 05/15/20 Time DME Agency Contacted: 1025 Representative spoke with at DME Agency: Launa Grill HH Arranged: OT,PT,RN HH Agency: [facility] Date HH Agency Contacted: 05/15/20 Time HH Agency Contacted: 1025 Representative spoke with at [facility] Agency: Feliberto Gottron  Social Determinants of Health (SDOH) Interventions     Readmission Risk Interventions No flowsheet data found.

## 2020-05-16 ENCOUNTER — Telehealth: Payer: Self-pay

## 2020-05-16 NOTE — Telephone Encounter (Signed)
Transition Care Management Follow-up Telephone Call  Date of discharge and from where: 05/15/20 [facility]  How have you been since you were released from the hospital? Pt still feeling weak, not much appetite. Pt also c/o heart burn and pain after eating. Patient resting at time of call and spoke to daughter Sreshta Cressler.   Any questions or concerns? No  Items Reviewed:  Did the pt receive and understand the discharge instructions provided? Yes   Medications obtained and verified? Yes   Other? No   Any new allergies since your discharge? No   Dietary orders reviewed? Yes  Do you have support at home? Yes   Home Care and Equipment/Supplies: Were home health services ordered? Yes If so, what is the name of the agency? [facility]  Has the agency set up a time to come to the patient's home? no Were any new equipment or medical supplies ordered?  Yes: walker with 5" wheels What is the name of the medical supply agency? [facility] Were you able to get the supplies/equipment? yes Do you have any questions related to the use of the equipment or supplies? No  Functional Questionnaire: (I = Independent and D = Dependent) ADLs: I  Bathing/Dressing- I  Meal Prep- D   Eating- I  Maintaining continence- I  Transferring/Ambulation- I with assistance  Managing Meds- I  Follow up appointments reviewed:   PCP Hospital f/u appt confirmed? Yes  Scheduled to see Dr. Carlynn Purl on 06/26/20; msg sent to Dr. Carlynn Purl for possible earlier appt. Marland Kitchen  Specialist Hospital f/u appt confirmed? Yes  Scheduled to see Clarisa Kindred at [facility] on 05/28/20.  Are transportation arrangements needed? No   If their condition worsens, is the pt aware to call PCP or go to the Emergency Dept.? Yes  Was the patient provided with contact information for the PCP's office or ED? Yes  Was to pt encouraged to call back with questions or concerns? Yes

## 2020-05-17 ENCOUNTER — Telehealth: Payer: Self-pay | Admitting: Family Medicine

## 2020-05-17 NOTE — Telephone Encounter (Signed)
Tiffany Patient Care Manager is calling to request verbal orders for the patient approval to start Home Health on February 4. 2022. Please advise CB- [phone] Option 2

## 2020-05-18 ENCOUNTER — Telehealth: Payer: Self-pay

## 2020-05-18 DIAGNOSIS — I6523 Occlusion and stenosis of bilateral carotid arteries: Secondary | ICD-10-CM | POA: Diagnosis not present

## 2020-05-18 DIAGNOSIS — J9601 Acute respiratory failure with hypoxia: Secondary | ICD-10-CM | POA: Diagnosis not present

## 2020-05-18 DIAGNOSIS — Z9181 History of falling: Secondary | ICD-10-CM | POA: Diagnosis not present

## 2020-05-18 DIAGNOSIS — I422 Other hypertrophic cardiomyopathy: Secondary | ICD-10-CM | POA: Diagnosis not present

## 2020-05-18 DIAGNOSIS — I5033 Acute on chronic diastolic (congestive) heart failure: Secondary | ICD-10-CM | POA: Diagnosis not present

## 2020-05-18 DIAGNOSIS — G4700 Insomnia, unspecified: Secondary | ICD-10-CM | POA: Diagnosis not present

## 2020-05-18 DIAGNOSIS — E876 Hypokalemia: Secondary | ICD-10-CM | POA: Diagnosis not present

## 2020-05-18 DIAGNOSIS — N1831 Chronic kidney disease, stage 3a: Secondary | ICD-10-CM | POA: Diagnosis not present

## 2020-05-18 DIAGNOSIS — T7840XD Allergy, unspecified, subsequent encounter: Secondary | ICD-10-CM | POA: Diagnosis not present

## 2020-05-18 DIAGNOSIS — I480 Paroxysmal atrial fibrillation: Secondary | ICD-10-CM | POA: Diagnosis not present

## 2020-05-18 DIAGNOSIS — E785 Hyperlipidemia, unspecified: Secondary | ICD-10-CM | POA: Diagnosis not present

## 2020-05-18 DIAGNOSIS — I051 Rheumatic mitral insufficiency: Secondary | ICD-10-CM | POA: Diagnosis not present

## 2020-05-18 DIAGNOSIS — R42 Dizziness and giddiness: Secondary | ICD-10-CM | POA: Diagnosis not present

## 2020-05-18 DIAGNOSIS — I130 Hypertensive heart and chronic kidney disease with heart failure and stage 1 through stage 4 chronic kidney disease, or unspecified chronic kidney disease: Secondary | ICD-10-CM | POA: Diagnosis not present

## 2020-05-18 DIAGNOSIS — E440 Moderate protein-calorie malnutrition: Secondary | ICD-10-CM | POA: Diagnosis not present

## 2020-05-18 DIAGNOSIS — D509 Iron deficiency anemia, unspecified: Secondary | ICD-10-CM | POA: Diagnosis not present

## 2020-05-18 DIAGNOSIS — K219 Gastro-esophageal reflux disease without esophagitis: Secondary | ICD-10-CM | POA: Diagnosis not present

## 2020-05-18 DIAGNOSIS — H9190 Unspecified hearing loss, unspecified ear: Secondary | ICD-10-CM | POA: Diagnosis not present

## 2020-05-18 NOTE — Telephone Encounter (Signed)
Verbal orders given  

## 2020-05-18 NOTE — Telephone Encounter (Signed)
   Post Hospital call to patient.  Spoke with Sharon Bullock, she states she is getting stronger.  She is eating better, not having the pain in her stomach.  Went over her medications list- on discharge the amlodipine 10 mg was to be discontinued but she has continued to take.  Spelled the medication and also gave her the other name for it--Norvasc..  Again told her that was to be stopped -she voices understanding.  She wants to cancel the appointment with the outpatient heart failure clinic, she follows with a heart doctor at [facility].  She has not started to weigh herself daily, but will start.  Also states she left her robe on the bed when she was discharged.  Instructed would check lost and found, was unable to locate and called her back.   Tresa Endo RN, CHFN

## 2020-05-18 NOTE — Telephone Encounter (Signed)
Copied from CRM [id number]. Topic: Quick Communication - Home Health Verbal Orders >> May 18, 2020  1:53 PM Gaetana Michaelis A wrote: Caller/Agency: Lawson Fiscal / [facility]  Callback Number: [phone]  Requesting OT/PT/Skilled Nursing/Social Work/Speech Therapy: Nursing  Frequency: 1w1 2w3 1w5 and 2prn

## 2020-05-21 ENCOUNTER — Telehealth: Payer: Self-pay | Admitting: Family Medicine

## 2020-05-21 DIAGNOSIS — I422 Other hypertrophic cardiomyopathy: Secondary | ICD-10-CM | POA: Diagnosis not present

## 2020-05-21 DIAGNOSIS — I480 Paroxysmal atrial fibrillation: Secondary | ICD-10-CM | POA: Diagnosis not present

## 2020-05-21 DIAGNOSIS — J9601 Acute respiratory failure with hypoxia: Secondary | ICD-10-CM | POA: Diagnosis not present

## 2020-05-21 DIAGNOSIS — I130 Hypertensive heart and chronic kidney disease with heart failure and stage 1 through stage 4 chronic kidney disease, or unspecified chronic kidney disease: Secondary | ICD-10-CM | POA: Diagnosis not present

## 2020-05-21 DIAGNOSIS — I5033 Acute on chronic diastolic (congestive) heart failure: Secondary | ICD-10-CM | POA: Diagnosis not present

## 2020-05-21 DIAGNOSIS — N1831 Chronic kidney disease, stage 3a: Secondary | ICD-10-CM | POA: Diagnosis not present

## 2020-05-21 NOTE — Telephone Encounter (Signed)
Requesting verbal orders for PT, 2xwk 2, 1xwk 2.  If there are any questions, please call [phone]

## 2020-05-22 NOTE — Telephone Encounter (Signed)
Verbal orders given  

## 2020-05-23 DIAGNOSIS — N1831 Chronic kidney disease, stage 3a: Secondary | ICD-10-CM | POA: Diagnosis not present

## 2020-05-23 DIAGNOSIS — I5033 Acute on chronic diastolic (congestive) heart failure: Secondary | ICD-10-CM | POA: Diagnosis not present

## 2020-05-23 DIAGNOSIS — I130 Hypertensive heart and chronic kidney disease with heart failure and stage 1 through stage 4 chronic kidney disease, or unspecified chronic kidney disease: Secondary | ICD-10-CM | POA: Diagnosis not present

## 2020-05-23 DIAGNOSIS — I422 Other hypertrophic cardiomyopathy: Secondary | ICD-10-CM | POA: Diagnosis not present

## 2020-05-23 DIAGNOSIS — J9601 Acute respiratory failure with hypoxia: Secondary | ICD-10-CM | POA: Diagnosis not present

## 2020-05-23 DIAGNOSIS — I480 Paroxysmal atrial fibrillation: Secondary | ICD-10-CM | POA: Diagnosis not present

## 2020-05-25 DIAGNOSIS — J9601 Acute respiratory failure with hypoxia: Secondary | ICD-10-CM | POA: Diagnosis not present

## 2020-05-25 DIAGNOSIS — N1831 Chronic kidney disease, stage 3a: Secondary | ICD-10-CM | POA: Diagnosis not present

## 2020-05-25 DIAGNOSIS — I5033 Acute on chronic diastolic (congestive) heart failure: Secondary | ICD-10-CM | POA: Diagnosis not present

## 2020-05-25 DIAGNOSIS — I130 Hypertensive heart and chronic kidney disease with heart failure and stage 1 through stage 4 chronic kidney disease, or unspecified chronic kidney disease: Secondary | ICD-10-CM | POA: Diagnosis not present

## 2020-05-25 DIAGNOSIS — I480 Paroxysmal atrial fibrillation: Secondary | ICD-10-CM | POA: Diagnosis not present

## 2020-05-25 DIAGNOSIS — I422 Other hypertrophic cardiomyopathy: Secondary | ICD-10-CM | POA: Diagnosis not present

## 2020-05-28 ENCOUNTER — Telehealth: Payer: Self-pay | Admitting: Family Medicine

## 2020-05-28 ENCOUNTER — Ambulatory Visit: Payer: Medicare Other | Admitting: Family

## 2020-05-28 NOTE — Telephone Encounter (Signed)
Sharon Bullock, calling from [facility], called stating that the pts daughter has declined OT evaluation. Please advise.    [phone]

## 2020-05-29 DIAGNOSIS — I422 Other hypertrophic cardiomyopathy: Secondary | ICD-10-CM | POA: Diagnosis not present

## 2020-05-29 DIAGNOSIS — I5033 Acute on chronic diastolic (congestive) heart failure: Secondary | ICD-10-CM | POA: Diagnosis not present

## 2020-05-29 DIAGNOSIS — I480 Paroxysmal atrial fibrillation: Secondary | ICD-10-CM | POA: Diagnosis not present

## 2020-05-29 DIAGNOSIS — I130 Hypertensive heart and chronic kidney disease with heart failure and stage 1 through stage 4 chronic kidney disease, or unspecified chronic kidney disease: Secondary | ICD-10-CM | POA: Diagnosis not present

## 2020-05-29 DIAGNOSIS — J9601 Acute respiratory failure with hypoxia: Secondary | ICD-10-CM | POA: Diagnosis not present

## 2020-05-29 DIAGNOSIS — N1831 Chronic kidney disease, stage 3a: Secondary | ICD-10-CM | POA: Diagnosis not present

## 2020-05-31 DIAGNOSIS — N1831 Chronic kidney disease, stage 3a: Secondary | ICD-10-CM | POA: Diagnosis not present

## 2020-05-31 DIAGNOSIS — I422 Other hypertrophic cardiomyopathy: Secondary | ICD-10-CM | POA: Diagnosis not present

## 2020-05-31 DIAGNOSIS — I130 Hypertensive heart and chronic kidney disease with heart failure and stage 1 through stage 4 chronic kidney disease, or unspecified chronic kidney disease: Secondary | ICD-10-CM | POA: Diagnosis not present

## 2020-05-31 DIAGNOSIS — I5033 Acute on chronic diastolic (congestive) heart failure: Secondary | ICD-10-CM | POA: Diagnosis not present

## 2020-05-31 DIAGNOSIS — I480 Paroxysmal atrial fibrillation: Secondary | ICD-10-CM | POA: Diagnosis not present

## 2020-05-31 DIAGNOSIS — J9601 Acute respiratory failure with hypoxia: Secondary | ICD-10-CM | POA: Diagnosis not present

## 2020-06-01 DIAGNOSIS — J9601 Acute respiratory failure with hypoxia: Secondary | ICD-10-CM | POA: Diagnosis not present

## 2020-06-01 DIAGNOSIS — I480 Paroxysmal atrial fibrillation: Secondary | ICD-10-CM | POA: Diagnosis not present

## 2020-06-01 DIAGNOSIS — I5033 Acute on chronic diastolic (congestive) heart failure: Secondary | ICD-10-CM | POA: Diagnosis not present

## 2020-06-01 DIAGNOSIS — I130 Hypertensive heart and chronic kidney disease with heart failure and stage 1 through stage 4 chronic kidney disease, or unspecified chronic kidney disease: Secondary | ICD-10-CM | POA: Diagnosis not present

## 2020-06-01 DIAGNOSIS — N1831 Chronic kidney disease, stage 3a: Secondary | ICD-10-CM | POA: Diagnosis not present

## 2020-06-01 DIAGNOSIS — I422 Other hypertrophic cardiomyopathy: Secondary | ICD-10-CM | POA: Diagnosis not present

## 2020-06-05 DIAGNOSIS — I5033 Acute on chronic diastolic (congestive) heart failure: Secondary | ICD-10-CM | POA: Diagnosis not present

## 2020-06-05 DIAGNOSIS — I480 Paroxysmal atrial fibrillation: Secondary | ICD-10-CM | POA: Diagnosis not present

## 2020-06-05 DIAGNOSIS — I422 Other hypertrophic cardiomyopathy: Secondary | ICD-10-CM | POA: Diagnosis not present

## 2020-06-05 DIAGNOSIS — N1831 Chronic kidney disease, stage 3a: Secondary | ICD-10-CM | POA: Diagnosis not present

## 2020-06-05 DIAGNOSIS — J9601 Acute respiratory failure with hypoxia: Secondary | ICD-10-CM | POA: Diagnosis not present

## 2020-06-05 DIAGNOSIS — I130 Hypertensive heart and chronic kidney disease with heart failure and stage 1 through stage 4 chronic kidney disease, or unspecified chronic kidney disease: Secondary | ICD-10-CM | POA: Diagnosis not present

## 2020-06-06 DIAGNOSIS — I480 Paroxysmal atrial fibrillation: Secondary | ICD-10-CM | POA: Diagnosis not present

## 2020-06-06 DIAGNOSIS — N1831 Chronic kidney disease, stage 3a: Secondary | ICD-10-CM | POA: Diagnosis not present

## 2020-06-06 DIAGNOSIS — I5033 Acute on chronic diastolic (congestive) heart failure: Secondary | ICD-10-CM | POA: Diagnosis not present

## 2020-06-06 DIAGNOSIS — I422 Other hypertrophic cardiomyopathy: Secondary | ICD-10-CM | POA: Diagnosis not present

## 2020-06-06 DIAGNOSIS — I130 Hypertensive heart and chronic kidney disease with heart failure and stage 1 through stage 4 chronic kidney disease, or unspecified chronic kidney disease: Secondary | ICD-10-CM | POA: Diagnosis not present

## 2020-06-06 DIAGNOSIS — J9601 Acute respiratory failure with hypoxia: Secondary | ICD-10-CM | POA: Diagnosis not present

## 2020-06-08 ENCOUNTER — Telehealth: Payer: Self-pay

## 2020-06-08 DIAGNOSIS — I422 Other hypertrophic cardiomyopathy: Secondary | ICD-10-CM | POA: Diagnosis not present

## 2020-06-08 DIAGNOSIS — J9601 Acute respiratory failure with hypoxia: Secondary | ICD-10-CM | POA: Diagnosis not present

## 2020-06-08 DIAGNOSIS — I130 Hypertensive heart and chronic kidney disease with heart failure and stage 1 through stage 4 chronic kidney disease, or unspecified chronic kidney disease: Secondary | ICD-10-CM | POA: Diagnosis not present

## 2020-06-08 DIAGNOSIS — I480 Paroxysmal atrial fibrillation: Secondary | ICD-10-CM | POA: Diagnosis not present

## 2020-06-08 DIAGNOSIS — N1831 Chronic kidney disease, stage 3a: Secondary | ICD-10-CM | POA: Diagnosis not present

## 2020-06-08 DIAGNOSIS — I5033 Acute on chronic diastolic (congestive) heart failure: Secondary | ICD-10-CM | POA: Diagnosis not present

## 2020-06-08 NOTE — Progress Notes (Signed)
Chronic Care Management Pharmacy Assistant   Name: Sharon Bullock  MRN: [id number] DOB: 16-Aug-1933  Reason for Encounter: Medication Review /General Adherence    PCP : Alba Cory, MD  Allergies:   Allergies  Allergen Reactions  . Nickel Dermatitis and Swelling  . Other Anaphylaxis    Uncoded Allergy. Allergen: SHELLFISH  . Ace Inhibitors Cough  . Xarelto [Rivaroxaban]     Per patient blurred     Medications: Outpatient Encounter Medications as of 06/08/2020  Medication Sig  . amiodarone (PACERONE) 200 MG tablet Take 1 tablet by mouth daily.  . carvedilol (COREG) 25 MG tablet TAKE 1 TABLET(25 MG) BY MOUTH TWICE DAILY WITH A MEAL (Patient taking differently: Take 25 mg by mouth 2 (two) times daily with a meal. TAKE 1 TABLET(25 MG) BY MOUTH TWICE DAILY WITH A MEAL)  . dabigatran (PRADAXA) 150 MG CAPS capsule Take 150 mg by mouth 2 (two) times daily.  Marland Kitchen diltiazem (CARDIZEM CD) 240 MG 24 hr capsule Take 1 capsule (240 mg total) by mouth daily.  . ferrous gluconate (FERGON) 324 MG tablet Take 324 mg by mouth daily with breakfast.  . folic acid (FOLVITE) 400 MCG tablet Take 400 mcg by mouth daily.   . irbesartan (AVAPRO) 300 MG tablet Take 1 tablet (300 mg total) by mouth daily.  Marland Kitchen levocetirizine (XYZAL) 5 MG tablet TAKE 1 TABLET(5 MG) BY MOUTH EVERY EVENING (Patient taking differently: Take 5 mg by mouth every evening.)  . omeprazole (PRILOSEC) 40 MG capsule TAKE 1 CAPSULE(40 MG) BY MOUTH DAILY (Patient taking differently: Take 40 mg by mouth daily.)  . rosuvastatin (CRESTOR) 10 MG tablet Take 1 tablet (10 mg total) by mouth daily.  . solifenacin (VESICARE) 5 MG tablet Take 1 tablet (5 mg total) by mouth daily.  Marland Kitchen spironolactone (ALDACTONE) 25 MG tablet Take 1 tablet by mouth daily. (Patient not taking: Reported on 05/13/2020)  . Vitamin D, Cholecalciferol, 1000 UNITS TABS Take 1,000 Units by mouth daily.   Marland Kitchen zolpidem (AMBIEN) 5 MG tablet Take 1 tablet (5 mg total) by mouth at  bedtime.   Facility-Administered Encounter Medications as of 06/08/2020  Medication  . cyanocobalamin ((VITAMIN B-12)) injection 1,000 mcg    Current Diagnosis: Patient Active Problem List   Diagnosis Date Noted  . Malnutrition of moderate degree 05/15/2020  . Acute on chronic diastolic CHF (congestive heart failure) (HCC) 05/13/2020  . CKD (chronic kidney disease), stage IIIa 05/13/2020  . Elevated troponin 05/13/2020  . Pericardial effusion 04/18/2019  . Acute respiratory failure with hypoxia (HCC) 11/30/2018  . Moderate major depression, single episode (HCC) 08/03/2018  . Thrombocytopenia (HCC) 05/27/2016  . CHF NYHA class I, chronic, diastolic (HCC) 05/26/2016  . Mesenteric vascular insufficiency (HCC) 02/28/2016  . Pancreas cyst 02/28/2016  . Increased endometrial stripe thickness 02/28/2016  . Carotid atherosclerosis, bilateral 01/23/2016  . Hearing loss 08/17/2015  . Mitral regurgitation 04/19/2015  . Pernicious anemia 09/27/2014  . Paroxysmal A-fib (HCC) 09/27/2014  . Benign essential HTN 09/27/2014  . Perennial allergic rhinitis 09/27/2014  . Iron deficiency anemia 09/27/2014  . Insomnia, persistent 09/27/2014  . Hypertensive pulmonary vascular disease (HCC) 09/27/2014  . B12 deficiency 09/27/2014  . Diverticulosis of colon 09/27/2014  . Dyslipidemia 09/27/2014  . Edema extremities 09/27/2014  . Gastric reflux 09/27/2014  . Bilateral hearing loss 09/27/2014  . Polypharmacy 09/27/2014  . Pelvic pain in female 09/27/2014  . MI (mitral incompetence) 09/27/2014  . Internal hemorrhoids 09/27/2014  . Osteopenia 09/27/2014  . Arthralgia  of shoulder 09/27/2014  . Finger tendinitis 09/27/2014  . Urge incontinence 09/27/2014  . Cervical prolapse 09/27/2014  . Vertigo 09/27/2014  . Vitamin D deficiency 09/27/2014  . Bladder cystocele 04/15/2012  . Asymmetric septal hypertrophy (HCC) 05/19/2011  . Hypertrophic cardiomyopathy (HCC) 05/19/2011    Goals Addressed    None    Called patient and discussed medication adherence  with patient, no issues at this time with current medication.   Patient reports ED visit since her last CPP follow up.   05/13/2020 ED Acute respiratory failure.  04/10/2020 ED Shortness of breath. Patient denies any side effects with her medication. Patient denies any problems with her current pharmacy    Patient states she is still "short whinnied". Patient states the medication she was prescribe (diltiazem 240 mg Daily) helps a little bit but not that much.Patient reports she has follow up with her cardiology soon.  Patient states her blood pressure is doing"Fine". Follow-Up:  Pharmacist Review   Everlean Cherry Clinical Pharmacist Assistant [phone]

## 2020-06-15 DIAGNOSIS — N1831 Chronic kidney disease, stage 3a: Secondary | ICD-10-CM | POA: Diagnosis not present

## 2020-06-15 DIAGNOSIS — I480 Paroxysmal atrial fibrillation: Secondary | ICD-10-CM | POA: Diagnosis not present

## 2020-06-15 DIAGNOSIS — J9601 Acute respiratory failure with hypoxia: Secondary | ICD-10-CM | POA: Diagnosis not present

## 2020-06-15 DIAGNOSIS — I422 Other hypertrophic cardiomyopathy: Secondary | ICD-10-CM | POA: Diagnosis not present

## 2020-06-15 DIAGNOSIS — I130 Hypertensive heart and chronic kidney disease with heart failure and stage 1 through stage 4 chronic kidney disease, or unspecified chronic kidney disease: Secondary | ICD-10-CM | POA: Diagnosis not present

## 2020-06-15 DIAGNOSIS — I5033 Acute on chronic diastolic (congestive) heart failure: Secondary | ICD-10-CM | POA: Diagnosis not present

## 2020-06-16 ENCOUNTER — Other Ambulatory Visit: Payer: Self-pay | Admitting: Family Medicine

## 2020-06-16 DIAGNOSIS — I10 Essential (primary) hypertension: Secondary | ICD-10-CM

## 2020-06-17 DIAGNOSIS — T7840XD Allergy, unspecified, subsequent encounter: Secondary | ICD-10-CM | POA: Diagnosis not present

## 2020-06-17 DIAGNOSIS — I5033 Acute on chronic diastolic (congestive) heart failure: Secondary | ICD-10-CM | POA: Diagnosis not present

## 2020-06-17 DIAGNOSIS — Z9181 History of falling: Secondary | ICD-10-CM | POA: Diagnosis not present

## 2020-06-17 DIAGNOSIS — N1831 Chronic kidney disease, stage 3a: Secondary | ICD-10-CM | POA: Diagnosis not present

## 2020-06-17 DIAGNOSIS — K219 Gastro-esophageal reflux disease without esophagitis: Secondary | ICD-10-CM | POA: Diagnosis not present

## 2020-06-17 DIAGNOSIS — I422 Other hypertrophic cardiomyopathy: Secondary | ICD-10-CM | POA: Diagnosis not present

## 2020-06-17 DIAGNOSIS — E785 Hyperlipidemia, unspecified: Secondary | ICD-10-CM | POA: Diagnosis not present

## 2020-06-17 DIAGNOSIS — R42 Dizziness and giddiness: Secondary | ICD-10-CM | POA: Diagnosis not present

## 2020-06-17 DIAGNOSIS — J9601 Acute respiratory failure with hypoxia: Secondary | ICD-10-CM | POA: Diagnosis not present

## 2020-06-17 DIAGNOSIS — E876 Hypokalemia: Secondary | ICD-10-CM | POA: Diagnosis not present

## 2020-06-17 DIAGNOSIS — D509 Iron deficiency anemia, unspecified: Secondary | ICD-10-CM | POA: Diagnosis not present

## 2020-06-17 DIAGNOSIS — G4700 Insomnia, unspecified: Secondary | ICD-10-CM | POA: Diagnosis not present

## 2020-06-17 DIAGNOSIS — I6523 Occlusion and stenosis of bilateral carotid arteries: Secondary | ICD-10-CM | POA: Diagnosis not present

## 2020-06-17 DIAGNOSIS — I480 Paroxysmal atrial fibrillation: Secondary | ICD-10-CM | POA: Diagnosis not present

## 2020-06-17 DIAGNOSIS — I051 Rheumatic mitral insufficiency: Secondary | ICD-10-CM | POA: Diagnosis not present

## 2020-06-17 DIAGNOSIS — E440 Moderate protein-calorie malnutrition: Secondary | ICD-10-CM | POA: Diagnosis not present

## 2020-06-17 DIAGNOSIS — I130 Hypertensive heart and chronic kidney disease with heart failure and stage 1 through stage 4 chronic kidney disease, or unspecified chronic kidney disease: Secondary | ICD-10-CM | POA: Diagnosis not present

## 2020-06-17 DIAGNOSIS — H9190 Unspecified hearing loss, unspecified ear: Secondary | ICD-10-CM | POA: Diagnosis not present

## 2020-06-19 DIAGNOSIS — I130 Hypertensive heart and chronic kidney disease with heart failure and stage 1 through stage 4 chronic kidney disease, or unspecified chronic kidney disease: Secondary | ICD-10-CM | POA: Diagnosis not present

## 2020-06-19 DIAGNOSIS — I480 Paroxysmal atrial fibrillation: Secondary | ICD-10-CM | POA: Diagnosis not present

## 2020-06-19 DIAGNOSIS — J9601 Acute respiratory failure with hypoxia: Secondary | ICD-10-CM | POA: Diagnosis not present

## 2020-06-19 DIAGNOSIS — I422 Other hypertrophic cardiomyopathy: Secondary | ICD-10-CM | POA: Diagnosis not present

## 2020-06-19 DIAGNOSIS — N1831 Chronic kidney disease, stage 3a: Secondary | ICD-10-CM | POA: Diagnosis not present

## 2020-06-19 DIAGNOSIS — I5033 Acute on chronic diastolic (congestive) heart failure: Secondary | ICD-10-CM | POA: Diagnosis not present

## 2020-06-20 ENCOUNTER — Other Ambulatory Visit: Payer: Self-pay | Admitting: Family Medicine

## 2020-06-20 DIAGNOSIS — I10 Essential (primary) hypertension: Secondary | ICD-10-CM

## 2020-06-22 ENCOUNTER — Ambulatory Visit: Payer: Medicare Other | Admitting: Family Medicine

## 2020-06-26 ENCOUNTER — Inpatient Hospital Stay: Payer: Medicare Other | Admitting: Family Medicine

## 2020-06-28 ENCOUNTER — Telehealth: Payer: Self-pay | Admitting: *Deleted

## 2020-07-13 NOTE — Telephone Encounter (Signed)
Coporal Scoggins from the [employer] called stating they were called out to the pt's home by her son; the pt was found deceased; Corporal Scoggins says the Medical Examiner has been contacted; he would like to know if Dr Carlynn Purl will be signing the death certificate; he can be contacted at [phone]; will route to office high priority for final disposition.

## 2020-07-13 DEATH — deceased

## 2021-02-19 ENCOUNTER — Ambulatory Visit: Payer: Medicare Other

## 2021-06-17 IMAGING — CT CT ANGIO CHEST
2 of 6 series · 18 of 46 positions shown · IV contrast (omnipaque)
Comparison: None.

CLINICAL DATA: Shortness of breath.

EXAM:
CT ANGIOGRAPHY CHEST WITH CONTRAST
TECHNIQUE: Multidetector CT imaging of the chest was performed using the
standard protocol during bolus administration of intravenous
contrast. Multiplanar CT image reconstructions and MIPs were
obtained to evaluate the vascular anatomy.
CONTRAST:  75mL OMNIPAQUE IOHEXOL 350 MG/ML SOLN

[Series 5: thins · axial · 0.54mm/px · z∈[-786,-548]mm · 15 of 261 slices shown]
[im 12/261  lung]
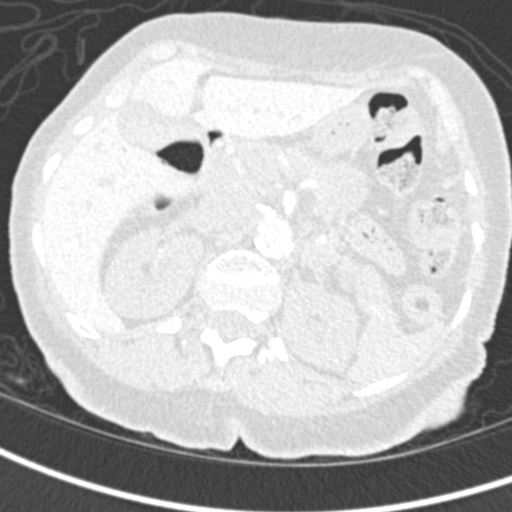
[im 34/261  soft-tissue]
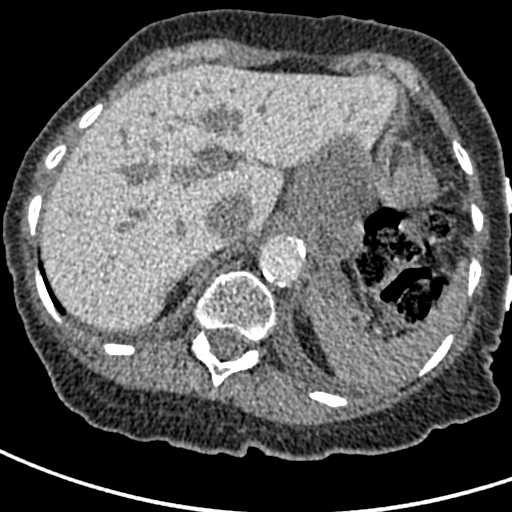
[im 46/261  lung]
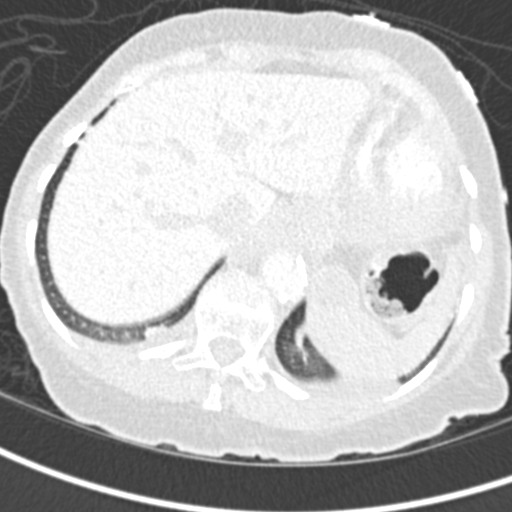
[im 68/261  soft-tissue]
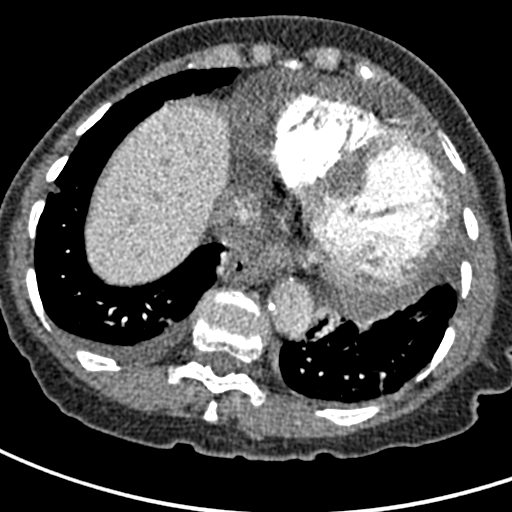
[im 80/261  lung]
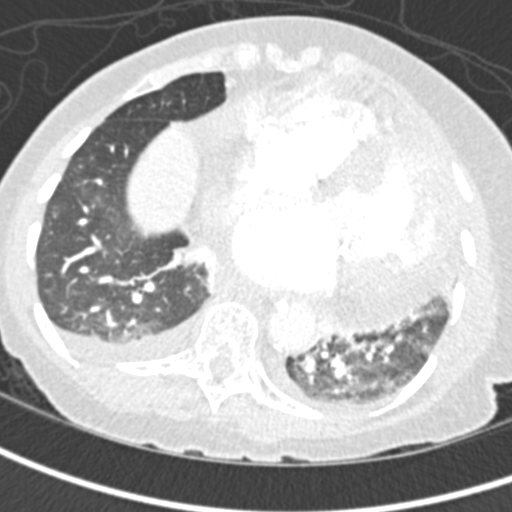
[im 102/261  soft-tissue]
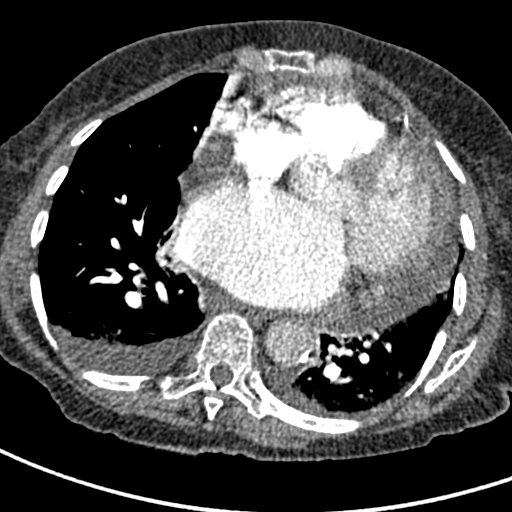
[im 114/261  lung]
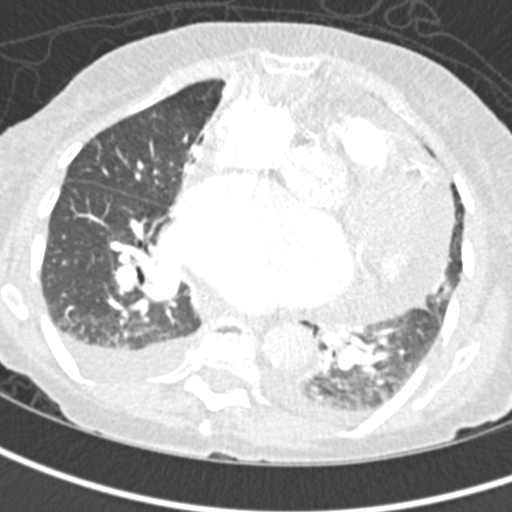
[im 136/261  soft-tissue]
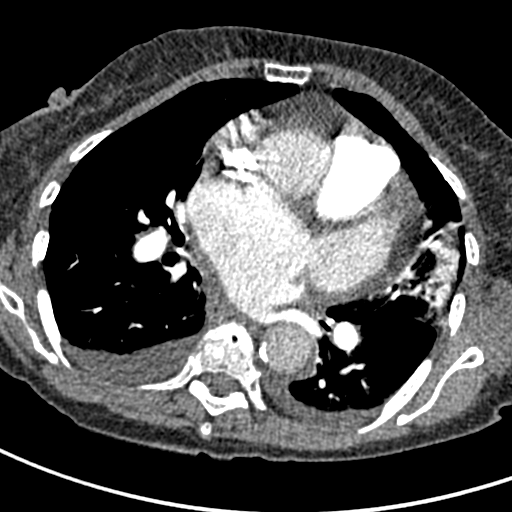
[im 147/261  lung]
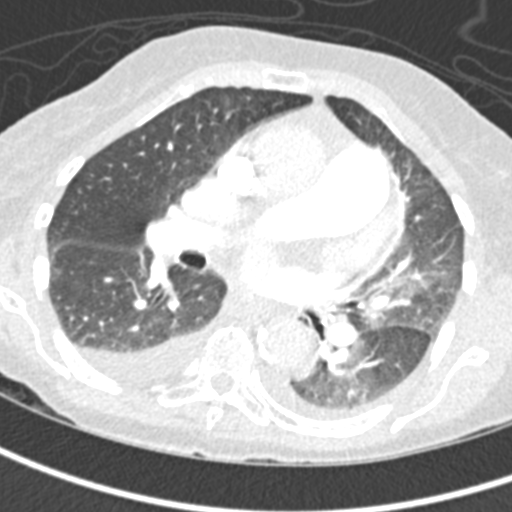
[im 159/261  soft-tissue]
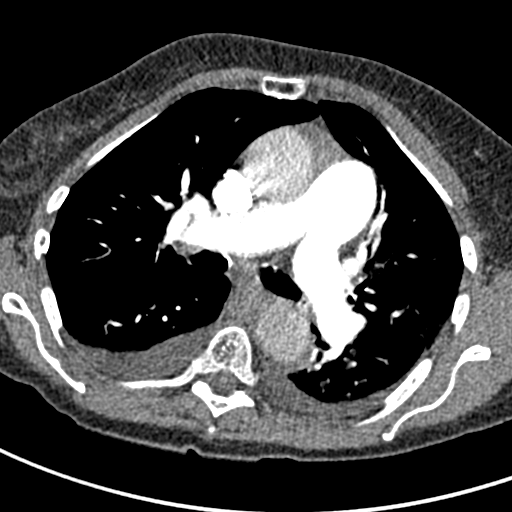
[im 181/261  lung]
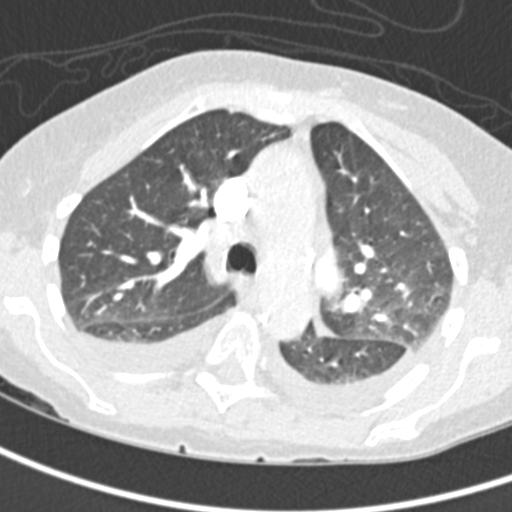
[im 193/261  soft-tissue]
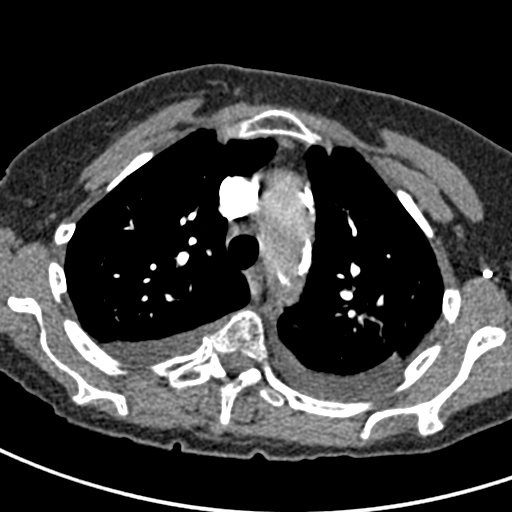
[im 215/261  lung]
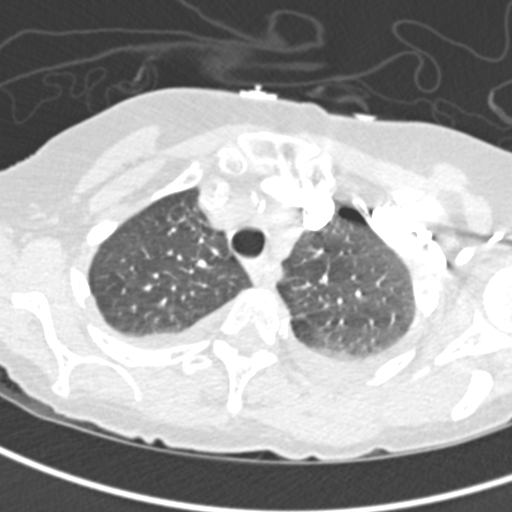
[im 227/261  soft-tissue]
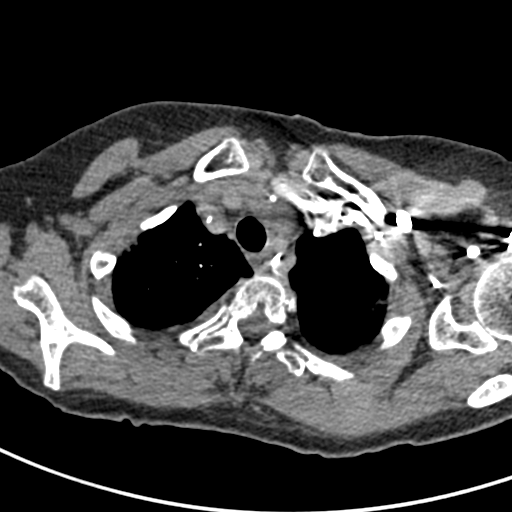
[im 249/261  lung]
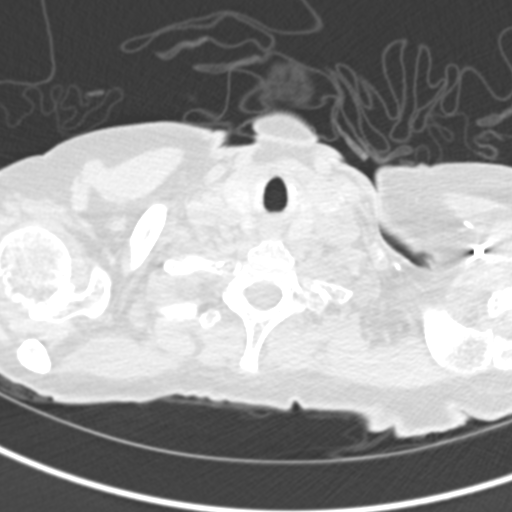

[Series 7: coronal mpr · coronal · 0.51mm/px · 3 of 79 slices shown]
[im 20/79  soft-tissue]
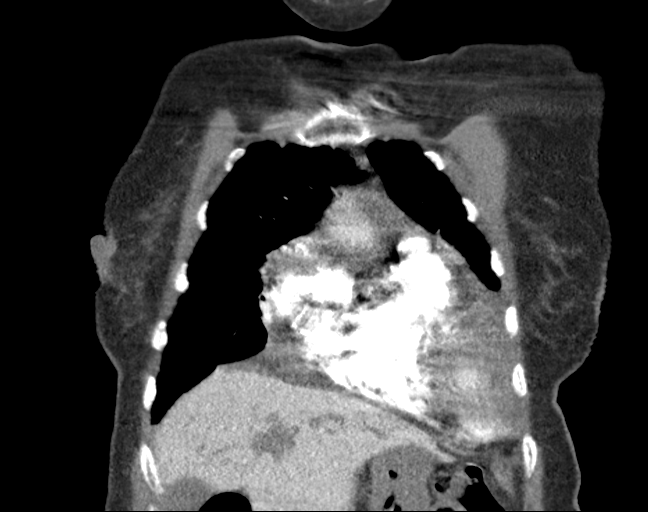
[im 40/79  soft-tissue]
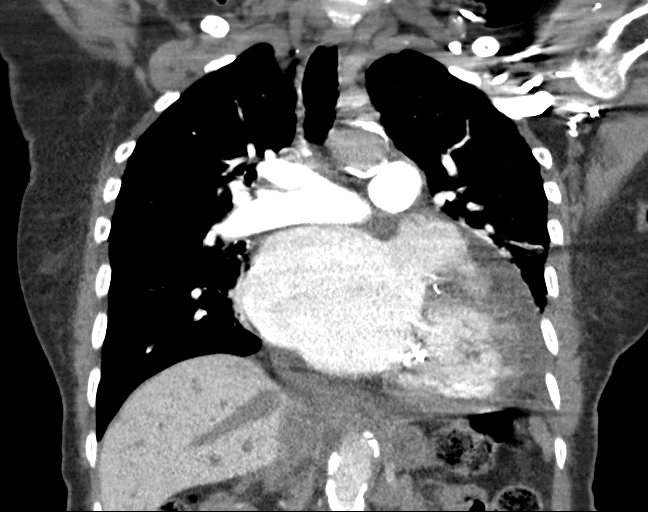
[im 59/79  soft-tissue]
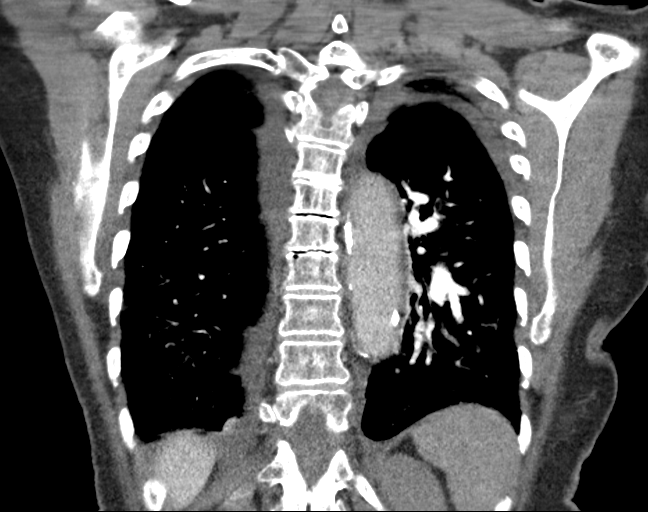

[18 of 46 positions shown; findings below may reference images not displayed]

FINDINGS: Cardiovascular: Satisfactory opacification of the pulmonary arteries
to the segmental level. No evidence of pulmonary embolism. Mild
cardiomegaly is noted. Atherosclerosis of thoracic aorta is noted
without aneurysm formation. No pericardial effusion.

Mediastinum/Nodes: No enlarged mediastinal, hilar, or axillary lymph
nodes. Thyroid gland, trachea, and esophagus demonstrate no
significant findings.

Lungs/Pleura: No pneumothorax is noted. Small bilateral pleural
effusions are noted with mild bibasilar subsegmental atelectasis.

Upper Abdomen: No acute abnormality.

Musculoskeletal: No chest wall abnormality. No acute or significant
osseous findings.

Review of the MIP images confirms the above findings.
IMPRESSION: 1. No definite evidence of pulmonary embolus.
2. Small bilateral pleural effusions are noted with mild bibasilar
subsegmental atelectasis.
3. Aortic atherosclerosis.

Aortic Atherosclerosis ([id number]).

## 2021-06-17 IMAGING — CR DG CHEST 2V
1 series · 2 of 2 positions shown · non-contrast
Comparison: 11/30/2018

CLINICAL DATA: Short of breath

EXAM:
CHEST - 2 VIEW

[Series 1: dg chest 2 view · 0.14mm/px · 2 of 2 slices shown]
[im 1/2]
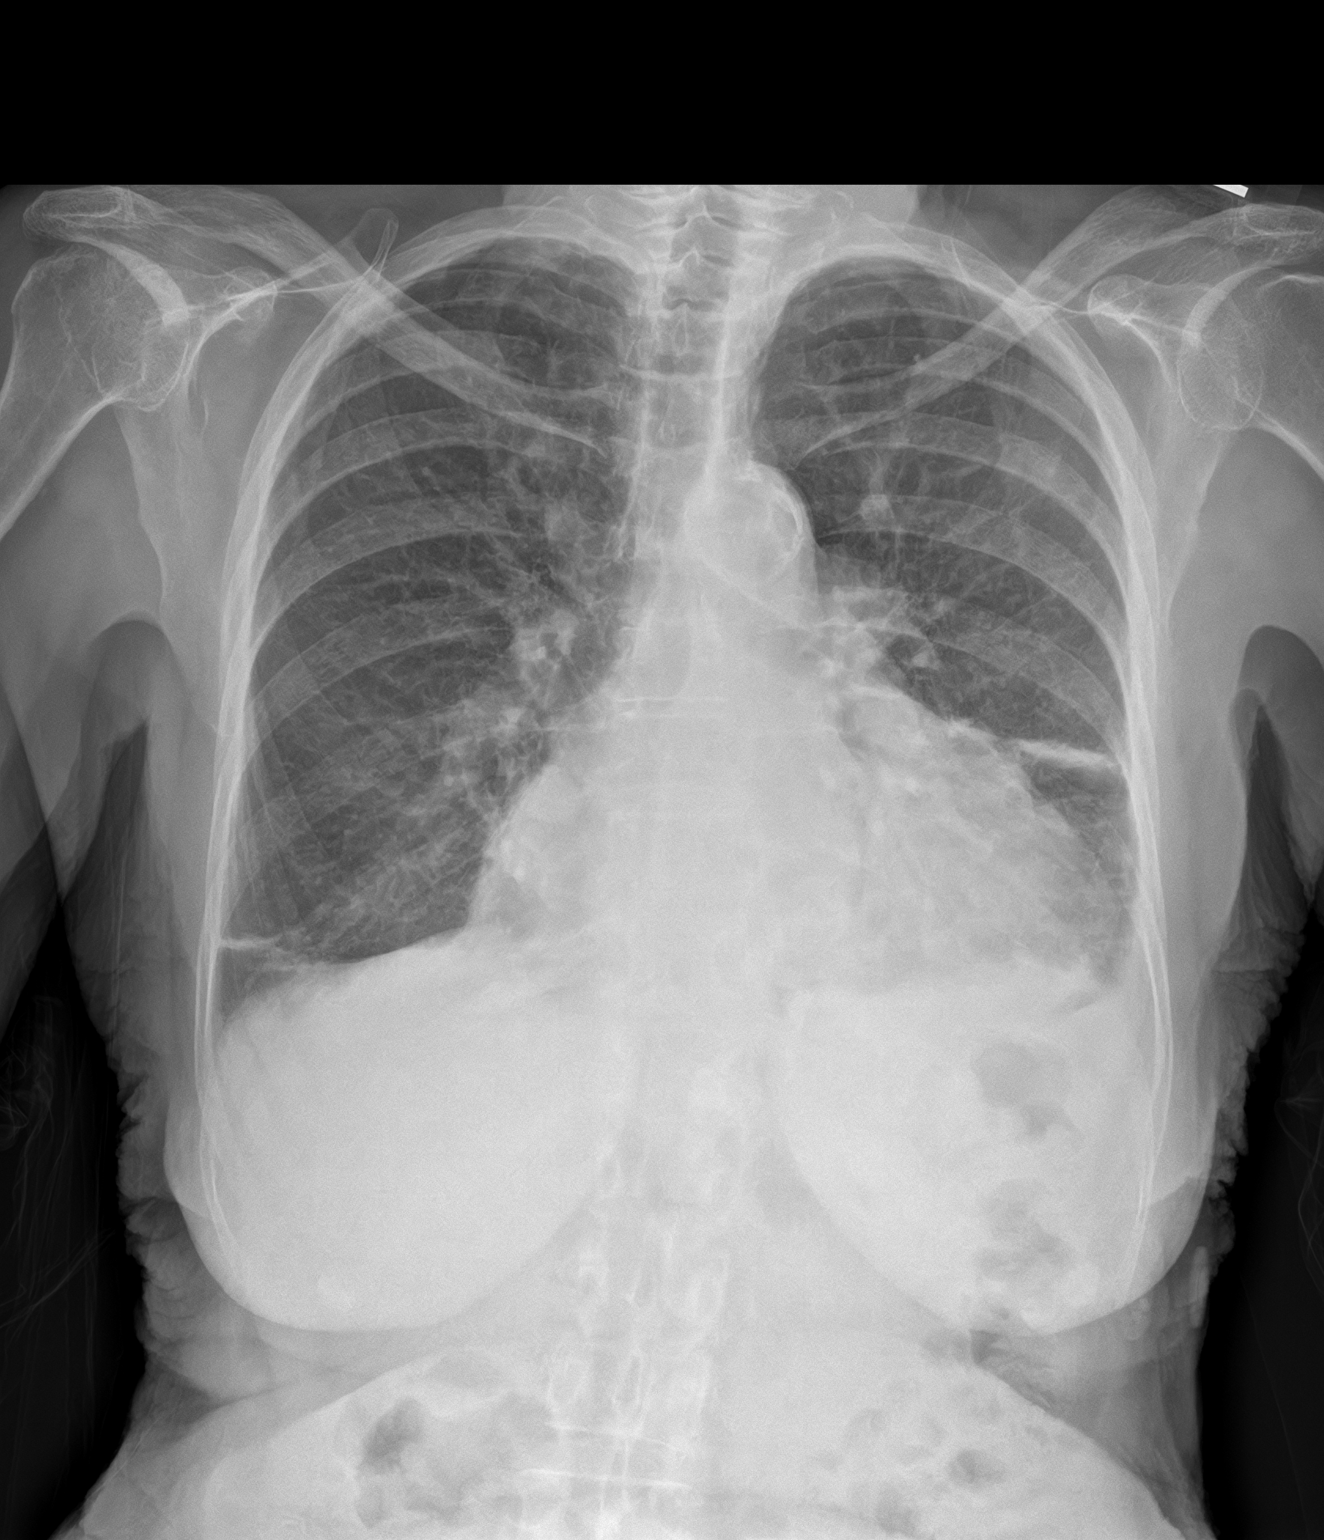
[im 2/2]
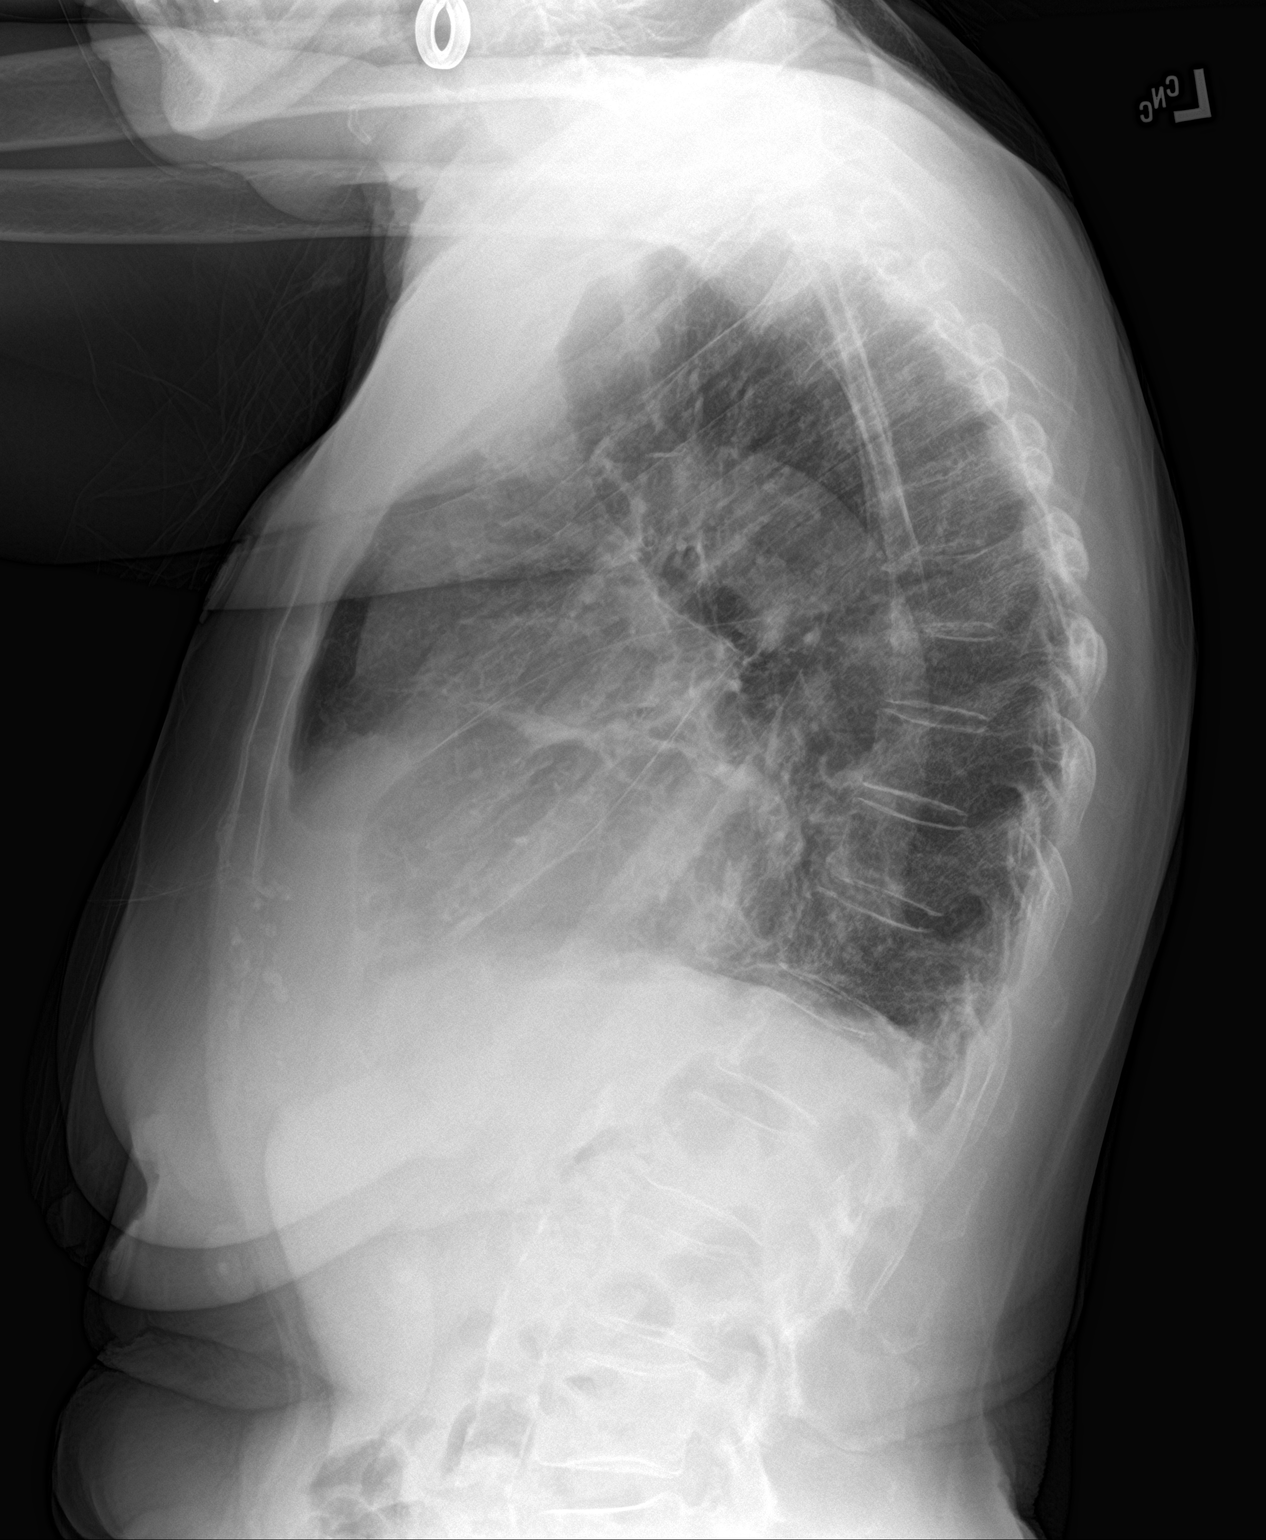

[2 of 2 positions shown; findings below may reference images not displayed]

FINDINGS: Cardiac enlargement with normal vascularity. Negative for edema.
Small left pleural effusion. Mild linear densities in lung bases
consistent with atelectasis. No focal pneumonia.
IMPRESSION: Mild bibasilar atelectasis and small left effusion. Negative for
heart failure or pneumonia.
